# Patient Record
Sex: Female | Born: 1935 | Race: Black or African American | Hispanic: No | Smoking: Never smoker
Health system: Southern US, Community
[De-identification: ages and names within clinical notes are randomized; demographics above are authoritative.]

## PROBLEM LIST (undated history)

## (undated) DIAGNOSIS — K5790 Diverticulosis of intestine, part unspecified, without perforation or abscess without bleeding: Secondary | ICD-10-CM

## (undated) DIAGNOSIS — G629 Polyneuropathy, unspecified: Secondary | ICD-10-CM

## (undated) DIAGNOSIS — K2960 Other gastritis without bleeding: Secondary | ICD-10-CM

## (undated) DIAGNOSIS — N390 Urinary tract infection, site not specified: Secondary | ICD-10-CM

## (undated) DIAGNOSIS — I749 Embolism and thrombosis of unspecified artery: Secondary | ICD-10-CM

## (undated) DIAGNOSIS — I639 Cerebral infarction, unspecified: Secondary | ICD-10-CM

## (undated) DIAGNOSIS — F0150 Vascular dementia without behavioral disturbance: Secondary | ICD-10-CM

## (undated) DIAGNOSIS — R627 Adult failure to thrive: Secondary | ICD-10-CM

## (undated) DIAGNOSIS — F3289 Other specified depressive episodes: Secondary | ICD-10-CM

## (undated) DIAGNOSIS — R1311 Dysphagia, oral phase: Secondary | ICD-10-CM

## (undated) DIAGNOSIS — E1149 Type 2 diabetes mellitus with other diabetic neurological complication: Secondary | ICD-10-CM

## (undated) DIAGNOSIS — M6281 Muscle weakness (generalized): Secondary | ICD-10-CM

## (undated) DIAGNOSIS — I63532 Cerebral infarction due to unspecified occlusion or stenosis of left posterior cerebral artery: Secondary | ICD-10-CM

## (undated) DIAGNOSIS — K449 Diaphragmatic hernia without obstruction or gangrene: Secondary | ICD-10-CM

## (undated) DIAGNOSIS — R1314 Dysphagia, pharyngoesophageal phase: Secondary | ICD-10-CM

## (undated) DIAGNOSIS — I509 Heart failure, unspecified: Secondary | ICD-10-CM

## (undated) DIAGNOSIS — S065X9A Traumatic subdural hemorrhage with loss of consciousness of unspecified duration, initial encounter: Secondary | ICD-10-CM

## (undated) DIAGNOSIS — I10 Essential (primary) hypertension: Secondary | ICD-10-CM

## (undated) DIAGNOSIS — F329 Major depressive disorder, single episode, unspecified: Secondary | ICD-10-CM

## (undated) DIAGNOSIS — E782 Mixed hyperlipidemia: Secondary | ICD-10-CM

## (undated) DIAGNOSIS — I6789 Other cerebrovascular disease: Secondary | ICD-10-CM

## (undated) DIAGNOSIS — R4182 Altered mental status, unspecified: Secondary | ICD-10-CM

## (undated) DIAGNOSIS — H53469 Homonymous bilateral field defects, unspecified side: Secondary | ICD-10-CM

## (undated) DIAGNOSIS — D649 Anemia, unspecified: Secondary | ICD-10-CM

## (undated) HISTORY — PX: CHOLECYSTECTOMY: SHX55

## (undated) HISTORY — PX: TONSILLECTOMY: SUR1361

## (undated) HISTORY — PX: APPENDECTOMY: SHX54

## (undated) HISTORY — PX: ABDOMINAL HYSTERECTOMY: SHX81

---

## 1999-02-17 ENCOUNTER — Ambulatory Visit (HOSPITAL_COMMUNITY): Admission: EM | Admit: 1999-02-17 | Discharge: 1999-02-17 | Payer: Self-pay

## 2001-03-04 ENCOUNTER — Ambulatory Visit (HOSPITAL_COMMUNITY): Admission: RE | Admit: 2001-03-04 | Discharge: 2001-03-04 | Payer: Self-pay | Admitting: Family Medicine

## 2001-03-04 ENCOUNTER — Encounter: Payer: Self-pay | Admitting: Family Medicine

## 2001-05-06 ENCOUNTER — Ambulatory Visit (HOSPITAL_COMMUNITY): Admission: RE | Admit: 2001-05-06 | Discharge: 2001-05-06 | Payer: Self-pay | Admitting: Ophthalmology

## 2001-07-21 ENCOUNTER — Encounter: Payer: Self-pay | Admitting: Family Medicine

## 2001-07-21 ENCOUNTER — Ambulatory Visit (HOSPITAL_COMMUNITY): Admission: RE | Admit: 2001-07-21 | Discharge: 2001-07-21 | Payer: Self-pay | Admitting: Family Medicine

## 2001-07-23 ENCOUNTER — Ambulatory Visit (HOSPITAL_COMMUNITY): Admission: RE | Admit: 2001-07-23 | Discharge: 2001-07-23 | Payer: Self-pay | Admitting: Family Medicine

## 2001-07-23 ENCOUNTER — Encounter: Payer: Self-pay | Admitting: Family Medicine

## 2001-09-29 ENCOUNTER — Ambulatory Visit (HOSPITAL_COMMUNITY): Admission: RE | Admit: 2001-09-29 | Discharge: 2001-09-29 | Payer: Self-pay | Admitting: Family Medicine

## 2001-09-29 ENCOUNTER — Encounter: Payer: Self-pay | Admitting: Family Medicine

## 2001-11-22 ENCOUNTER — Encounter: Payer: Self-pay | Admitting: Emergency Medicine

## 2001-11-22 ENCOUNTER — Emergency Department (HOSPITAL_COMMUNITY): Admission: EM | Admit: 2001-11-22 | Discharge: 2001-11-22 | Payer: Self-pay | Admitting: Emergency Medicine

## 2002-05-31 ENCOUNTER — Encounter: Payer: Self-pay | Admitting: *Deleted

## 2002-05-31 ENCOUNTER — Emergency Department (HOSPITAL_COMMUNITY): Admission: EM | Admit: 2002-05-31 | Discharge: 2002-06-01 | Payer: Self-pay | Admitting: *Deleted

## 2002-08-19 ENCOUNTER — Encounter: Payer: Self-pay | Admitting: Internal Medicine

## 2002-08-19 ENCOUNTER — Ambulatory Visit (HOSPITAL_COMMUNITY): Admission: RE | Admit: 2002-08-19 | Discharge: 2002-08-19 | Payer: Self-pay | Admitting: Internal Medicine

## 2003-08-23 ENCOUNTER — Ambulatory Visit (HOSPITAL_COMMUNITY): Admission: RE | Admit: 2003-08-23 | Discharge: 2003-08-23 | Payer: Self-pay | Admitting: Family Medicine

## 2003-10-12 ENCOUNTER — Ambulatory Visit (HOSPITAL_COMMUNITY): Admission: RE | Admit: 2003-10-12 | Discharge: 2003-10-12 | Payer: Self-pay | Admitting: Family Medicine

## 2004-01-12 ENCOUNTER — Ambulatory Visit (HOSPITAL_COMMUNITY): Admission: RE | Admit: 2004-01-12 | Discharge: 2004-01-12 | Payer: Self-pay | Admitting: General Surgery

## 2004-02-28 ENCOUNTER — Ambulatory Visit (HOSPITAL_COMMUNITY): Admission: RE | Admit: 2004-02-28 | Discharge: 2004-02-28 | Payer: Self-pay | Admitting: Ophthalmology

## 2004-06-12 ENCOUNTER — Ambulatory Visit (HOSPITAL_COMMUNITY): Admission: RE | Admit: 2004-06-12 | Discharge: 2004-06-12 | Payer: Self-pay | Admitting: Family Medicine

## 2004-10-09 ENCOUNTER — Ambulatory Visit (HOSPITAL_COMMUNITY): Admission: RE | Admit: 2004-10-09 | Discharge: 2004-10-09 | Payer: Self-pay | Admitting: Family Medicine

## 2005-02-27 ENCOUNTER — Ambulatory Visit (HOSPITAL_COMMUNITY): Admission: RE | Admit: 2005-02-27 | Discharge: 2005-02-27 | Payer: Self-pay | Admitting: *Deleted

## 2005-04-03 ENCOUNTER — Ambulatory Visit (HOSPITAL_COMMUNITY): Admission: RE | Admit: 2005-04-03 | Discharge: 2005-04-03 | Payer: Self-pay | Admitting: General Surgery

## 2005-08-29 ENCOUNTER — Ambulatory Visit (HOSPITAL_COMMUNITY): Admission: RE | Admit: 2005-08-29 | Discharge: 2005-08-29 | Payer: Self-pay | Admitting: Family Medicine

## 2005-11-30 ENCOUNTER — Ambulatory Visit (HOSPITAL_COMMUNITY): Admission: RE | Admit: 2005-11-30 | Discharge: 2005-11-30 | Payer: Self-pay | Admitting: Family Medicine

## 2006-12-03 ENCOUNTER — Ambulatory Visit (HOSPITAL_COMMUNITY): Admission: RE | Admit: 2006-12-03 | Discharge: 2006-12-03 | Payer: Self-pay | Admitting: Family Medicine

## 2006-12-16 ENCOUNTER — Ambulatory Visit (HOSPITAL_COMMUNITY): Admission: RE | Admit: 2006-12-16 | Discharge: 2006-12-16 | Payer: Self-pay | Admitting: Family Medicine

## 2007-06-13 ENCOUNTER — Emergency Department (HOSPITAL_COMMUNITY): Admission: EM | Admit: 2007-06-13 | Discharge: 2007-06-13 | Payer: Self-pay | Admitting: Emergency Medicine

## 2007-08-11 ENCOUNTER — Inpatient Hospital Stay (HOSPITAL_COMMUNITY): Admission: EM | Admit: 2007-08-11 | Discharge: 2007-08-19 | Payer: Self-pay | Admitting: Emergency Medicine

## 2007-08-12 ENCOUNTER — Ambulatory Visit: Payer: Self-pay | Admitting: *Deleted

## 2007-08-12 ENCOUNTER — Encounter (INDEPENDENT_AMBULATORY_CARE_PROVIDER_SITE_OTHER): Payer: Self-pay | Admitting: Emergency Medicine

## 2007-08-14 ENCOUNTER — Encounter (INDEPENDENT_AMBULATORY_CARE_PROVIDER_SITE_OTHER): Payer: Self-pay | Admitting: Internal Medicine

## 2007-12-17 ENCOUNTER — Ambulatory Visit (HOSPITAL_COMMUNITY): Admission: RE | Admit: 2007-12-17 | Discharge: 2007-12-17 | Payer: Self-pay | Admitting: Family Medicine

## 2009-01-23 ENCOUNTER — Emergency Department (HOSPITAL_COMMUNITY): Admission: EM | Admit: 2009-01-23 | Discharge: 2009-01-23 | Payer: Self-pay | Admitting: Emergency Medicine

## 2009-04-13 ENCOUNTER — Ambulatory Visit (HOSPITAL_COMMUNITY): Admission: RE | Admit: 2009-04-13 | Discharge: 2009-04-13 | Payer: Self-pay | Admitting: Family Medicine

## 2009-05-30 ENCOUNTER — Emergency Department (HOSPITAL_COMMUNITY): Admission: EM | Admit: 2009-05-30 | Discharge: 2009-05-30 | Payer: Self-pay | Admitting: Emergency Medicine

## 2009-05-31 ENCOUNTER — Emergency Department (HOSPITAL_COMMUNITY): Admission: EM | Admit: 2009-05-31 | Discharge: 2009-06-01 | Payer: Self-pay | Admitting: Emergency Medicine

## 2010-04-28 ENCOUNTER — Emergency Department (HOSPITAL_COMMUNITY)
Admission: EM | Admit: 2010-04-28 | Discharge: 2010-04-28 | Payer: Self-pay | Source: Home / Self Care | Admitting: Emergency Medicine

## 2010-09-13 LAB — BASIC METABOLIC PANEL
CO2: 26 mEq/L (ref 19–32)
Calcium: 10.1 mg/dL (ref 8.4–10.5)
Creatinine, Ser: 0.81 mg/dL (ref 0.4–1.2)
GFR calc Af Amer: >60 mL/min (ref >60)
Glucose, Bld: 105 mg/dL — ABNORMAL HIGH (ref 70–99)

## 2010-09-13 LAB — CBC
MCH: 30.4 pg (ref 26.0–34.0)
MCHC: 33.3 g/dL (ref 30.0–36.0)
Platelets: 191 10*3/uL (ref 150–400)
RBC: 4.02 MIL/uL (ref 3.87–5.11)

## 2010-10-03 LAB — COMPREHENSIVE METABOLIC PANEL
AST: 23 U/L (ref 0–37)
Albumin: 4.3 g/dL (ref 3.5–5.2)
Calcium: 9.8 mg/dL (ref 8.4–10.5)
Creatinine, Ser: 0.87 mg/dL (ref 0.4–1.2)
GFR calc Af Amer: >60 mL/min (ref >60)
GFR calc non Af Amer: >60 mL/min (ref >60)

## 2010-10-03 LAB — CK TOTAL AND CKMB (NOT AT ARMC)
CK, MB: 1.6 ng/mL (ref 0.3–4.0)
Relative Index: INVALID (ref 0.0–2.5)
Total CK: 97 U/L (ref 7–177)

## 2010-10-03 LAB — URINE MICROSCOPIC-ADD ON

## 2010-10-03 LAB — CBC
Platelets: 180 10*3/uL (ref 150–400)
WBC: 5.7 10*3/uL (ref 4.0–10.5)

## 2010-10-03 LAB — URINALYSIS, ROUTINE W REFLEX MICROSCOPIC
Glucose, UA: NEGATIVE mg/dL
Leukocytes, UA: NEGATIVE
Nitrite: POSITIVE — AB
Protein, ur: NEGATIVE mg/dL

## 2010-10-03 LAB — URINE CULTURE
Colony Count: NO GROWTH
Culture: NO GROWTH

## 2010-10-03 LAB — DIFFERENTIAL
Eosinophils Relative: 2 % (ref 0–5)
Lymphocytes Relative: 28 % (ref 12–46)
Lymphs Abs: 1.6 10*3/uL (ref 0.7–4.0)
Monocytes Absolute: 0.5 10*3/uL (ref 0.1–1.0)

## 2010-10-03 LAB — SEDIMENTATION RATE: Sed Rate: 15 mm/hr (ref 0–22)

## 2010-10-04 LAB — HEPATIC FUNCTION PANEL
ALT: 13 U/L (ref 0–35)
Bilirubin, Direct: 0.1 mg/dL (ref 0.0–0.3)
Indirect Bilirubin: 0.6 mg/dL (ref 0.3–0.9)

## 2010-10-04 LAB — CBC
HCT: 39 % (ref 36.0–46.0)
Hemoglobin: 13 g/dL (ref 12.0–15.0)
MCHC: 33.2 g/dL (ref 30.0–36.0)
MCV: 91.8 fL (ref 78.0–100.0)
RBC: 4.25 MIL/uL (ref 3.87–5.11)
RDW: 13.9 % (ref 11.5–15.5)
WBC: 4.5 10*3/uL (ref 4.0–10.5)

## 2010-10-04 LAB — URINALYSIS, ROUTINE W REFLEX MICROSCOPIC
Bilirubin Urine: NEGATIVE
Nitrite: NEGATIVE
Specific Gravity, Urine: 1.015 (ref 1.005–1.030)
Urobilinogen, UA: 0.2 mg/dL (ref 0.0–1.0)

## 2010-10-04 LAB — DIFFERENTIAL
Basophils Absolute: 0 10*3/uL (ref 0.0–0.1)
Basophils Relative: 1 % (ref 0–1)
Eosinophils Absolute: 0.1 10*3/uL (ref 0.0–0.7)
Eosinophils Relative: 3 % (ref 0–5)
Monocytes Absolute: 0.4 10*3/uL (ref 0.1–1.0)

## 2010-10-04 LAB — BASIC METABOLIC PANEL
Calcium: 9.9 mg/dL (ref 8.4–10.5)
Chloride: 103 mEq/L (ref 96–112)
Creatinine, Ser: 0.76 mg/dL (ref 0.4–1.2)
GFR calc Af Amer: >60 mL/min (ref >60)
GFR calc non Af Amer: >60 mL/min (ref >60)

## 2010-10-08 LAB — URINALYSIS, ROUTINE W REFLEX MICROSCOPIC
Hgb urine dipstick: NEGATIVE
Nitrite: NEGATIVE
Specific Gravity, Urine: 1.01 (ref 1.005–1.030)
Urobilinogen, UA: 0.2 mg/dL (ref 0.0–1.0)
pH: 6 (ref 5.0–8.0)

## 2010-10-08 LAB — COMPREHENSIVE METABOLIC PANEL
ALT: 12 U/L (ref 0–35)
AST: 23 U/L (ref 0–37)
CO2: 24 mEq/L (ref 19–32)
Calcium: 9.3 mg/dL (ref 8.4–10.5)
GFR calc Af Amer: 55 mL/min — ABNORMAL LOW (ref >60)
Sodium: 135 mEq/L (ref 135–145)
Total Protein: 6.9 g/dL (ref 6.0–8.3)

## 2010-10-08 LAB — DIFFERENTIAL
Eosinophils Absolute: 0.1 10*3/uL (ref 0.0–0.7)
Eosinophils Relative: 1 % (ref 0–5)
Lymphs Abs: 0.7 10*3/uL (ref 0.7–4.0)
Monocytes Absolute: 0.5 10*3/uL (ref 0.1–1.0)
Monocytes Relative: 9 % (ref 3–12)

## 2010-10-08 LAB — LIPASE, BLOOD: Lipase: 11 U/L (ref 11–59)

## 2010-10-08 LAB — CBC
MCHC: 34.5 g/dL (ref 30.0–36.0)
RBC: 3.83 MIL/uL — ABNORMAL LOW (ref 3.87–5.11)
RDW: 14.5 % (ref 11.5–15.5)

## 2010-11-14 NOTE — H&P (Signed)
[name], MAST NO.:  [id number]   MEDICAL RECORD NO.:  [id number]          PATIENT TYPE:  EMS   LOCATION:  ED                            FACILITY:  [facility]   PHYSICIAN:  Renee Ramus, MD       DATE OF BIRTH:  [birth year]   DATE OF ADMISSION:  01/23/2009  DATE OF DISCHARGE:  07/25/2010LH                              HISTORY & PHYSICAL   HISTORY OF PRESENT ILLNESS:  The patient is an 77 year old female, who  experienced a syncopal episode or near syncopal episode 1 day prior to  admission.  The patient has been experiencing diarrhea for approximately  1-2 weeks prior to admission.  The patient has had decreased p.o. intake  and she is dehydrated upon being seen in the emergency department.  The  patient denies fevers, chills, night sweats, nausea, vomiting, chest  pain, shortness breath, PND, or orthopnea.  The patient has received IV  fluids and now feels stable for discharge.   PAST MEDICAL HISTORY:  1. Diabetes mellitus type 2.  2. Stroke history.  3. Hypertension.  4. Neuropathy.  5. Anemia.  6. Vertigo.  7. Esophageal reflux disease.  8. Hyperlipidemia.   SOCIAL HISTORY:  The patient denies alcohol or tobacco use.   FAMILY HISTORY:  Not available.   REVIEW OF SYSTEMS:  All other comprehensive review systems are negative.   ALLERGIES:  The patient is allergic to CODEINE and PENICILLIN.   CURRENT MEDICATIONS:  1. Prilosec 20 mg p.o. daily.  2. Meclizine 25 mg p.o. q.6 h. p.r.n. vertigo.  3. Altace 2.5 mg p.o. daily.  4. Lomotil 4 mg p.o. p.r.n. loose stool.  5. Actos 30 mg p.o. daily.  6. Aggrenox 1 tablet p.o. b.i.d.  7. Librax p.o. p.r.n. stools.  8. Gabapentin 300 mg p.o. nightly.   PHYSICAL EXAMINATION:  GENERAL:  Well-developed, well-nourished, elderly  white female, currently in no apparent distress.  VITAL SIGNS:  Blood pressure 112/42, heart rate 65, respiratory 22,  temperature 97.4 with 100% saturated on room air.  HEENT:  No  jugular venous distention or lymphadenopathy.  Oropharynx is  clear.  Mucous membranes are pink and moist.  TMs clear bilaterally.  Pupils are equal, reactive to light and accommodation.  Extraocular  muscles are intact.  CARDIOVASCULAR:  Regular rate and rhythm without murmurs, rubs, or  gallops.  PULMONARY:  Lungs are clear to auscultation bilaterally.  ABDOMEN:  Soft, nontender, nondistended without hepatosplenomegaly.  Bowel sounds present.  She has no rebound or guarding.  EXTREMITIES:  She has no clubbing, cyanosis, or edema.  She has good peripheral pulses  in dorsalis pedis and radial arteries.  She is able to move all  extremities.  NEUROLOGIC:  Cranial nerves II-XII are grossly intact.  She has no focal  neurological deficits.   STUDIES:  1. EKG shows normal sinus rhythm.  2. CT of the head shows no acute disease.  3. Plain film of the sacral and coccyx shows no acute fractures or      disease.  4. Lumbar spine film shows no acute fractures.   LABORATORY  DATA:  White count 5.6, H and H 11.8 and 34.2, MCV 89, and  platelets 150.  Sodium 136, potassium 3.3, chloride 103, bicarb 24, BUN  14, creatinine 1.14 with baseline creatinine of 0.6, and glucose 109.  UA is clear.   ASSESSMENT AND PLAN:  1. Near syncopal episode likely the patient is suffering prerenal      dehydration from decreased p.o. intake and exemplified by her      increased creatinine.  The patient has been has received fluid.      She is now stable.  This has been precipitated by her diarrhea,      which we do not believe is infectious.  We will ask her to stop      taking her Actos, Altace, and Protonix, but continue Aggrenox and      Neurontin and Lomotil.  We asked her to follow up with primary care      physician within 1-2 weeks.  2. Diabetes mellitus as above.  3. Hypertension.  We will hold Altace currently.  4. Gastroesophageal reflux disease.  We will hold Protonix.  5. Stroke history.  We will  continue with Aggrenox therapy.  6. Dehydration, now resolved.  7. Hyperlipidemia, currently stable.   DISPOSITION:  We believe the patient is stable for discharge and will be  returned to home.  H and P was constructed by reviewing past medical  history, conferring with emergency medical room physician, reviewing the  emergency medical record.   TIME SPENT:  One hour.      Renee Ramus, MD  Electronically Signed     JF/MEDQ  D:  01/23/2009  T:  01/24/2009  Job:  [id number]   cc:   Patrica Duel, M.D.  Fax: [phone]

## 2010-11-14 NOTE — Discharge Summary (Signed)
[name], [name] NO.:  [id number]   MEDICAL RECORD NO.:  [id number]          PATIENT TYPE:  INP   LOCATION:  6735                         FACILITY:  [facility]   PHYSICIAN:  Herbie Saxon, MDDATE OF BIRTH:  [birth year]   DATE OF ADMISSION:  08/11/2007  DATE OF DISCHARGE:                               DISCHARGE SUMMARY   PRIMARY CARE PHYSICIAN:  Patrica Duel, M.D., [facility].   RADIOLOGY:  CT of the brain of August 11, 2007 showed no acute  intracranial abnormality.  The MRI of the brain of August 11, 2007  shows a 2-3 mm acute stroke in the left posterior upper pons, negative  left superior cerebellarpeduncle.  Extensive chronic small vessel  changes elsewhere in the pons, the thalamus, and the hemispheric white  matter.   DISCHARGE DIAGNOSES:  1. Acute pontine cerebrovascular accident.  2. Hypertension, stable.  3. Diabetes, stable.  4. Unstable gait.  5. Urinary tract infection.  6. Hyperlipidemia.  7. Degenerative disk disease, bilateral hips.  8. Chronic anemia.  9. History of diverticulosis.  10.Syncopal episode.  11.Urine culture was positive for Proteus.   HOSPITAL COURSE:  Patient is an 77 year old African-American lady,  passed out while visiting her son in the intensive care unit at [facility].  She was worked up for syncope, and the MRI brain did  show an acute pontine infarct.  Patient also had evidence of urinary  tract infection on presentation.  She was started on IV Rocephin.  Her  carotid Doppler showed only 40-60% ICA stenosis.  Patient had been  ambulating with physical therapy support.  Urine cultures elicited  showed Proteus greater than 100,000 colonies.  Hip and low back pain has  been improved with p.r.n. analgesia and heating pad.  Echocardiogram  showed no embolic source of the CVA.   DISCHARGE CONDITION:  Stable.   DISPOSITION:  To the nursing facility in [facility]   ACTIVITY:  To be increased slowly.  Walk with assistance or with the  walker.   DIET:  Low sodium, heart-healthy, low cholesterol, 1800-calorie ADA.   FOLLOW UP:  1. With the nursing home physician or Dr. Patrica Duel of [facility] in a week.  2. Follow up with the neurologist, Dr. Vickey Huger, as an outpatient in 3-      4 weeks.   DISCHARGE MEDICATIONS:  1. Aggrenox 1 tablet b.i.d.  2. Lipitor 20 mg daily.  3. Cipro 500 mg b.i.d. for 3 more days.  4. Percocet 5/325 mg 1 q.6h. p.r.n.  5. Flexeril 80 mg p.o. t.i.d. p.r.n.  6. Maalox 30 ml q.6h. p.r.n.  7. Prilosec 20 mg daily.  8. Accu-Cheks q.a.c. and nightly.  9. Sliding scale insulin NovoLog coverage, sensitivity was pretty low      scale,  10.Altace 2.5 mg alt. daily.  Hold if blood pressure is less than      120/80.  11.Actos 15 mg alt.  daily.  Hold if blood glucose is less than 80      mg/dl.   PHYSICAL EXAMINATION:  This is an elderly lady not in acute distress.  Temperature 98, pulse 68, respiratory rate 20, blood pressure 117/58.  Pupils are equal, round and reactive to light and accommodation.  Head  is normocephalic and atraumatic.  She is mildly pale, not jaundiced.  Oropharynx and nasopharynx are clear.  There is no elevated JVD or carotid bruits.  No thyromegaly.  Heart sounds S1 and S2, regular.  CHEST:  Clinically clear.  ABDOMEN:  Benign.  She is alert and oriented x3.  Power is 4+ globally.  Deep tendon  reflexes are 2+ globally.  Peripheral pulses are present.  No peripheral  edema.  Cranial nerves II-XII are intact.  No facial asymmetry.  No  tremors, no dysarthria.   AVAILABLE LABS:  WBC 5, hematocrit 34, platelet count 157.   Echocardiogram shows a ejection fraction of 50% to 60%.   Hemoglobin A1C 6.4.  Sodium 141, potassium 3.5, chloride 109,  bicarbonate 24, glucose 86, BUN 4, creatinine 0.6.  Homocysteine is 7.9.  The total cholesterol is 215,  triglycerides 95, HDL 34, LDL  cholesterol  536.      Herbie Saxon, MD  Electronically Signed     MIO/MEDQ  D:  08/18/2007  T:  08/19/2007  Job:  [id number]   cc:   Melvyn Novas, M.D.  Patrica Duel, M.D.

## 2010-11-14 NOTE — H&P (Signed)
[name], [name] NO.:  [id number]   MEDICAL RECORD NO.:  [id number]          PATIENT TYPE:  INP   LOCATION:  1824                         FACILITY:  [facility]   PHYSICIAN:  Della Goo, M.D. DATE OF BIRTH:  [birth year]   DATE OF ADMISSION:  08/11/2007  DATE OF DISCHARGE:                              HISTORY & PHYSICAL   PRIMARY CARE PHYSICIAN:  Unassigned.   CHIEF COMPLAINT:  Passed out.   HISTORY OF PRESENT ILLNESS:  This is an 77 year old female who was seen  in the emergency department after she passed out while visiting her son  in the intensive care unit at [facility].  She was brought down  to the emergency department for further evaluation.  The patient's  daughter is at bedside and reports that the patient was seated and then  leaned back and was unresponsive.  This only lasted a few seconds.  The  patient denies having any chest pain, shortness of breath.  She also  denies having any fever, chills.   PAST MEDICAL HISTORY:  1. Hypertension.  2. Diabetes type 2.  3. Diverticulitis.   PAST SURGICAL HISTORY:  1. Cholecystectomy.  2. Total abdominal hysterectomy.  3. Bladder tacking times three.   MEDICATIONS:  Need to be further verified, they are unable to give the  dosages.  She is on Benicar, Actos, Prilosec and aspirin therapy.   ALLERGIES:  CODEINE AND PENICILLIN.   SOCIAL HISTORY:  She lives alone, reports being able to perform her  daily activities of life.  Nonsmoker, nondrinker.   FAMILY HISTORY:  Noncontributory.   PHYSICAL EXAMINATION:  GENERAL:  This is an 77 year old thin, well-  developed female in no acute distress currently.  VITAL SIGNS:  Temperature 97.0, blood pressure 143/67, heart rate 57-62,  respirations 23.  O2 saturation 97-100%.  HEENT:  Normocephalic, atraumatic.  Pupils equal, round and reactive to  light.  Extraocular movements intact.  Funduscopic benign.  Oropharynx  is clear.  NECK:  Supple.   Full range of motion.  No thyromegaly, adenopathy,  jugular venous distention.  CARDIOVASCULAR:  Mild bradycardia.  No murmurs, gallops, rubs.  LUNGS:  Clear to auscultation bilaterally.  ABDOMEN:  Positive bowel sounds, soft, nontender, nondistended.  EXTREMITIES:  Without cyanosis, clubbing or edema.  NEUROLOGICAL:  Alert and oriented x3.  Mild confusion, however.  Speech  is clear.  There are no cranial nerve deficits.  No motor or sensory  function deficits.   LABORATORY DATA:  White blood cell count 7.2, hemoglobin 11.5,  hematocrit 34.5, platelets 184,000.  Neutrophils 40%, lymphocytes 48%.  Sodium 136, potassium 3.8, chloride 104, bicarbonate 24.4, BUN 7,  creatinine 0.9, glucose 131.  Cardiac enzymes with a myoglobin of 30.4.  CK MB less than 1.0 and troponin less than 0.05.  Urine drug screen  performed, and this returned positive for benzodiazepines.  However, the  patient denies any history of taking any nerve or sleep medication, so  the urine drug screens will be repeated.  Urinalysis positive for  leukocytes and for nitrites.  CT scan of the head negative for any  acute  findings.   ASSESSMENT:  An 77 year old female being admitted with:  1. Syncope/altered mental status.  2. Urinary tract infection.  3. Hypertension.  4. Mild anemia.   PLAN:  The patient will be admitted to the telemetry area for cardiac  monitoring and cardiac enzymes will be continued.  The patient will be  placed on IV antibiotic therapy for her urinary tract infection.  Cipro  has been started.  Her home medications will be further verified.  A  syncope workup will also be initiated.  A TSH has been ordered, and an  MRI and carotid ultrasound study has been ordered.  Further workup will  ensue patient's critical condition and progress.  Her symptoms may have  been secondary to a urinary tract infection.  The UT and GI prophylaxis  have been ordered.      Della Goo, M.D.   Electronically Signed     HJ/MEDQ  D:  08/11/2007  T:  08/11/2007  Job:  [id number]

## 2010-11-17 NOTE — Procedures (Signed)
[facility]  Patient:    [name], [name] Visit Number: [id number] MRN: [id number]          Service Type: Attending:  Kari Baars, M.D. Dictated by:   Kari Baars, M.D. Proc. Date: 11/22/01                            EKG Interpretations  TIME:  2110  INTERPRETATION:  The rhythm is a sinus rhythm with a rate in the 60s.  The axis is leftward but does not meet criteria for left axis deviation.  There is generally low voltage throughout the tracing.  IMPRESSION:  Abnormal electrocardiogram. Dictated by:   Kari Baars, M.D. Attending:  Kari Baars, M.D. DD:  12/21/01 TD:  01/23/02 Job: [id number]

## 2010-11-17 NOTE — Op Note (Signed)
NAME:  [name], [name]                       ACCOUNT NO.:  [id number]   MEDICAL RECORD NO.:  [id number]                   PATIENT TYPE:  AMB   LOCATION:  DAY                                  FACILITY:  [facility]   PHYSICIAN:  Dalia Heading, M.D.               DATE OF BIRTH:  February 11, 1923   DATE OF PROCEDURE:  01/12/2004  DATE OF DISCHARGE:                                 OPERATIVE REPORT   PREOPERATIVE DIAGNOSES:  1. Skin nevus, right breast.  2. Pilonidal cyst.   POSTOPERATIVE DIAGNOSES:  1. Skin nevus, right breast.  2. Pilonidal cyst.   PROCEDURE:  Excision of pilonidal cyst, right breast biopsy.   SURGEON:  Dalia Heading, M.D.   ANESTHESIA:  MAC.   INDICATIONS FOR PROCEDURE:  The patient is a 77 year old black female with  multiple medical problems who presents with a skin nevus on the right breast  which has been enlarging and bleeding.  In addition, she has a painful  pilonidal cyst present.  The risks and benefits of both procedures were  fully explained to the patient who gave informed consent.   DESCRIPTION OF PROCEDURE:  The patient was placed in the left lateral  decubitus position.  Xylocaine 1% was used for local anesthesia.  Surgical  site confirmation was performed.   An elliptical incision was made around the pilonidal cyst in the coccyx  region.  This was taken down to the subcutaneous tissue.  This was excised  without difficulty.  The specimen was sent to pathology for further  examination.  Any bleeding was controlled using Bovie electrocautery.  The  skin was reapproximated using 4-0 nylon interrupted sutures.  Betadine  ointment and dry sterile dressings were then applied.   Next, the right breast was prepped and draped using the usual sterile  technique with Betadine.  The skin lesion was noted in the upper, outer  quadrant of the right breast.  Xylocaine 1% was used for local anesthesia.  An elliptical incision was made around the skin nevus.  This  was taken down  to the breast tissue.  The overlying skin as well as breast tissue was then  excised without difficulty.  This was sent to pathology for further  examination.  The skin was reapproximated using a 4-0 Vicryl subcuticular  suture.  Steri-Strips and a dry sterile dressing were applied.   All tape and needle counts were correct at the end of the procedure.  The  patient was awakened and transferred to the PACU in stable condition.   COMPLICATIONS:  None.   SPECIMENS:  1. Pilonidal cyst.  2. Skin nevus, right breast, and right breast tissue.   ESTIMATED BLOOD LOSS:  Minimal.      ___________________________________________  Dalia Heading, M.D.   MAJ/MEDQ  D:  01/12/2004  T:  01/12/2004  Job:  [id number]   cc:   Patrica Duel, M.D.  [address]  [place]  [place] [address]  Fax: [phone]

## 2010-11-17 NOTE — H&P (Signed)
[name], DETHLEFS NO.:  [id number]   MEDICAL RECORD NO.:  [id number]                  PATIENT TYPE:   LOCATION:                                       FACILITY:   PHYSICIAN:  Dalia Heading, M.D.               DATE OF BIRTH:  1923-06-08   DATE OF ADMISSION:  01/12/2004  DATE OF DISCHARGE:                                HISTORY & PHYSICAL   CHIEF COMPLAINT:  Non-healing skin lesion, right breast, and cyst over  coccyx.   HISTORY OF PRESENT ILLNESS:  The patient is a 77 year old black female who  was referred for evaluation and treatment of a non-healing skin lesion over  her right breast, and a cyst on her buttock region.  Both have been present  for some time, and do not want to resolve.  There is no family history of  breast carcinoma.  Mammogram earlier this year was negative for malignancy.  She does complain of pain when sitting from the cyst over her coccyx.   PAST MEDICAL HISTORY:  High cholesterol levels.   PAST SURGICAL HISTORY:  1. Cholecystectomy.  2. Hysterectomy.  3. CVA in 2000.   CURRENT MEDICATIONS:  1. Lotrel 20 mg p.o. daily.  2. Baby aspirin.  3. Prevacid.   ALLERGIES:  1. PENICILLIN.  2. CODEINE.   REVIEW OF SYSTEMS:  Noncontributory.  The patient denies any recent chest  pain, MI, CVA, diabetes mellitus, or bleeding disorders.   PHYSICAL EXAMINATION:  GENERAL:  The patient is a well-developed, well-  nourished, black female in no acute distress.  VITAL SIGNS:  She is afebrile, and vital signs are stable.  LUNGS:  Clear to auscultation with equal breath sounds bilaterally.  HEART:  Regular rate and rhythm without S3, S4, or murmurs.  BREASTS:  Left breast examination revealed no dominant mass, nipple  discharge, or dimpling.  The axilla is negative for palpable nodes.  Right  breast examination reveals a 1.5 cm non-healing, scabbed, pink, ovoid skin  lesion in the upper outer quadrant.  No dominant mass, nipple  discharge, or  dimpling is noted.  The axilla is negative for palpable nodes.  BACK:  A small tender cyst over the coccyx.   IMPRESSION:  1. Skin lesion, right breast, rule out basal cell carcinoma.  2. Cyst, buttock.   PLAN:  The patient is scheduled for excision of the skin lesion, right  breast, and of the cyst, coccyx, on January 12, 2004.  The risks and benefits  of the procedures including bleeding, infection, and recurrence, were fully  explained to the patient, who gave informed consent.     ___________________________________________                                         Dalia Heading, M.D.   MAJ/MEDQ  D:  12/30/2003  T:  12/30/2003  Job:  [id number]   cc:   Patrica Duel, M.D.  [address]  [place]  [place] [address]  Fax: [phone]

## 2010-11-17 NOTE — H&P (Signed)
[name], TEEM NO.:  [id number]   MEDICAL RECORD NO.:  [id number]          PATIENT TYPE:  AMB   LOCATION:                                FACILITY:  [facility]   PHYSICIAN:  Dalia Heading, M.D.  DATE OF BIRTH:  [birth year]   DATE OF ADMISSION:  DATE OF DISCHARGE:  LH                                HISTORY & PHYSICAL   CHIEF COMPLAINT:  Diverticulosis.   HISTORY OF PRESENT ILLNESS:  The patient is an 77 year old black female who  is referred for endoscopic evaluation. She needs a followup colonoscopy for  diverticulosis. She last had a colonoscopy in 2000 and was found to have pan  diverticulosis. She does complain of constipation. She denies any abdominal  pain, weight loss, nausea, vomiting, diarrhea, melena, hematochezia.   PAST MEDICAL HISTORY:  1.  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  2.  High cholesterol levels.  3.  Coronary artery disease.  4.  Hypertension.  5.  Noninsulin-dependent diabetes mellitus.  6.  Hiatal hernia.   PAST SURGICAL HISTORY:  1.  Right breast biopsy in 2005.  2.  Colonoscopy in 2000.  3.  Cholecystectomy.  4.  Hysterectomy.  5.  CVA in 2000.   CURRENT MEDICATIONS:  Zocor, Glucotrol, baby aspirin, Prevacid, Lotrel.   ALLERGIES:  PENICILLIN and CODEINE.   REVIEW OF SYSTEMS:  Unremarkable.   PHYSICAL EXAMINATION:  GENERAL:  The patient is a well-developed, well-  nourished, black female in no acute distress.  LUNGS:  Clear to auscultation with equal breath sounds bilaterally.  HEART:  Reveals a regular rate and rhythm without S3, S4, or murmurs.  ABDOMEN:  Soft, nontender, nondistended. No hepatosplenomegaly or masses are  noted.  RECTAL:  Deferred to the procedure.   IMPRESSION:  Diverticulosis.   PLAN:  The patient was scheduled for a colonoscopy on April 03, 2005. The  risks and benefits of the procedure including bleeding and perforation were  fully explained to the patient, who gave informed  consent.      Dalia Heading, M.D.  Electronically Signed     MAJ/MEDQ  D:  03/01/2005  T:  03/01/2005  Job:  [id number]   cc:   Patrica Duel, M.D.  Fax: [phone]   [facility]  Fax: [phone]

## 2011-01-09 ENCOUNTER — Other Ambulatory Visit (HOSPITAL_COMMUNITY): Payer: Self-pay | Admitting: Internal Medicine

## 2011-01-09 DIAGNOSIS — Z139 Encounter for screening, unspecified: Secondary | ICD-10-CM

## 2011-01-11 ENCOUNTER — Ambulatory Visit (HOSPITAL_COMMUNITY)
Admission: RE | Admit: 2011-01-11 | Discharge: 2011-01-11 | Disposition: A | Discharge status: Home / Self Care | Payer: Medicare Other | Source: Ambulatory Visit | Attending: Internal Medicine | Admitting: Internal Medicine

## 2011-01-11 DIAGNOSIS — Z139 Encounter for screening, unspecified: Secondary | ICD-10-CM

## 2011-01-11 DIAGNOSIS — Z1231 Encounter for screening mammogram for malignant neoplasm of breast: Secondary | ICD-10-CM | POA: Insufficient documentation

## 2011-03-23 LAB — URINE MICROSCOPIC-ADD ON

## 2011-03-23 LAB — CBC
HCT: 34.2 — ABNORMAL LOW
Hemoglobin: 11.5 — ABNORMAL LOW
Hemoglobin: 12.7
MCV: 88.6
Platelets: 178
RBC: 3.85 — ABNORMAL LOW
RBC: 3.86 — ABNORMAL LOW
RDW: 13.4
RDW: 13.6
WBC: 5.3
WBC: 5.8
WBC: 7.2

## 2011-03-23 LAB — MAGNESIUM
Magnesium: 2
Magnesium: 2.1

## 2011-03-23 LAB — COMPREHENSIVE METABOLIC PANEL WITH GFR
ALT: 14
AST: 19
Albumin: 3.6
Alkaline Phosphatase: 40
BUN: 4 — ABNORMAL LOW
CO2: 25
Calcium: 9.2
Chloride: 106
Creatinine, Ser: 0.72
GFR calc non Af Amer: >60
Glucose, Bld: 73
Potassium: 3.4 — ABNORMAL LOW
Sodium: 136
Total Bilirubin: 0.9
Total Protein: 6.5

## 2011-03-23 LAB — RAPID URINE DRUG SCREEN, HOSP PERFORMED
Barbiturates: NOT DETECTED
Benzodiazepines: POSITIVE — AB
Cocaine: NOT DETECTED
Cocaine: NOT DETECTED
Opiates: NOT DETECTED
Tetrahydrocannabinol: NOT DETECTED

## 2011-03-23 LAB — URINE CULTURE: Colony Count: >=100000

## 2011-03-23 LAB — I-STAT 8, (EC8 V) (CONVERTED LAB)
BUN: 7
Bicarbonate: 24.4 — ABNORMAL HIGH
Hemoglobin: 12.9
Operator id: 277751
pCO2, Ven: 39.6 — ABNORMAL LOW

## 2011-03-23 LAB — COMPREHENSIVE METABOLIC PANEL
AST: 22
AST: 24
Albumin: 3.6
Alkaline Phosphatase: 35 — ABNORMAL LOW
Alkaline Phosphatase: 45
CO2: 25
Chloride: 107
Chloride: 109
Creatinine, Ser: 0.61
GFR calc Af Amer: >60
GFR calc Af Amer: >60
GFR calc non Af Amer: >60
Potassium: 3.7
Total Bilirubin: 0.6
Total Bilirubin: 0.9

## 2011-03-23 LAB — BASIC METABOLIC PANEL
Calcium: 8.9
GFR calc Af Amer: >60
GFR calc non Af Amer: >60
Sodium: 136

## 2011-03-23 LAB — DIFFERENTIAL
Basophils Absolute: 0
Basophils Absolute: 0
Basophils Relative: 0
Eosinophils Relative: 3
Lymphocytes Relative: 27
Lymphocytes Relative: 48 — ABNORMAL HIGH
Lymphs Abs: 3.4
Monocytes Absolute: 0.5
Monocytes Absolute: 0.6
Monocytes Relative: 9
Neutro Abs: 2.9

## 2011-03-23 LAB — HOMOCYSTEINE: Homocysteine: 7.9

## 2011-03-23 LAB — URINALYSIS, ROUTINE W REFLEX MICROSCOPIC
Bilirubin Urine: NEGATIVE
Glucose, UA: NEGATIVE
Hgb urine dipstick: NEGATIVE
Ketones, ur: NEGATIVE
Specific Gravity, Urine: 1.014
pH: 7.5

## 2011-03-23 LAB — HEMOGLOBIN A1C: Mean Plasma Glucose: 147

## 2011-03-23 LAB — POCT I-STAT CREATININE: Operator id: 277751

## 2011-03-23 LAB — POCT CARDIAC MARKERS
CKMB, poc: <1 — ABNORMAL LOW
Troponin i, poc: <0.05

## 2011-03-23 LAB — CARDIAC PANEL(CRET KIN+CKTOT+MB+TROPI)
CK, MB: 0.7
Total CK: 61

## 2011-03-23 LAB — TROPONIN I
Troponin I: 0.01
Troponin I: 0.01
Troponin I: 0.02

## 2011-03-23 LAB — LIPID PANEL: HDL: 34 — ABNORMAL LOW

## 2011-03-23 LAB — CK TOTAL AND CKMB (NOT AT ARMC)
CK, MB: 1.1
Relative Index: INVALID
Total CK: 69

## 2011-03-23 LAB — PHOSPHORUS: Phosphorus: 3.6

## 2011-04-09 LAB — CBC
Hemoglobin: 13.1
MCHC: 32.8
RBC: 4.47
WBC: 5.2

## 2011-04-09 LAB — URINALYSIS, ROUTINE W REFLEX MICROSCOPIC
Hgb urine dipstick: NEGATIVE
Ketones, ur: NEGATIVE
Protein, ur: NEGATIVE
Urobilinogen, UA: 0.2

## 2011-04-09 LAB — DIFFERENTIAL
Basophils Relative: 0
Eosinophils Absolute: 0.1 — ABNORMAL LOW
Eosinophils Relative: 1
Neutrophils Relative %: 57

## 2011-04-09 LAB — COMPREHENSIVE METABOLIC PANEL
ALT: 19
AST: 27
Alkaline Phosphatase: 47
CO2: 29
Chloride: 104
GFR calc non Af Amer: >60
Glucose, Bld: 125 — ABNORMAL HIGH
Potassium: 3.8
Sodium: 140

## 2011-04-09 LAB — URINE MICROSCOPIC-ADD ON

## 2011-08-27 ENCOUNTER — Other Ambulatory Visit: Payer: Self-pay

## 2011-08-27 ENCOUNTER — Emergency Department (HOSPITAL_COMMUNITY): Payer: Medicare Other

## 2011-08-27 ENCOUNTER — Inpatient Hospital Stay (HOSPITAL_COMMUNITY)
Admission: EM | Admit: 2011-08-27 | Discharge: 2011-08-31 | DRG: 378 | Disposition: A | Discharge status: Home Health | Payer: Medicare Other | Attending: Internal Medicine | Admitting: Internal Medicine

## 2011-08-27 DIAGNOSIS — N390 Urinary tract infection, site not specified: Secondary | ICD-10-CM | POA: Diagnosis not present

## 2011-08-27 DIAGNOSIS — K625 Hemorrhage of anus and rectum: Secondary | ICD-10-CM | POA: Diagnosis present

## 2011-08-27 DIAGNOSIS — G629 Polyneuropathy, unspecified: Secondary | ICD-10-CM | POA: Diagnosis present

## 2011-08-27 DIAGNOSIS — K5731 Diverticulosis of large intestine without perforation or abscess with bleeding: Principal | ICD-10-CM | POA: Diagnosis present

## 2011-08-27 DIAGNOSIS — E1142 Type 2 diabetes mellitus with diabetic polyneuropathy: Secondary | ICD-10-CM | POA: Diagnosis present

## 2011-08-27 DIAGNOSIS — K922 Gastrointestinal hemorrhage, unspecified: Secondary | ICD-10-CM | POA: Diagnosis present

## 2011-08-27 DIAGNOSIS — M109 Gout, unspecified: Secondary | ICD-10-CM | POA: Diagnosis present

## 2011-08-27 DIAGNOSIS — K2960 Other gastritis without bleeding: Secondary | ICD-10-CM | POA: Diagnosis present

## 2011-08-27 DIAGNOSIS — D649 Anemia, unspecified: Secondary | ICD-10-CM

## 2011-08-27 DIAGNOSIS — R9431 Abnormal electrocardiogram [ECG] [EKG]: Secondary | ICD-10-CM | POA: Diagnosis present

## 2011-08-27 DIAGNOSIS — R5381 Other malaise: Secondary | ICD-10-CM | POA: Diagnosis not present

## 2011-08-27 DIAGNOSIS — R55 Syncope and collapse: Secondary | ICD-10-CM | POA: Diagnosis present

## 2011-08-27 DIAGNOSIS — D62 Acute posthemorrhagic anemia: Secondary | ICD-10-CM | POA: Diagnosis present

## 2011-08-27 DIAGNOSIS — K5790 Diverticulosis of intestine, part unspecified, without perforation or abscess without bleeding: Secondary | ICD-10-CM

## 2011-08-27 DIAGNOSIS — I10 Essential (primary) hypertension: Secondary | ICD-10-CM | POA: Diagnosis present

## 2011-08-27 DIAGNOSIS — R42 Dizziness and giddiness: Secondary | ICD-10-CM | POA: Diagnosis present

## 2011-08-27 DIAGNOSIS — K649 Unspecified hemorrhoids: Secondary | ICD-10-CM | POA: Diagnosis present

## 2011-08-27 DIAGNOSIS — Z8673 Personal history of transient ischemic attack (TIA), and cerebral infarction without residual deficits: Secondary | ICD-10-CM

## 2011-08-27 DIAGNOSIS — K2990 Gastroduodenitis, unspecified, without bleeding: Secondary | ICD-10-CM | POA: Diagnosis present

## 2011-08-27 DIAGNOSIS — D500 Iron deficiency anemia secondary to blood loss (chronic): Secondary | ICD-10-CM | POA: Diagnosis present

## 2011-08-27 DIAGNOSIS — Z79899 Other long term (current) drug therapy: Secondary | ICD-10-CM

## 2011-08-27 DIAGNOSIS — K449 Diaphragmatic hernia without obstruction or gangrene: Secondary | ICD-10-CM | POA: Diagnosis present

## 2011-08-27 DIAGNOSIS — E1149 Type 2 diabetes mellitus with other diabetic neurological complication: Secondary | ICD-10-CM | POA: Diagnosis present

## 2011-08-27 DIAGNOSIS — K2970 Gastritis, unspecified, without bleeding: Secondary | ICD-10-CM | POA: Diagnosis present

## 2011-08-27 DIAGNOSIS — E876 Hypokalemia: Secondary | ICD-10-CM | POA: Diagnosis not present

## 2011-08-27 DIAGNOSIS — R531 Weakness: Secondary | ICD-10-CM

## 2011-08-27 HISTORY — DX: Polyneuropathy, unspecified: G62.9

## 2011-08-27 HISTORY — DX: Other gastritis without bleeding: K29.60

## 2011-08-27 HISTORY — DX: Diaphragmatic hernia without obstruction or gangrene: K44.9

## 2011-08-27 HISTORY — DX: Essential (primary) hypertension: I10

## 2011-08-27 HISTORY — DX: Diverticulosis of intestine, part unspecified, without perforation or abscess without bleeding: K57.90

## 2011-08-27 HISTORY — DX: Cerebral infarction, unspecified: I63.9

## 2011-08-27 LAB — APTT: aPTT: 35 seconds (ref 24–37)

## 2011-08-27 LAB — COMPREHENSIVE METABOLIC PANEL
AST: 20 U/L (ref 0–37)
BUN: 21 mg/dL (ref 6–23)
CO2: 23 mEq/L (ref 19–32)
Calcium: 9.6 mg/dL (ref 8.4–10.5)
Chloride: 100 mEq/L (ref 96–112)
Creatinine, Ser: 1.02 mg/dL (ref 0.50–1.10)
GFR calc Af Amer: 56 mL/min — ABNORMAL LOW (ref >90)
GFR calc non Af Amer: 48 mL/min — ABNORMAL LOW (ref >90)
Glucose, Bld: 162 mg/dL — ABNORMAL HIGH (ref 70–99)
Total Bilirubin: 0.3 mg/dL (ref 0.3–1.2)

## 2011-08-27 LAB — URINALYSIS, ROUTINE W REFLEX MICROSCOPIC
Bilirubin Urine: NEGATIVE
Glucose, UA: NEGATIVE mg/dL
Hgb urine dipstick: NEGATIVE
Protein, ur: NEGATIVE mg/dL
Urobilinogen, UA: 0.2 mg/dL (ref 0.0–1.0)

## 2011-08-27 LAB — CK TOTAL AND CKMB (NOT AT ARMC): Relative Index: 2.7 — ABNORMAL HIGH (ref 0.0–2.5)

## 2011-08-27 LAB — CBC
HCT: 30.6 % — ABNORMAL LOW (ref 36.0–46.0)
Hemoglobin: 10.2 g/dL — ABNORMAL LOW (ref 12.0–15.0)
MCV: 86.2 fL (ref 78.0–100.0)
RDW: 13.7 % (ref 11.5–15.5)
WBC: 4.9 10*3/uL (ref 4.0–10.5)

## 2011-08-27 LAB — POCT I-STAT TROPONIN I: Troponin i, poc: 0 ng/mL (ref 0.00–0.08)

## 2011-08-27 LAB — DIFFERENTIAL
Basophils Absolute: 0 10*3/uL (ref 0.0–0.1)
Eosinophils Relative: 2 % (ref 0–5)
Lymphocytes Relative: 28 % (ref 12–46)
Monocytes Absolute: 0.5 10*3/uL (ref 0.1–1.0)
Monocytes Relative: 10 % (ref 3–12)
Neutro Abs: 2.9 10*3/uL (ref 1.7–7.7)

## 2011-08-27 MED ORDER — ASPIRIN 81 MG PO CHEW
324.0000 mg | CHEWABLE_TABLET | Freq: Once | ORAL | Status: AC
  Administered 2011-08-27: 324 mg via ORAL
  Filled 2011-08-27: qty 4

## 2011-08-27 MED ORDER — SODIUM CHLORIDE 0.9 % IV BOLUS (SEPSIS)
500.0000 mL | Freq: Once | INTRAVENOUS | Status: AC
  Administered 2011-08-27: 500 mL via INTRAVENOUS

## 2011-08-27 MED ORDER — SODIUM CHLORIDE 0.9 % IV SOLN
INTRAVENOUS | Status: DC
  Administered 2011-08-28: via INTRAVENOUS

## 2011-08-27 NOTE — ED Provider Notes (Signed)
History     CSN: [id number]  Arrival date & time 08/27/11  4098   First MD Initiated Contact with Patient 08/27/11 2037      Chief Complaint  Patient presents with  . Loss of Consciousness    (Consider location/radiation/quality/duration/timing/severity/associated sxs/prior treatment) HPI This 78 year old female lives alone and denies loss of consciousness or amnesia today but feels weaker than usual today, she has generalized weakness at baseline and walks with a cane because she cannot use a walker, she moves her slowly at baseline, today all day long she has felt weaker than usual and felt presyncopal tonight and had a bowel movement noticing a large amount of red blood in the toilet, she also had a nauseated feeling for several hours this evening as well without abdominal pain chest pain shortness of breath cough or fever. There is no altered mental status and no lateralizing weakness or numbness or incoordination. There is no trauma. There was no treatment prior to arrival. She feels generally weaker than her usual general weakness. She takes indomethacin chronically for chronic stable bilateral leg pain and takes Lyrica for that as well. Past Medical History  Diagnosis Date  . Stroke 2000  . Stroke 11914  . Diabetes mellitus   . Hypertension    Generalized weakness Past Surgical History  Procedure Date  . Abdominal hysterectomy   . Tonsillectomy   . Cholecystectomy   . Appendectomy     History reviewed. No pertinent family history.  History  Substance Use Topics  . Smoking status: Never Smoker   . Smokeless tobacco: Not on file  . Alcohol Use: No    OB History    Grav Para Term Preterm Abortions TAB SAB Ect Mult Living                  Review of Systems  Constitutional: Positive for fatigue. Negative for fever.       10 Systems reviewed and are negative for acute change except as noted in the HPI.  HENT: Negative for congestion.   Eyes: Negative for discharge  and redness.  Respiratory: Negative for cough and shortness of breath.   Cardiovascular: Negative for chest pain.  Gastrointestinal: Positive for nausea and blood in stool. Negative for vomiting and abdominal pain.  Musculoskeletal: Negative for back pain.  Skin: Negative for rash.  Neurological: Positive for weakness. Negative for syncope, numbness and headaches.  Psychiatric/Behavioral:       No behavior change.    Allergies  Review of patient's allergies indicates no known allergies.  Home Medications  No current outpatient prescriptions on file.  BP 138/72  Pulse 78  Temp(Src) 98.4 F (36.9 C) (Oral)  Resp 20  Ht 5\' 3"  (1.6 m)  Wt 132 lb 14.4 oz (60.283 kg)  BMI 23.54 kg/m2  SpO2 98%  Physical Exam  Nursing note and vitals reviewed. Constitutional: She is oriented to person, place, and time.       Awake, alert, nontoxic appearance.  HENT:  Head: Atraumatic.  Eyes: Right eye exhibits no discharge. Left eye exhibits no discharge.  Neck: Neck supple.  Pulmonary/Chest: Effort normal. She exhibits no tenderness.  Abdominal: Soft. There is no tenderness. There is no rebound.  Musculoskeletal: She exhibits no edema and no tenderness.       Baseline ROM, no obvious new focal weakness.  Neurological: She is alert and oriented to person, place, and time.       Mental status and motor strength appears baseline for  patient and situation.  Skin: No rash noted.  Psychiatric: She has a normal mood and affect.    ED Course  Procedures (including critical care time) A chaperone was present for an attempted anoscope examination with the patient having a combination of some dark brown stool but some maroon stool in the emergency department as well with a small amount of bright red blood (which I am unable to localize with limited anoscopic examination.[id number]  Pt states she would like to be a full code status. Labs Reviewed  CBC - Abnormal; Notable for the following:    RBC 3.55 (*)     Hemoglobin 10.2 (*)    HCT 30.6 (*)    All other components within normal limits  COMPREHENSIVE METABOLIC PANEL - Abnormal; Notable for the following:    Sodium 133 (*)    Glucose, Bld 162 (*)    GFR calc non Af Amer 48 (*)    GFR calc Af Amer 56 (*)    All other components within normal limits  CK TOTAL AND CKMB - Abnormal; Notable for the following:    CK, MB 4.2 (*)    Relative Index 2.7 (*)    All other components within normal limits  URINALYSIS, ROUTINE W REFLEX MICROSCOPIC - Abnormal; Notable for the following:    Ketones, ur TRACE (*)    All other components within normal limits  CBC - Abnormal; Notable for the following:    RBC 3.14 (*)    Hemoglobin 9.1 (*)    HCT 26.9 (*)    All other components within normal limits  BASIC METABOLIC PANEL - Abnormal; Notable for the following:    Glucose, Bld 123 (*)    GFR calc non Af Amer 74 (*)    GFR calc Af Amer 85 (*)    All other components within normal limits  HEMOGLOBIN AND HEMATOCRIT, BLOOD - Abnormal; Notable for the following:    Hemoglobin 10.9 (*)    HCT 32.2 (*)    All other components within normal limits  HEMOGLOBIN AND HEMATOCRIT, BLOOD - Abnormal; Notable for the following:    Hemoglobin 9.4 (*)    HCT 27.7 (*)    All other components within normal limits  HEMOGLOBIN AND HEMATOCRIT, BLOOD - Abnormal; Notable for the following:    Hemoglobin 9.5 (*)    HCT 28.5 (*)    All other components within normal limits  HEMOGLOBIN AND HEMATOCRIT, BLOOD - Abnormal; Notable for the following:    Hemoglobin 8.8 (*)    HCT 26.2 (*)    All other components within normal limits  GLUCOSE, CAPILLARY - Abnormal; Notable for the following:    Glucose-Capillary 220 (*)    All other components within normal limits  DIFFERENTIAL  PROTIME-INR  APTT  POCT I-STAT TROPONIN I  TYPE AND SCREEN  LIPASE, BLOOD  PROTIME-INR  GLUCOSE, CAPILLARY  HEMOGLOBIN AND HEMATOCRIT, BLOOD  CBC  BASIC METABOLIC PANEL  HEMOGLOBIN AND  HEMATOCRIT, BLOOD   Dg Chest 2 View  08/27/2011  *RADIOLOGY REPORT*  Clinical Data: Syncope  CHEST - 2 VIEW  Comparison: 06/01/2009  Findings: Vascular clips in the right upper abdomen.  Low lung volumes.  Lungs clear.  Heart size normal.  No effusion.  IMPRESSION:  1.  No acute disease  Original Report Authenticated By: Osa Craver, M.D.     1. Near syncope   2. Lower GI bleed   3. Anemia   4. Generalized weakness  MDM  The patient appears reasonably stabilized for admission considering the current resources, flow, and capabilities available in the ED at this time, and I doubt any other [facility] requiring further screening and/or treatment in the ED prior to admission.Patient / Family / Caregiver informed of clinical course, understand medical decision-making process, and agree with plan.d/w [facility] for admit.        Hurman Horn, MD 08/28/11 [phone]

## 2011-08-27 NOTE — ED Notes (Signed)
Pt at home with family who report pt had syncopal episode tonight.  Pt told ems "I am sick"

## 2011-08-27 NOTE — H&P (Signed)
PCP:   Colette Ribas, MD, MD   Chief Complaint:  Bright red blood per rectum and almost passed out  HPI 78 year old female who has been start a week most of the day and went to have a bowel movement where she had a large bright red bowel movement and she almost passed out felt very weak. She says she's been nauseated without any vomiting. She denies any abdominal pain or chest pain. She denies any shortness of breath. She's not had any further bleeding in the emergency department. Her baseline hemoglobin over a year ago was 12 and is now down to 10. She denies any prior bleeding from anywhere. She does take indomethacin every single day for unclear reasons. We are being asked to admit the patient for GI workup. She denies running any fevers.  Review of Systems:  Otherwise negative  Past Medical History: No past medical history on file. No past surgical history on file.  Medications: Prior to Admission medications   Medication Sig Start Date End Date Taking? Authorizing Provider  amitriptyline (ELAVIL) 25 MG tablet Take 25 mg by mouth at bedtime. FOR SLEEP   Yes Historical Provider, MD  clidinium-chlordiazePOXIDE (LIBRAX) 2.5-5 MG per capsule Take 1 capsule by mouth at bedtime as needed. FOR GAS   Yes Historical Provider, MD  dipyridamole-aspirin (AGGRENOX) 25-200 MG per 12 hr capsule Take 1 capsule by mouth 2 (two) times daily. FOR STROKE PREVENTION   Yes Historical Provider, MD  enalapril (VASOTEC) 2.5 MG tablet Take 2.5 mg by mouth daily. FOR BLOOD PRESSURE AND KIDNEY PROTECTION   Yes Historical Provider, MD  indomethacin (INDOCIN) 50 MG capsule Take 50 mg by mouth 3 (three) times daily with meals. FOR GOUT   Yes Historical Provider, MD  omeprazole (PRILOSEC OTC) 20 MG tablet Take 20 mg by mouth daily. FOR ACID REFLUX   Yes Historical Provider, MD  pioglitazone (ACTOS) 30 MG tablet Take 30 mg by mouth daily. FOR DIABETES   Yes Historical Provider, MD  pregabalin (LYRICA) 50 MG  capsule Take 50 mg by mouth 3 (three) times daily.   Yes Historical Provider, MD    Allergies:  Not on File  Social History:  does not have a smoking history on file. She does not have any smokeless tobacco history on file. Her alcohol and drug histories not on file.  Family History: No family history on file.  Physical Exam: Filed Vitals:   08/27/11 1924  BP: 152/59  Pulse: 78  Temp: 97.3 F (36.3 C)  TempSrc: Oral  Resp: 14  SpO2: 97%   BP 121/53  Pulse 78  Temp(Src) 97.3 F (36.3 C) (Oral)  Resp 20  SpO2 96% General appearance: alert, cooperative and no distress Lungs: clear to auscultation bilaterally Heart: regular rate and rhythm, S1, S2 normal, no murmur, click, rub or gallop Abdomen: soft, non-tender; bowel sounds normal; no masses,  no organomegaly Extremities: extremities normal, atraumatic, no cyanosis or edema Pulses: 2+ and symmetric Skin: Skin color, texture, turgor normal. No rashes or lesions Neurologic: Grossly normal    Labs on Admission:   [facility] 08/27/11 2020  NA 133*  K 3.7  CL 100  CO2 23  GLUCOSE 162*  BUN 21  CREATININE 1.02  CALCIUM 9.6  MG --  PHOS --    Basename 08/27/11 2020  AST 20  ALT 11  ALKPHOS 45  BILITOT 0.3  PROT 6.8  ALBUMIN 3.6    Basename 08/27/11 2055  LIPASE 12  AMYLASE --  Basename 08/27/11 2020  WBC 4.9  NEUTROABS 2.9  HGB 10.2*  HCT 30.6*  MCV 86.2  PLT 188    Basename 08/27/11 2020  CKTOTAL 158  CKMB 4.2*  CKMBINDEX --  TROPONINI --   Radiological Exams on Admission: Dg Chest 2 View  08/27/2011  *RADIOLOGY REPORT*  Clinical Data: Syncope  CHEST - 2 VIEW  Comparison: 06/01/2009  Findings: Vascular clips in the right upper abdomen.  Low lung volumes.  Lungs clear.  Heart size normal.  No effusion.  IMPRESSION:  1.  No acute disease  Original Report Authenticated By: Osa Craver, M.D.    Assessment/Plan Present on Admission:  78 year old female presents with presyncope  and bright red blood per rectum  .BRBPR (bright red blood per rectum) .Anemia .Dizzy  Lower GI bleed we'll place on IV fluids check her H&H every 6 hours and monitor for any drop in hemoglobin. We'll monitor closely for any recurrence of GI bleed. She was loaded with aspirin in the emergency department. I spoke to Dr. Doylene Canard who ordered aspirin and he was told by triage that the patient was chest pain patient and there was some noted EKG changes and he was not told that she was a GI bleeder. We'll hold any further aspirin products at this time. She is currently hemodynamically stable but will monitor closely. Obtain a GI consultation for recommendations for any need for endoscopic examination. Further recommendations depending on overall hospital course.   Soumya Colson [phone] 08/27/2011, 10:50 PM

## 2011-08-28 ENCOUNTER — Encounter (HOSPITAL_COMMUNITY)
Admission: EM | Disposition: A | Discharge status: Home Health | Payer: Self-pay | Source: Home / Self Care | Attending: Internal Medicine

## 2011-08-28 ENCOUNTER — Encounter (HOSPITAL_COMMUNITY): Payer: Self-pay

## 2011-08-28 DIAGNOSIS — K922 Gastrointestinal hemorrhage, unspecified: Secondary | ICD-10-CM

## 2011-08-28 DIAGNOSIS — K255 Chronic or unspecified gastric ulcer with perforation: Secondary | ICD-10-CM

## 2011-08-28 DIAGNOSIS — R1013 Epigastric pain: Secondary | ICD-10-CM

## 2011-08-28 DIAGNOSIS — K449 Diaphragmatic hernia without obstruction or gangrene: Secondary | ICD-10-CM

## 2011-08-28 HISTORY — PX: ESOPHAGOGASTRODUODENOSCOPY: SHX5428

## 2011-08-28 LAB — CBC
MCHC: 33.8 g/dL (ref 30.0–36.0)
MCV: 85.7 fL (ref 78.0–100.0)
Platelets: 192 10*3/uL (ref 150–400)
RDW: 13.9 % (ref 11.5–15.5)
WBC: 5.1 10*3/uL (ref 4.0–10.5)

## 2011-08-28 LAB — TYPE AND SCREEN
ABO/RH(D): O POS
Antibody Screen: NEGATIVE

## 2011-08-28 LAB — GLUCOSE, CAPILLARY: Glucose-Capillary: 220 mg/dL — ABNORMAL HIGH (ref 70–99)

## 2011-08-28 LAB — BASIC METABOLIC PANEL
BUN: 15 mg/dL (ref 6–23)
Chloride: 104 mEq/L (ref 96–112)
Creatinine, Ser: 0.78 mg/dL (ref 0.50–1.10)
GFR calc Af Amer: 85 mL/min — ABNORMAL LOW (ref >90)
GFR calc non Af Amer: 74 mL/min — ABNORMAL LOW (ref >90)

## 2011-08-28 LAB — HEMOGLOBIN AND HEMATOCRIT, BLOOD
HCT: 27.7 % — ABNORMAL LOW (ref 36.0–46.0)
HCT: 26.2 % — ABNORMAL LOW (ref 36.0–46.0)
Hemoglobin: 10.9 g/dL — ABNORMAL LOW (ref 12.0–15.0)
Hemoglobin: 8.8 g/dL — ABNORMAL LOW (ref 12.0–15.0)

## 2011-08-28 LAB — PROTIME-INR: INR: 1.1 (ref 0.00–1.49)

## 2011-08-28 SURGERY — EGD (ESOPHAGOGASTRODUODENOSCOPY)
Anesthesia: Moderate Sedation

## 2011-08-28 MED ORDER — OMEPRAZOLE MAGNESIUM 20 MG PO TBEC: 20.0000 mg | DELAYED_RELEASE_TABLET | Freq: Every day | ORAL | Status: DC

## 2011-08-28 MED ORDER — PEG 3350-KCL-NA BICARB-NACL 420 G PO SOLR
ORAL | Status: AC
  Filled 2011-08-28: qty 4000

## 2011-08-28 MED ORDER — ALUM & MAG HYDROXIDE-SIMETH 200-200-20 MG/5ML PO SUSP: 30.0000 mL | Freq: Four times a day (QID) | ORAL | Status: DC | PRN

## 2011-08-28 MED ORDER — SODIUM CHLORIDE 0.45 % IV SOLN
INTRAVENOUS | Status: DC
  Administered 2011-08-28: 14:00:00 via INTRAVENOUS

## 2011-08-28 MED ORDER — AMITRIPTYLINE HCL 25 MG PO TABS
25.0000 mg | ORAL_TABLET | Freq: Every day | ORAL | Status: DC
  Administered 2011-08-28 – 2011-08-30 (×3): 25 mg via ORAL
  Filled 2011-08-28 (×3): qty 1

## 2011-08-28 MED ORDER — ONDANSETRON HCL 4 MG/2ML IJ SOLN: 4.0000 mg | Freq: Four times a day (QID) | INTRAMUSCULAR | Status: DC | PRN

## 2011-08-28 MED ORDER — MIDAZOLAM HCL 5 MG/5ML IJ SOLN
INTRAMUSCULAR | Status: AC
  Filled 2011-08-28: qty 10

## 2011-08-28 MED ORDER — HYDROCODONE-ACETAMINOPHEN 5-325 MG PO TABS: 1.0000 | ORAL_TABLET | Freq: Four times a day (QID) | ORAL | Status: DC | PRN

## 2011-08-28 MED ORDER — POTASSIUM CHLORIDE IN NACL 20-0.9 MEQ/L-% IV SOLN
INTRAVENOUS | Status: AC
  Administered 2011-08-28: 02:00:00 via INTRAVENOUS

## 2011-08-28 MED ORDER — ACETAMINOPHEN 325 MG PO TABS
650.0000 mg | ORAL_TABLET | Freq: Four times a day (QID) | ORAL | Status: DC | PRN
  Administered 2011-08-30 (×2): 650 mg via ORAL
  Filled 2011-08-28 (×2): qty 2

## 2011-08-28 MED ORDER — ENALAPRIL MALEATE 5 MG PO TABS
2.5000 mg | ORAL_TABLET | Freq: Every day | ORAL | Status: DC
  Administered 2011-08-29: 2.5 mg via ORAL
  Filled 2011-08-28: qty 1

## 2011-08-28 MED ORDER — ONDANSETRON HCL 4 MG PO TABS: 4.0000 mg | ORAL_TABLET | Freq: Four times a day (QID) | ORAL | Status: DC | PRN

## 2011-08-28 MED ORDER — SODIUM CHLORIDE 0.9 % IV SOLN
INTRAVENOUS | Status: DC
  Administered 2011-08-28 – 2011-08-30 (×3): via INTRAVENOUS

## 2011-08-28 MED ORDER — PREGABALIN 50 MG PO CAPS
50.0000 mg | ORAL_CAPSULE | Freq: Three times a day (TID) | ORAL | Status: DC
  Administered 2011-08-28 – 2011-08-31 (×9): 50 mg via ORAL
  Filled 2011-08-28 (×9): qty 1

## 2011-08-28 MED ORDER — INSULIN ASPART 100 UNIT/ML ~~LOC~~ SOLN
0.0000 [IU] | Freq: Three times a day (TID) | SUBCUTANEOUS | Status: DC
  Administered 2011-08-30: 5 [IU] via SUBCUTANEOUS
  Filled 2011-08-28: qty 3

## 2011-08-28 MED ORDER — PEG 3350-KCL-NA BICARB-NACL 420 G PO SOLR
4000.0000 mL | Freq: Once | ORAL | Status: AC
  Administered 2011-08-29: 4000 mL via ORAL

## 2011-08-28 MED ORDER — PANTOPRAZOLE SODIUM 40 MG PO TBEC
40.0000 mg | DELAYED_RELEASE_TABLET | Freq: Every day | ORAL | Status: DC
  Administered 2011-08-29 – 2011-08-31 (×3): 40 mg via ORAL
  Filled 2011-08-28 (×3): qty 1

## 2011-08-28 MED ORDER — MEPERIDINE HCL 50 MG/ML IJ SOLN
INTRAMUSCULAR | Status: AC
  Filled 2011-08-28: qty 1

## 2011-08-28 NOTE — Progress Notes (Signed)
PROGRESS NOTE  [name] ZOX:[id number] DOB: 1922-11-13 DOA: 08/27/2011 PCP: Colette Ribas, MD, MD  Brief narrative: 78 year old woman presented to the emergency department with presyncope and bright red blood per rectum. She has been on indomethacin for gout for approximately 2 weeks.  ER documentation is incomplete at time of review.  Some question of cardiac ischemia was raised by the emergency department physician based on EKG findings although the patient did deny chest pain..  Past medical history: Stroke, diabetes mellitus, hypertension, gout  Consultants:  Gastroenterology  Procedures:  February 26: Upper endoscopy: Small sliding hiatal hernia. Her residential gastritis. No lesion found to account for patient's GI bleed.  February 27: Colonoscopy:  Interim History: Chart reviewed.  Subjective: Complains of headache and some right ankle pain. Did have blood with bowel movement this morning.  Objective: Filed Vitals:   08/28/11 0113 08/28/11 0159 08/28/11 0200 08/28/11 0541  BP: 169/89 165/80 171/92 142/84  Pulse: 80 78 83 70  Temp: 97.9 F (36.6 C)   98 F (36.7 C)  TempSrc: Oral   Oral  Resp: 20   20  Height:  5\' 3"  (1.6 m)    Weight:  60.283 kg (132 lb 14.4 oz)    SpO2: 100% 98%  100%    Intake/Output Summary (Last 24 hours) at 08/28/11 1323 Last data filed at 08/28/11 0800  Gross per 24 hour  Intake    240 ml  Output      0 ml  Net    240 ml    Exam:   General:  Appears calm and comfortable.  Cardiovascular: Regular rate and rhythm. No murmur, rub, gallop. No lower extremity edema.  Respiratory: Clear to auscultation bilaterally. No wheezes, rales, rhonchi. Normal respiratory effort.  Musculoskeletal: Right lobe showing appears grossly unremarkable. Minimal pain with palpation of lateral malleolus. No effusion noted.  Data Reviewed: Basic Metabolic Panel:  Lab 08/28/11 8119 08/27/11 2020  NA 135 133*  K 4.0 3.7  CL 104 100    CO2 24 23  GLUCOSE 123* 162*  BUN 15 21  CREATININE 0.78 1.02  CALCIUM 9.4 9.6  MG -- --  PHOS -- --   Liver Function Tests:  Lab 08/27/11 2020  AST 20  ALT 11  ALKPHOS 45  BILITOT 0.3  PROT 6.8  ALBUMIN 3.6    Lab 08/27/11 2055  LIPASE 12  AMYLASE --   CBC:  Lab 08/28/11 0457 08/28/11 0456 08/28/11 0227 08/27/11 2020  WBC -- 5.1 -- 4.9  NEUTROABS -- -- -- 2.9  HGB 9.4* 9.1* 10.9* 10.2*  HCT 27.7* 26.9* 32.2* 30.6*  MCV -- 85.7 -- 86.2  PLT -- 192 -- 188   Cardiac Enzymes:  Lab 08/27/11 2020  CKTOTAL 158  CKMB 4.2*  CKMBINDEX --  TROPONINI --   Studies: Dg Chest 2 View  08/27/2011  *RADIOLOGY REPORT*  Clinical Data: Syncope  CHEST - 2 VIEW  Comparison: 06/01/2009  Findings: Vascular clips in the right upper abdomen.  Low lung volumes.  Lungs clear.  Heart size normal.  No effusion.  IMPRESSION:  1.  No acute disease  Original Report Authenticated By: Thora Lance III, M.D.    Scheduled Meds:   . amitriptyline  25 mg Oral QHS  . aspirin  324 mg Oral Once  . enalapril  2.5 mg Oral Daily  . pantoprazole  40 mg Oral Q1200  . pregabalin  50 mg Oral TID  . sodium chloride  500 mL Intravenous  Once  . DISCONTD: omeprazole  20 mg Oral Daily   Continuous Infusions:   . 0.9 % NaCl with KCl 20 mEq / L 100 mL/hr at 08/28/11 0224  . DISCONTD: sodium chloride 125 mL/hr at 08/28/11 0009   EKG: Sinus rhythm. Anterolateral and inferior T wave inversion. Consider ischemia.  Assessment/Plan: 1. Lower GI bleed: Etiology unclear. However patient has been on Aggrenox. Has also been on indomethacin for 2 weeks. Upper endoscopy was unremarkable. Colonoscopy is planned for tomorrow. Hemoglobin has remained stable. 2. Presyncope: Secondary to the above. 3. Abnormal EKG: No complaints of chest pain. No evidence to suggest  coronary event.Troponin was negative. No previous EKG available for comparison purposes. No further evaluation suggested at this time. 4. Diabetes  mellitus type 2: Start sliding scale insulin. Resume Actos when eating. 5. History of stroke: Resume Aggrenox when able. 6. History of gout: Consider colchicine or prednisone in the future. Avoid NSAIDs.   Code Status: Full code. Family Communication: Discussed with family at bedside Disposition Plan: Home when improved.   Brendia Sacks, MD  [facility] Pager [phone] 08/28/2011, 1:23 PM    LOS: 1 day

## 2011-08-28 NOTE — Consult Note (Signed)
Reason for Consult: Lower GI bleed Referring Physician: [name] is an 78 y.o. female.  HPI: [name] is a 78 yr old female admitted thru the ED yesterday.   She tells me she had bright red rectal bleeding.  She says she was weak and felt bad. She had been taking Indocin x 2 weeks for her gout.  She say she became dizzy and thought she was going to faint.  Since admission she says she has not had any more rectal bleeding. She did receive ASA  325mg  in the ED as part of a Cardiac workup.  She denies chest pain.  She denies prior hx of rectal bleeding.  Hemoglobin on admission 10.2  Hemoglobin today 10.9. Appetite has been good. No weight loss. No abdominal pain. She usually has a BM about one every other day.  She gives a hx of having diverticulitis in the past.   Past Medical History  Diagnosis Date  . Stroke 2000  . Stroke 40981  . Diabetes mellitus   . Hypertension     Past Surgical History  Procedure Date  . Abdominal hysterectomy   . Tonsillectomy   . Cholecystectomy   . Appendectomy     History reviewed. No pertinent family history.  Social History:  reports that she has never smoked. She does not have any smokeless tobacco history on file. She reports that she does not drink alcohol or use illicit drugs.  Allergies: No Known Allergies  Medications: I have reviewed the patient's current medications.  Results for orders placed during the hospital encounter of 08/27/11 (from the past 48 hour(s))  CBC     Status: Abnormal   Collection Time   08/27/11  8:20 PM      Component Value Range Comment   WBC 4.9  4.0 - 10.5 (K/uL)    RBC 3.55 (*) 3.87 - 5.11 (MIL/uL)    Hemoglobin 10.2 (*) 12.0 - 15.0 (g/dL)    HCT 19.1 (*) 47.8 - 46.0 (%)    MCV 86.2  78.0 - 100.0 (fL)    MCH 28.7  26.0 - 34.0 (pg)    MCHC 33.3  30.0 - 36.0 (g/dL)    RDW 29.5  62.1 - 30.8 (%)    Platelets 188  150 - 400 (K/uL)   DIFFERENTIAL     Status: Normal   Collection Time   08/27/11   8:20 PM      Component Value Range Comment   Neutrophils Relative 60  43 - 77 (%)    Neutro Abs 2.9  1.7 - 7.7 (K/uL)    Lymphocytes Relative 28  12 - 46 (%)    Lymphs Abs 1.4  0.7 - 4.0 (K/uL)    Monocytes Relative 10  3 - 12 (%)    Monocytes Absolute 0.5  0.1 - 1.0 (K/uL)    Eosinophils Relative 2  0 - 5 (%)    Eosinophils Absolute 0.1  0.0 - 0.7 (K/uL)    Basophils Relative 0  0 - 1 (%)    Basophils Absolute 0.0  0.0 - 0.1 (K/uL)   COMPREHENSIVE METABOLIC PANEL     Status: Abnormal   Collection Time   08/27/11  8:20 PM      Component Value Range Comment   Sodium 133 (*) 135 - 145 (mEq/L)    Potassium 3.7  3.5 - 5.1 (mEq/L)    Chloride 100  96 - 112 (mEq/L)    CO2 23  19 -  32 (mEq/L)    Glucose, Bld 162 (*) 70 - 99 (mg/dL)    BUN 21  6 - 23 (mg/dL)    Creatinine, Ser 2.13  0.50 - 1.10 (mg/dL)    Calcium 9.6  8.4 - 10.5 (mg/dL)    Total Protein 6.8  6.0 - 8.3 (g/dL)    Albumin 3.6  3.5 - 5.2 (g/dL)    AST 20  0 - 37 (U/L)    ALT 11  0 - 35 (U/L)    Alkaline Phosphatase 45  39 - 117 (U/L)    Total Bilirubin 0.3  0.3 - 1.2 (mg/dL)    GFR calc non Af Amer 48 (*) >90 (mL/min)    GFR calc Af Amer 56 (*) >90 (mL/min)   CK TOTAL AND CKMB     Status: Abnormal   Collection Time   08/27/11  8:20 PM      Component Value Range Comment   Total CK 158  7 - 177 (U/L)    CK, MB 4.2 (*) 0.3 - 4.0 (ng/mL)    Relative Index 2.7 (*) 0.0 - 2.5    PROTIME-INR     Status: Normal   Collection Time   08/27/11  8:20 PM      Component Value Range Comment   Prothrombin Time 14.9  11.6 - 15.2 (seconds)    INR 1.15  0.00 - 1.49    APTT     Status: Normal   Collection Time   08/27/11  8:20 PM      Component Value Range Comment   aPTT 35  24 - 37 (seconds)   POCT I-STAT TROPONIN I     Status: Normal   Collection Time   08/27/11  8:26 PM      Component Value Range Comment   Troponin i, poc 0.00  0.00 - 0.08 (ng/mL)    Comment 3            TYPE AND SCREEN     Status: Normal   Collection Time    08/27/11  8:55 PM      Component Value Range Comment   ABO/RH(D) O POS      Antibody Screen NEG      Sample Expiration 08/30/2011     LIPASE, BLOOD     Status: Normal   Collection Time   08/27/11  8:55 PM      Component Value Range Comment   Lipase 12  11 - 59 (U/L)   URINALYSIS, ROUTINE Atkinson REFLEX MICROSCOPIC     Status: Abnormal   Collection Time   08/27/11 10:52 PM      Component Value Range Comment   Color, Urine YELLOW  YELLOW     APPearance CLEAR  CLEAR     Specific Gravity, Urine 1.010  1.005 - 1.030     pH 6.0  5.0 - 8.0     Glucose, UA NEGATIVE  NEGATIVE (mg/dL)    Hgb urine dipstick NEGATIVE  NEGATIVE     Bilirubin Urine NEGATIVE  NEGATIVE     Ketones, ur TRACE (*) NEGATIVE (mg/dL)    Protein, ur NEGATIVE  NEGATIVE (mg/dL)    Urobilinogen, UA 0.2  0.0 - 1.0 (mg/dL)    Nitrite NEGATIVE  NEGATIVE     Leukocytes, UA NEGATIVE  NEGATIVE  MICROSCOPIC NOT DONE ON URINES WITH NEGATIVE PROTEIN, BLOOD, LEUKOCYTES, NITRITE, OR GLUCOSE <1000 mg/dL.  HEMOGLOBIN AND HEMATOCRIT, BLOOD     Status: Abnormal   Collection Time  08/28/11  2:27 AM      Component Value Range Comment   Hemoglobin 10.9 (*) 12.0 - 15.0 (g/dL)    HCT 40.9 (*) 81.1 - 46.0 (%)   PROTIME-INR     Status: Normal   Collection Time   08/28/11  4:56 AM      Component Value Range Comment   Prothrombin Time 14.4  11.6 - 15.2 (seconds)    INR 1.10  0.00 - 1.49    CBC     Status: Abnormal   Collection Time   08/28/11  4:56 AM      Component Value Range Comment   WBC 5.1  4.0 - 10.5 (K/uL)    RBC 3.14 (*) 3.87 - 5.11 (MIL/uL)    Hemoglobin 9.1 (*) 12.0 - 15.0 (g/dL)    HCT 91.4 (*) 78.2 - 46.0 (%)    MCV 85.7  78.0 - 100.0 (fL)    MCH 29.0  26.0 - 34.0 (pg)    MCHC 33.8  30.0 - 36.0 (g/dL)    RDW 95.6  21.3 - 08.6 (%)    Platelets 192  150 - 400 (K/uL)   BASIC METABOLIC PANEL     Status: Abnormal   Collection Time   08/28/11  4:56 AM      Component Value Range Comment   Sodium 135  135 - 145 (mEq/L)    Potassium  4.0  3.5 - 5.1 (mEq/L)    Chloride 104  96 - 112 (mEq/L)    CO2 24  19 - 32 (mEq/L)    Glucose, Bld 123 (*) 70 - 99 (mg/dL)    BUN 15  6 - 23 (mg/dL)    Creatinine, Ser 5.78  0.50 - 1.10 (mg/dL)    Calcium 9.4  8.4 - 10.5 (mg/dL)    GFR calc non Af Amer 74 (*) >90 (mL/min)    GFR calc Af Amer 85 (*) >90 (mL/min)   HEMOGLOBIN AND HEMATOCRIT, BLOOD     Status: Abnormal   Collection Time   08/28/11  4:57 AM      Component Value Range Comment   Hemoglobin 9.4 (*) 12.0 - 15.0 (g/dL)    HCT 46.9 (*) 62.9 - 46.0 (%)     Dg Chest 2 View  08/27/2011  *RADIOLOGY REPORT*  Clinical Data: Syncope  CHEST - 2 VIEW  Comparison: 06/01/2009  Findings: Vascular clips in the right upper abdomen.  Low lung volumes.  Lungs clear.  Heart size normal.  No effusion.  IMPRESSION:  1.  No acute disease  Original Report Authenticated By: Thora Lance III, M.D.    ROS Blood pressure 142/84, pulse 70, temperature 98 F (36.7 C), temperature source Oral, resp. rate 20, height 5\' 3"  (1.6 m), weight 132 lb 14.4 oz (60.283 kg), SpO2 100.00%. Physical ExamAlert and oriented. Skin warm and dry. Oral mucosa is moist.   . Sclera anicteric, conjunctivae is pink. Thyroid not enlarged. No cervical lymphadenopathy. Lungs clear. Heart regular rate and rhythm.  Abdomen is soft. Bowel sounds are positive. No hepatomegaly. No abdominal masses felt. Epigastric tenderness and left lower quadrant.Stool marooned color. Guaiac positive.   No edema to lower extremities. Patient is alert and oriented.   Assessment/Plan:  GI bleed. I will discuss with Dr. Karilyn Cota. Gastric ulcer needs to be ruled out given hx of daily NSAID use.   [name],[name] 08/28/2011, 8:22 AM     GI attendings note; Patient interviewed and examined. She had maroonish/tarry stool on rectal  exam by [name]. She has epigastric tenderness. Given history of NSAID use as well as Aggrenox need to rule out upper GI bleed with rapid  transit. Recommendations; Esophagogastroduodenoscopy to be performed later today and patient is agreeable.

## 2011-08-28 NOTE — ED Provider Notes (Addendum)
Initially, on the patient's arrival and after triage before she was able to be seen, she was registered with the following statement from the triage nurse: "patient at home with family who report patient had syncopal episode tonight. Patient told EMS "I am sick"." No other information on the patient's registration was available and there was no report of gastrointestinal bleeding. Upon being handed the patient's ECG prior to seeing her I noted that she had new inferior and anterior T-wave inversions that were concerning for cardiac ischemia. As the department was very busy, I signed up for the patient to see her and ordered initial labs to get started prior to my evaluation. These labs focused around a workup of syncope and possible myocardial ischemia. My orders included an oral aspirin for possible cardiac ischemia. I was unaware of any GI bleeding component. Dr. Fonnie Jarvis apparently signed up for the patient either at the same time or just after I had signed up for her and ended up seeing the patient instead of me.  Upon realizing that the patient had received the aspirin amidst symptoms of GI bleed (which was unknown to either myself or the patient's RN at the time it was ordered), I informed the patient's nurse and the patient that an error had been made.  I also discussed this with the patient's admitting physician, Dr. Onalee Hua who is aware of the aspirin administration.    Felisa Bonier, MD 08/28/11 [phone]

## 2011-08-28 NOTE — Op Note (Signed)
EGD PROCEDURE REPORT  PATIENT:  [name]  MR#:  [id number] Birthdate:  10/18/1922, 78 y.o., female Endoscopist:  Dr. Malissa Hippo, MD Referred By:  Dr. Colette Ribas, MD Procedure Date: 08/28/2011  Procedure:   EGD  Indications:  Patient 78 year old female who presents with acute GI bleed. She has been on indomethacin for the last 2 weeks she is also in Aggrenox and has epigastric pain and tenderness. It is possible that she is bleeding from upper GI tract with a rapid transit and she is therefore undergoing this examination.            Informed Consent:  Procedure risks were reviewed with the patient and informed consent obtained.  Medications:  Versed 2 mg IV Cetacaine spray topically for oropharyngeal anesthesia  Description of procedure:  The endoscope was introduced through the mouth and advanced to the second portion of the duodenum without difficulty or limitations. The mucosal surfaces were surveyed very carefully during advancement of the scope and upon withdrawal.  Findings:  Esophagus:  Normal esophageal mucosa. GEJ:  38 cm Hiatus:  40 cm Stomach:  Stomach was empty and distended very well with insufflation. Fold in the proximal stomach were normal. Examination mucosa of body was normal. Few antral erosions were noted  without stigmata of bleeding. Pyloric channel was patent. Angularis fundus and cardia was examined by retroflexing the scope and were normal. Duodenum:  Normal bulbar and post bulbar mucosa  Therapeutic/Diagnostic Maneuvers Performed:  None  Complications:  None  Impression: Small sliding hiatal hernia. Erosive antral gastritis. No lesion found to account for patient's GI bleed.  Recommendations:  Colonoscopy in a.m. if patient and family agreeable  Maxwell Martorano U  08/28/2011  2:42 PM  CC: Dr. Colette Ribas, MD, MD & Dr. Bonnetta Barry ref. provider found

## 2011-08-29 ENCOUNTER — Encounter (HOSPITAL_COMMUNITY)
Admission: EM | Disposition: A | Discharge status: Home Health | Payer: Self-pay | Source: Home / Self Care | Attending: Internal Medicine

## 2011-08-29 ENCOUNTER — Encounter (HOSPITAL_COMMUNITY): Payer: Self-pay | Admitting: Internal Medicine

## 2011-08-29 DIAGNOSIS — K2960 Other gastritis without bleeding: Secondary | ICD-10-CM

## 2011-08-29 DIAGNOSIS — E1149 Type 2 diabetes mellitus with other diabetic neurological complication: Secondary | ICD-10-CM | POA: Diagnosis present

## 2011-08-29 DIAGNOSIS — R55 Syncope and collapse: Secondary | ICD-10-CM | POA: Diagnosis present

## 2011-08-29 DIAGNOSIS — K922 Gastrointestinal hemorrhage, unspecified: Secondary | ICD-10-CM | POA: Diagnosis present

## 2011-08-29 DIAGNOSIS — I10 Essential (primary) hypertension: Secondary | ICD-10-CM | POA: Diagnosis present

## 2011-08-29 DIAGNOSIS — K449 Diaphragmatic hernia without obstruction or gangrene: Secondary | ICD-10-CM

## 2011-08-29 HISTORY — DX: Diaphragmatic hernia without obstruction or gangrene: K44.9

## 2011-08-29 HISTORY — PX: COLONOSCOPY: SHX5424

## 2011-08-29 HISTORY — DX: Other gastritis without bleeding: K29.60

## 2011-08-29 LAB — CBC
HCT: 30.8 % — ABNORMAL LOW (ref 36.0–46.0)
HCT: 29.7 % — ABNORMAL LOW (ref 36.0–46.0)
Hemoglobin: 10.1 g/dL — ABNORMAL LOW (ref 12.0–15.0)
Hemoglobin: 9.9 g/dL — ABNORMAL LOW (ref 12.0–15.0)
Hemoglobin: 9 g/dL — ABNORMAL LOW (ref 12.0–15.0)
MCH: 29.2 pg (ref 26.0–34.0)
MCH: 29.4 pg (ref 26.0–34.0)
MCHC: 32.8 g/dL (ref 30.0–36.0)
MCHC: 33.7 g/dL (ref 30.0–36.0)
MCV: 87.6 fL (ref 78.0–100.0)
MCV: 87.3 fL (ref 78.0–100.0)
RBC: 3.39 MIL/uL — ABNORMAL LOW (ref 3.87–5.11)
RBC: 3.06 MIL/uL — ABNORMAL LOW (ref 3.87–5.11)
RDW: 14.4 % (ref 11.5–15.5)
WBC: 4.6 10*3/uL (ref 4.0–10.5)
WBC: 4.8 10*3/uL (ref 4.0–10.5)

## 2011-08-29 LAB — BASIC METABOLIC PANEL
BUN: 11 mg/dL (ref 6–23)
Chloride: 106 mEq/L (ref 96–112)
GFR calc Af Amer: 87 mL/min — ABNORMAL LOW (ref >90)
GFR calc non Af Amer: 75 mL/min — ABNORMAL LOW (ref >90)
Glucose, Bld: 103 mg/dL — ABNORMAL HIGH (ref 70–99)
Potassium: 4.2 mEq/L (ref 3.5–5.1)
Sodium: 138 mEq/L (ref 135–145)

## 2011-08-29 LAB — HEMOGLOBIN AND HEMATOCRIT, BLOOD: HCT: 25.9 % — ABNORMAL LOW (ref 36.0–46.0)

## 2011-08-29 LAB — GLUCOSE, CAPILLARY: Glucose-Capillary: 115 mg/dL — ABNORMAL HIGH (ref 70–99)

## 2011-08-29 SURGERY — COLONOSCOPY
Anesthesia: Moderate Sedation

## 2011-08-29 MED ORDER — MEPERIDINE HCL 50 MG/ML IJ SOLN
INTRAMUSCULAR | Status: DC | PRN
  Administered 2011-08-29: 20 mg via INTRAVENOUS

## 2011-08-29 MED ORDER — MIDAZOLAM HCL 5 MG/5ML IJ SOLN
INTRAMUSCULAR | Status: AC
  Filled 2011-08-29: qty 10

## 2011-08-29 MED ORDER — MEPERIDINE HCL 50 MG/ML IJ SOLN
INTRAMUSCULAR | Status: AC
  Filled 2011-08-29: qty 1

## 2011-08-29 MED ORDER — MIDAZOLAM HCL 5 MG/5ML IJ SOLN
INTRAMUSCULAR | Status: DC | PRN
  Administered 2011-08-29: 2 mg via INTRAVENOUS

## 2011-08-29 NOTE — Progress Notes (Signed)
CARE MANAGEMENT NOTE 08/29/2011  Patient:  [name], [name]   Account Number:  [id number]  Date Initiated:  08/29/2011  Documentation initiated by:  Rosemary Holms  Subjective/Objective Assessment:   Pt admitted with GI bleed. PTA lived alone with family support. No prior HH.     Action/Plan:   Spoke with pt who does not like the idea of HH. Call cousin, [name] and spoke with her about possible HH RN, PT and Aide. She will discuss with pt.   Anticipated DC Date:  09/02/2011   Anticipated DC Plan:  HOME W HOME HEALTH SERVICES      DC Planning Services  CM consult      Choice offered to / List presented to:             Status of service:  In process, will continue to follow Medicare Important Message given?   (If response is "NO", the following Medicare IM given date fields will be blank) Date Medicare IM given:   Date Additional Medicare IM given:    Discharge Disposition:    Per UR Regulation:    Comments:  08/29/11 1400 Kaari Zeigler Leanord Hawking RN BSN CM

## 2011-08-29 NOTE — Progress Notes (Signed)
Subjective: The patient is drinking GoLYTELY. She says that she is still having bloody stools. She has mild cramping lower abdominal pain. No nausea or vomiting.  Objective: Vital signs in last 24 hours: Filed Vitals:   08/28/11 1515 08/28/11 2100 08/29/11 0545 08/29/11 1050  BP: 171/76 138/72 144/68 128/62  Pulse: 79 78 77   Temp: 98 F (36.7 C) 98.4 F (36.9 C) 98.3 F (36.8 C)   TempSrc: Oral     Resp: 16 20 16    Height:      Weight:      SpO2: 96% 98% 99%     Intake/Output Summary (Last 24 hours) at 08/29/11 1052 Last data filed at 08/29/11 0600  Gross per 24 hour  Intake   1100 ml  Output    426 ml  Net    674 ml    Weight change:   Physical exam: Lungs: Clear anteriorly with decreased breath sounds in the bases. Heart: S1, S2, with ectopy and a 2/6 systolic murmur. Abdomen: Positive bowel sounds, mildly obese, mildly tender in the hypogastrium without guarding or rigidity. Extremities: No pedal edema. Neurologic: She is alert and oriented x2. Cranial nerves II through XII are grossly intact.  Lab Results: Basic Metabolic Panel:  Basename 08/29/11 0718 08/28/11 0456  NA 138 135  K 4.2 4.0  CL 106 104  CO2 22 24  GLUCOSE 103* 123*  BUN 11 15  CREATININE 0.73 0.78  CALCIUM 9.7 9.4  MG -- --  PHOS -- --   Liver Function Tests:  Basename 08/27/11 2020  AST 20  ALT 11  ALKPHOS 45  BILITOT 0.3  PROT 6.8  ALBUMIN 3.6    Basename 08/27/11 2055  LIPASE 12  AMYLASE --   No results found for this basename: AMMONIA:2 in the last 72 hours CBC:  Basename 08/29/11 0718 08/29/11 0121 08/28/11 0456 08/27/11 2020  WBC 4.6 -- 5.1 --  NEUTROABS -- -- -- 2.9  HGB 10.1* 8.5* -- --  HCT 30.8* 25.9* -- --  MCV 87.7 -- 85.7 --  PLT 193 -- 192 --   Cardiac Enzymes:  Basename 08/27/11 2020  CKTOTAL 158  CKMB 4.2*  CKMBINDEX --  TROPONINI --   BNP: No results found for this basename: PROBNP:3 in the last 72 hours D-Dimer: No results found for this  basename: DDIMER:2 in the last 72 hours CBG:  Basename 08/29/11 0737 08/28/11 2059 08/28/11 1652  GLUCAP 91 220* 95   Hemoglobin A1C: No results found for this basename: HGBA1C in the last 72 hours Fasting Lipid Panel: No results found for this basename: CHOL,HDL,LDLCALC,TRIG,CHOLHDL,LDLDIRECT in the last 72 hours Thyroid Function Tests: No results found for this basename: TSH,T4TOTAL,FREET4,T3FREE,THYROIDAB in the last 72 hours Anemia Panel: No results found for this basename: VITAMINB12,FOLATE,FERRITIN,TIBC,IRON,RETICCTPCT in the last 72 hours Coagulation:  Basename 08/28/11 0456 08/27/11 2020  LABPROT 14.4 14.9  INR 1.10 1.15   Urine Drug Screen: Drugs of Abuse     Component Value Date/Time   LABOPIA NONE DETECTED 08/11/2007 0332   COCAINSCRNUR NONE DETECTED 08/11/2007 0332   LABBENZ POSITIVE* 08/11/2007 0332   AMPHETMU NONE DETECTED 08/11/2007 0332   THCU NONE DETECTED 08/11/2007 0332   LABBARB  Value: NONE DETECTED        DRUG SCREEN FOR MEDICAL PURPOSES ONLY.  IF CONFIRMATION IS NEEDED FOR ANY PURPOSE, NOTIFY LAB WITHIN 5 DAYS. 08/11/2007 0332    Alcohol Level: No results found for this basename: ETH:2 in the last 72 hours Urinalysis:  Basename 08/27/11 2252  COLORURINE YELLOW  LABSPEC 1.010  PHURINE 6.0  GLUCOSEU NEGATIVE  HGBUR NEGATIVE  BILIRUBINUR NEGATIVE  KETONESUR TRACE*  PROTEINUR NEGATIVE  UROBILINOGEN 0.2  NITRITE NEGATIVE  LEUKOCYTESUR NEGATIVE   Misc. Labs:   Micro: No results found for this or any previous visit (from the past 240 hour(s)).  Studies/Results: Dg Chest 2 View  08/27/2011  *RADIOLOGY REPORT*  Clinical Data: Syncope  CHEST - 2 VIEW  Comparison: 06/01/2009  Findings: Vascular clips in the right upper abdomen.  Low lung volumes.  Lungs clear.  Heart size normal.  No effusion.  IMPRESSION:  1.  No acute disease  Original Report Authenticated By: Osa Craver, M.D.    Medications: I have reviewed the patient's current  medications.  Assessment: Principal Problem:  *GI bleeding Active Problems:  Anemia  Erosive gastritis  Hiatal hernia  Near syncope  Diabetes mellitus  Hypertension   1. GI bleeding. EGD yesterday noted for erosive gastritis and a hiatal hernia. She is on Protonix. Colonoscopy is pending today per Dr. Karilyn Cota.  Anemia, secondary to acute and chronic blood loss.  Her hemoglobin continues to be monitored. No indication for transfusion yet.  Type 2 diabetes mellitus. Currently stable on sliding scale NovoLog. Actos is being held.  Hypertension. Currently stable on enalapril.  Plan:  1. Continue current management and supportive treatment. Continue to monitor her hemoglobin and hematocrit. 2. Colonoscopy scheduled for today by Dr. Karilyn Cota.   LOS: 2 days   [name] 08/29/2011, 10:52 AM

## 2011-08-29 NOTE — Op Note (Signed)
COLONOSCOPY PROCEDURE REPORT  PATIENT:  [name]  MR#:  [id number] Birthdate:  [birth year], 78 y.o., female Endoscopist:  Dr. Malissa Hippo, MD Referred By:  Dr. Colette Ribas, MD Procedure Date: 08/29/2011  Procedure:   Colonoscopy  Indications: Patient is an 78 year-old [place] female with acute GI bleed. She underwent EGD yesterday and no lesion was found to account for bleeding. She is undergoing diagnostic colonoscopy to  Informed Consent:  . Procedure and risks were reviewed with the patient and informed consent was obtained.  Medications:  Demerol 20  mg IV Versed 2 mg IV  Description of procedure:  After a digital rectal exam was performed, that colonoscope was advanced from the anus through the rectum and colon to the area of the cecum, ileocecal valve and appendiceal orifice. The cecum was deeply intubated. These structures were well-seen and photographed for the record. From the level of the cecum and ileocecal valve, the scope was slowly and cautiously withdrawn. The mucosal surfaces were carefully surveyed utilizing scope tip to flexion to facilitate fold flattening as needed. The scope was pulled down into the rectum where a thorough exam including retroflexion was performed.  Findings:   Prep excellent. Delicate electrode the colon but most of these were at sigmoid colon. None with stigmata of bleed. No AV malformations or other abnormalities noted. Hemorrhoids above the dentate line.  Therapeutic/Diagnostic Maneuvers Performed:  None  Complications:  None  Cecal Withdrawal Time:  9 minutes  Impression:  Examination performed to cecum. Pancolonic diverticulosis. Most of the diverticula are located at sigmoid colon. Two had small clot no evidence of active bleed. No therapy rendered.  Recommendations:  Advanced diet. Patient should refrain from using NSAIDs while she is on Aggrenox. CBC in a.m.  Isiaah Cuervo U  08/29/2011 4:05 PM  CC: Dr.  Colette Ribas, MD, MD & Dr. Bonnetta Barry ref. provider found

## 2011-08-29 NOTE — Progress Notes (Signed)
Patient ID: [name], female   DOB: Aug 14, 1922, 78 y.o.   MRN: [id number] She is alert. Attempting to take prep for her colonoscopy.  She has marooned color blood in the bedside commode. She is undergoing a colonoscopy today.  Filed Vitals:   08/28/11 1330 08/28/11 1515 08/28/11 2100 08/29/11 0545  BP: 161/91 171/76 138/72 144/68  Pulse: 75 79 78 77  Temp: 98.2 F (36.8 C) 98 F (36.7 C) 98.4 F (36.9 C) 98.3 F (36.8 C)  TempSrc: Oral Oral    Resp: 18 16 20 16   Height:      Weight:      SpO2: 97% 96% 98% 99%   CBC    Component Value Date/Time   WBC 4.6 08/29/2011 0718   RBC 3.51* 08/29/2011 0718   HGB 10.1* 08/29/2011 0718   HCT 30.8* 08/29/2011 0718   PLT 193 08/29/2011 0718   MCV 87.7 08/29/2011 0718   MCH 28.8 08/29/2011 0718   MCHC 32.8 08/29/2011 0718   RDW 14.4 08/29/2011 0718   LYMPHSABS 1.4 08/27/2011 2020   MONOABS 0.5 08/27/2011 2020   EOSABS 0.1 08/27/2011 2020   BASOSABS 0.0 08/27/2011 2020   Will repeat a CBC this am on her.

## 2011-08-30 ENCOUNTER — Encounter (HOSPITAL_COMMUNITY): Payer: Self-pay | Admitting: Internal Medicine

## 2011-08-30 DIAGNOSIS — I10 Essential (primary) hypertension: Secondary | ICD-10-CM | POA: Diagnosis not present

## 2011-08-30 DIAGNOSIS — G629 Polyneuropathy, unspecified: Secondary | ICD-10-CM

## 2011-08-30 DIAGNOSIS — K5790 Diverticulosis of intestine, part unspecified, without perforation or abscess without bleeding: Secondary | ICD-10-CM

## 2011-08-30 HISTORY — DX: Diverticulosis of intestine, part unspecified, without perforation or abscess without bleeding: K57.90

## 2011-08-30 HISTORY — DX: Polyneuropathy, unspecified: G62.9

## 2011-08-30 LAB — BASIC METABOLIC PANEL
BUN: 5 mg/dL — ABNORMAL LOW (ref 6–23)
Calcium: 9.3 mg/dL (ref 8.4–10.5)
Creatinine, Ser: 0.7 mg/dL (ref 0.50–1.10)
GFR calc non Af Amer: 76 mL/min — ABNORMAL LOW (ref >90)
Glucose, Bld: 87 mg/dL (ref 70–99)
Sodium: 139 mEq/L (ref 135–145)

## 2011-08-30 LAB — URINALYSIS, ROUTINE W REFLEX MICROSCOPIC
Glucose, UA: NEGATIVE mg/dL
Hgb urine dipstick: NEGATIVE
Ketones, ur: NEGATIVE mg/dL
Protein, ur: NEGATIVE mg/dL
pH: 6 (ref 5.0–8.0)

## 2011-08-30 LAB — CBC
MCH: 28.7 pg (ref 26.0–34.0)
MCH: 28.8 pg (ref 26.0–34.0)
MCHC: 32.5 g/dL (ref 30.0–36.0)
MCHC: 32.8 g/dL (ref 30.0–36.0)
MCV: 88 fL (ref 78.0–100.0)
Platelets: 224 10*3/uL (ref 150–400)
Platelets: 209 10*3/uL (ref 150–400)
RBC: 3.35 MIL/uL — ABNORMAL LOW (ref 3.87–5.11)
RDW: 14.4 % (ref 11.5–15.5)
RDW: 14.4 % (ref 11.5–15.5)

## 2011-08-30 LAB — URINE MICROSCOPIC-ADD ON

## 2011-08-30 LAB — GLUCOSE, CAPILLARY
Glucose-Capillary: 88 mg/dL (ref 70–99)
Glucose-Capillary: 113 mg/dL — ABNORMAL HIGH (ref 70–99)
Glucose-Capillary: 290 mg/dL — ABNORMAL HIGH (ref 70–99)
Glucose-Capillary: 240 mg/dL — ABNORMAL HIGH (ref 70–99)

## 2011-08-30 MED ORDER — ENALAPRIL MALEATE 5 MG PO TABS
5.0000 mg | ORAL_TABLET | Freq: Every day | ORAL | Status: DC
  Administered 2011-08-30 – 2011-08-31 (×2): 5 mg via ORAL
  Filled 2011-08-30 (×2): qty 1

## 2011-08-30 NOTE — Progress Notes (Signed)
Pt ambulated in hallway with walker and physical therapist. Pt is now sitting up in chair.

## 2011-08-30 NOTE — Progress Notes (Signed)
Subjective: She denies any bowel movements since the colonoscopy. She complains of urinary incontinence but no outright pain with urination. She is tolerating her breakfast with no increase in abdominal discomfort. She has chronic leg pain which was originally felt to be gout. That's why she was taking the indomethacin. But on further questioning, it appears that she has peripheral neuropathy from both her stroke and diabetes mellitus. She says that the Lyrica does help some but not enough.  Objective: Vital signs in last 24 hours: Filed Vitals:   08/29/11 1600 08/29/11 1605 08/29/11 2143 08/30/11 0520  BP: 148/58 145/58 159/72 161/74  Pulse: 73 76 83 65  Temp:   98.2 F (36.8 C) 98.6 F (37 C)  TempSrc:   Oral Oral  Resp: 12 18 16 16   Height:      Weight:      SpO2: 100% 100% 95% 96%    Intake/Output Summary (Last 24 hours) at 08/30/11 0904 Last data filed at 08/30/11 0500  Gross per 24 hour  Intake    360 ml  Output      2 ml  Net    358 ml    Weight change:   Physical exam: Lungs: Clear anteriorly with decreased breath sounds in the bases. Heart: S1, S2, with ectopy and a 2/6 systolic murmur. Abdomen: Positive bowel sounds, mildly obese, mildly tender in the hypogastrium without guarding or rigidity. Extremities: No pedal edema. Neurologic: She is alert and oriented x2. Cranial nerves II through XII are grossly intact.  Lab Results: Basic Metabolic Panel:  Basename 08/30/11 0734 08/29/11 0718  NA 139 138  K 3.5 4.2  CL 108 106  CO2 24 22  GLUCOSE 87 103*  BUN 5* 11  CREATININE 0.70 0.73  CALCIUM 9.3 9.7  MG -- --  PHOS -- --   Liver Function Tests:  Basename 08/27/11 2020  AST 20  ALT 11  ALKPHOS 45  BILITOT 0.3  PROT 6.8  ALBUMIN 3.6    Basename 08/27/11 2055  LIPASE 12  AMYLASE --   No results found for this basename: AMMONIA:2 in the last 72 hours CBC:  Basename 08/30/11 0808 08/29/11 2153 08/27/11 2020  WBC 5.2 5.1 --  NEUTROABS -- -- 2.9    HGB 9.6* 9.0* --  HCT 29.5* 26.7* --  MCV 88.1 87.3 --  PLT 224 187 --   Cardiac Enzymes:  Basename 08/27/11 2020  CKTOTAL 158  CKMB 4.2*  CKMBINDEX --  TROPONINI --   BNP: No results found for this basename: PROBNP:3 in the last 72 hours D-Dimer: No results found for this basename: DDIMER:2 in the last 72 hours CBG:  Basename 08/30/11 0717 08/29/11 2056 08/29/11 1627 08/29/11 1156 08/29/11 0737 08/28/11 2059  GLUCAP 88 115* 71 71 91 220*   Hemoglobin A1C: No results found for this basename: HGBA1C in the last 72 hours Fasting Lipid Panel: No results found for this basename: CHOL,HDL,LDLCALC,TRIG,CHOLHDL,LDLDIRECT in the last 72 hours Thyroid Function Tests: No results found for this basename: TSH,T4TOTAL,FREET4,T3FREE,THYROIDAB in the last 72 hours Anemia Panel: No results found for this basename: VITAMINB12,FOLATE,FERRITIN,TIBC,IRON,RETICCTPCT in the last 72 hours Coagulation:  Basename 08/28/11 0456 08/27/11 2020  LABPROT 14.4 14.9  INR 1.10 1.15   Urine Drug Screen: Drugs of Abuse     Component Value Date/Time   LABOPIA NONE DETECTED 08/11/2007 0332   COCAINSCRNUR NONE DETECTED 08/11/2007 0332   LABBENZ POSITIVE* 08/11/2007 0332   AMPHETMU NONE DETECTED 08/11/2007 0332   THCU NONE DETECTED  08/11/2007 0332   LABBARB  Value: NONE DETECTED        DRUG SCREEN FOR MEDICAL PURPOSES ONLY.  IF CONFIRMATION IS NEEDED FOR ANY PURPOSE, NOTIFY LAB WITHIN 5 DAYS. 08/11/2007 0332    Alcohol Level: No results found for this basename: ETH:2 in the last 72 hours Urinalysis:  Basename 08/27/11 2252  COLORURINE YELLOW  LABSPEC 1.010  PHURINE 6.0  GLUCOSEU NEGATIVE  HGBUR NEGATIVE  BILIRUBINUR NEGATIVE  KETONESUR TRACE*  PROTEINUR NEGATIVE  UROBILINOGEN 0.2  NITRITE NEGATIVE  LEUKOCYTESUR NEGATIVE   Misc. Labs:   Micro: No results found for this or any previous visit (from the past 240 hour(s)).  Studies/Results: No results found.  Medications: I have reviewed the  patient's current medications.  Assessment: Principal Problem:  *GI bleeding Active Problems:  Anemia  Erosive gastritis  Hiatal hernia  Near syncope  Diabetes mellitus  Diverticulosis  HTN (hypertension), malignant  Peripheral neuropathy   1. GI bleeding. EGD noted for erosive gastritis and a hiatal hernia. She is on Protonix. Colonoscopy her Dr. Karilyn Cota revealed a pancolonic diverticulosis and 2 small clots but no active bleeding.  2. Anemia, secondary to acute blood loss.  Her hemoglobin continues to be monitored. No indication for transfusion yet.   3. Type 2 diabetes mellitus. Currently stable on sliding scale NovoLog. Actos is being held.  4. Diabetic neuropathy and neuropathy secondary to previous strokes. She is on amitriptyline and Lyrica. As needed hydrocodone has been added.   5. Hypertension, now in the malignant range. This may be attributable to IV fluids.  6. History of strokes. On Aggrenox chronically. Currently being held. Will asked GI when Aggrenox can be restarted.   Plan:  1. We'll ask GI when Aggrenox can be restarted. 2. We'll increase the dose of enalapril to 5 mg daily. 3. We'll decrease the rate of the IV fluids. 4.PT consultation. We'll asked the nursing staff to get the patient out of bed to recliner today.    LOS: 3 days   Maanya Hippert 08/30/2011, 9:04 AM

## 2011-08-30 NOTE — Evaluation (Signed)
Physical Therapy Evaluation Patient Details Name: [name] MRN: [id number] DOB: 26-Oct-1922 Today's Date: 08/30/2011  Problem List:  Patient Active Problem List  Diagnoses  . Anemia  . Erosive gastritis  . Hiatal hernia  . Near syncope  . GI bleeding  . Diabetes mellitus  . Diverticulosis  . HTN (hypertension), malignant  . Peripheral neuropathy    Past Medical History:  Past Medical History  Diagnosis Date  . Stroke 2000  . Stroke 40981  . Diabetes mellitus   . Hypertension   . Erosive gastritis 08/29/2011    Per EGD  . Hiatal hernia 08/29/2011    Per EGD  . Diverticulosis 08/30/2011    Per colonoscopy  . Peripheral neuropathy 08/30/2011   Past Surgical History:  Past Surgical History  Procedure Date  . Abdominal hysterectomy   . Tonsillectomy   . Cholecystectomy   . Appendectomy     PT Assessment/Plan/Recommendation PT Assessment Clinical Impression Statement: pt very pleasant and cooperative...we have found pt to have decreased LE strength with overall deconditioning...she is far too deconditioned to be living by herself, and I would recommend SNF at d/c to try to build her up physically...we will follow acutely to begin that process                PT Recommendation/Assessment: Patient will need skilled PT in the acute care venue PT Problem List: Decreased strength;Decreased activity tolerance;Decreased mobility;Impaired sensation (LE peripheral neuropathy) Barriers to Discharge: Decreased caregiver support PT Therapy Diagnosis : Difficulty walking;Generalized weakness PT Plan PT Frequency: Min 3X/week PT Treatment/Interventions: Gait training;Functional mobility training;Therapeutic activities;Therapeutic exercise;Patient/family education PT Recommendation Follow Up Recommendations: Skilled nursing facility Equipment Recommended: Defer to next venue PT Goals  Acute Rehab PT Goals PT Goal Formulation: With patient Time For Goal Achievement: 2 weeks Pt  will go Sit to Stand: with modified independence PT Goal: Sit to Stand - Progress: Goal set today Pt will go Stand to Sit: with modified independence PT Goal: Stand to Sit - Progress: Goal set today Pt will Ambulate: 51 - 150 feet;with modified independence PT Goal: Ambulate - Progress: Goal set today  PT Evaluation Precautions/Restrictions  Precautions Precautions: Fall Required Braces or Orthoses: No Restrictions Weight Bearing Restrictions: No Prior Functioning  Home Living Lives With: Alone Receives Help From: Family Type of Home: House Home Layout: One level Home Access: Stairs to enter Entrance Stairs-Rails: Right Entrance Stairs-Number of Steps: 4 Prior Function Level of Independence: Independent with homemaking with ambulation;Independent with gait;Independent with transfers;Requires assistive device for independence Vocation: Retired Financial risk analyst Arousal/Alertness: Awake/alert Overall Cognitive Status: Appears within functional limits for tasks assessed Orientation Level: Oriented X4 Sensation/Coordination Sensation Light Touch: Not tested Stereognosis: Not tested Hot/Cold: Not tested Proprioception: Appears Intact Extremity Assessment RUE Assessment RUE Assessment: Within Functional Limits LUE Assessment LUE Assessment: Within Functional Limits RLE Assessment RLE Assessment: Exceptions to [facility] RLE Strength RLE Overall Strength Comments: 2/5 on MMT but functioanlly, strength is 3/5 LLE Assessment LLE Assessment: Exceptions to [facility] LLE Strength LLE Overall Strength Comments: 2+/5 on MMT but functionally strength is 3+/5 Mobility (including Balance) Bed Mobility Bed Mobility: Yes Rolling Right: 7: Independent Rolling Left: 7: Independent Supine to Sit: 5: Supervision Transfers Transfers: Yes Sit to Stand: 5: Supervision Stand to Sit: 5: Supervision Ambulation/Gait Ambulation/Gait: Yes Ambulation/Gait Assistance: 5: Supervision Ambulation  Distance (Feet): 80 Feet Assistive device: Rolling walker Gait Pattern: Within Functional Limits Stairs: No Wheelchair Mobility Wheelchair Mobility: No  Posture/Postural Control Posture/Postural Control: No significant limitations Balance Balance  Assessed: Yes Static Sitting Balance Static Sitting - Level of Assistance: 7: Independent Dynamic Sitting Balance Dynamic Sitting - Level of Assistance: 7: Independent Static Standing Balance Static Standing - Level of Assistance: 5: Stand by assistance Exercise  General Exercises - Lower Extremity Ankle Circles/Pumps: AROM;Strengthening;Both;10 reps Heel Slides: AAROM;Both;10 reps;Supine Hip ABduction/ADduction: AAROM;Both;10 reps;Supine End of Session PT - End of Session Equipment Utilized During Treatment: Gait belt Activity Tolerance: Patient tolerated treatment well Patient left: in chair;with call bell in reach Nurse Communication: Mobility status for transfers;Mobility status for ambulation General Behavior During Session: [facility] for tasks performed Cognition: [facility] for tasks performed  Konrad Penta 08/30/2011, 11:23 AM

## 2011-08-31 ENCOUNTER — Encounter (HOSPITAL_COMMUNITY): Payer: Self-pay | Admitting: Internal Medicine

## 2011-08-31 DIAGNOSIS — R5381 Other malaise: Secondary | ICD-10-CM | POA: Diagnosis not present

## 2011-08-31 DIAGNOSIS — E876 Hypokalemia: Secondary | ICD-10-CM | POA: Diagnosis not present

## 2011-08-31 DIAGNOSIS — N390 Urinary tract infection, site not specified: Secondary | ICD-10-CM | POA: Diagnosis not present

## 2011-08-31 LAB — GLUCOSE, CAPILLARY
Glucose-Capillary: 104 mg/dL — ABNORMAL HIGH (ref 70–99)
Glucose-Capillary: 100 mg/dL — ABNORMAL HIGH (ref 70–99)

## 2011-08-31 LAB — BASIC METABOLIC PANEL
CO2: 25 mEq/L (ref 19–32)
Chloride: 106 mEq/L (ref 96–112)
Creatinine, Ser: 0.68 mg/dL (ref 0.50–1.10)
GFR calc Af Amer: 89 mL/min — ABNORMAL LOW (ref >90)
Potassium: 3.4 mEq/L — ABNORMAL LOW (ref 3.5–5.1)

## 2011-08-31 LAB — CBC
MCV: 88.7 fL (ref 78.0–100.0)
Platelets: 209 10*3/uL (ref 150–400)
RBC: 3.27 MIL/uL — ABNORMAL LOW (ref 3.87–5.11)
RDW: 14.5 % (ref 11.5–15.5)
WBC: 4.4 10*3/uL (ref 4.0–10.5)

## 2011-08-31 MED ORDER — CIPROFLOXACIN HCL 250 MG PO TABS
250.0000 mg | ORAL_TABLET | Freq: Two times a day (BID) | ORAL | Status: DC
  Administered 2011-08-31: 250 mg via ORAL
  Filled 2011-08-31: qty 1

## 2011-08-31 MED ORDER — HYDROCODONE-ACETAMINOPHEN 5-325 MG PO TABS: 1.0000 | ORAL_TABLET | Freq: Four times a day (QID) | ORAL | Status: AC | PRN

## 2011-08-31 MED ORDER — PANTOPRAZOLE SODIUM 40 MG PO TBEC: 40.0000 mg | DELAYED_RELEASE_TABLET | Freq: Every day | ORAL | Status: DC

## 2011-08-31 MED ORDER — ASPIRIN-DIPYRIDAMOLE ER 25-200 MG PO CP12: 1.0000 | ORAL_CAPSULE | Freq: Two times a day (BID) | ORAL | Status: DC

## 2011-08-31 MED ORDER — THERA VITAL M PO TABS: 1.0000 | ORAL_TABLET | Freq: Every day | ORAL | Status: AC

## 2011-08-31 MED ORDER — POTASSIUM CHLORIDE CRYS ER 20 MEQ PO TBCR
20.0000 meq | EXTENDED_RELEASE_TABLET | Freq: Every day | ORAL | Status: DC
Start: 2011-08-31 — End: 2011-08-31
  Administered 2011-08-31: 20 meq via ORAL

## 2011-08-31 MED ORDER — POTASSIUM CHLORIDE ER 10 MEQ PO TBCR: 20.0000 meq | EXTENDED_RELEASE_TABLET | Freq: Every day | ORAL | Status: DC

## 2011-08-31 MED ORDER — CIPROFLOXACIN HCL 250 MG PO TABS: 250.0000 mg | ORAL_TABLET | Freq: Two times a day (BID) | ORAL | Status: AC

## 2011-08-31 NOTE — Progress Notes (Signed)
CARE MANAGEMENT NOTE 08/31/2011  Patient:  [name], [name]   Account Number:  [id number]  Date Initiated:  08/29/2011  Documentation initiated by:  Rosemary Holms  Subjective/Objective Assessment:   Pt admitted with GI bleed. PTA lived alone with family support. No prior HH.     Action/Plan:   Spoke with pt who does not like the idea of HH. Call cousin, [name] and spoke with her about possible HH RN, PT and Aide. She will discuss with pt.   Anticipated DC Date:  09/02/2011   Anticipated DC Plan:  HOME W HOME HEALTH SERVICES      DC Planning Services  CM consult      Choice offered to / List presented to:          [facility] arranged  HH-1 RN  HH-10 DISEASE MANAGEMENT  HH-2 PT      HH agency  [facility]   Status of service:  In process, will continue to follow Medicare Important Message given?   (If response is "NO", the following Medicare IM given date fields will be blank) Date Medicare IM given:   Date Additional Medicare IM given:    Discharge Disposition:    Per UR Regulation:    Comments:  08/31/11 1400 Joeanne Robicheaux RN BSN CM Pt to be discharged to sister, [name] home at [address] [place], [place]. this contact telephone # is [phone].  08/29/11 1400 Shaundra Fullam Leanord Hawking RN BSN CM

## 2011-08-31 NOTE — Progress Notes (Signed)
IV removed, site WNL.  Pt given d/c instructions and new prescriptions.  Discussed home care with patient and discussed home medications, patient verbalizes understanding. F/U appointments in place, pt states they will keep appointments. Pt is stable at this time. Home Health arranged by case management, equipement to be delivered to pt's sisters home.  Instruction on avoiding NSAIDS discussed and information sheet given to pt.

## 2011-08-31 NOTE — Progress Notes (Signed)
IV removed, site WNL.  Pt given d/c instructions and new prescriptions.  Discussed home care with patient and discussed home medications, patient verbalizes understanding. F/U appointment in place with Dr. Phillips Odor, pt states they will keep appointments. Told pt and her niece she could f/u with Dr. Karilyn Cota prn. Pt is stable at this time.  Teach back occurred. Thoroughly went over medications with pt and her niece. Pt taken to main entrance in wheelchair by staff member.

## 2011-08-31 NOTE — Progress Notes (Signed)
Subjective: She has no new complaints. She denies black tarry stools or bright red blood per rectum overnight. She continues to have occasional abdominal cramps which he says feels like menstrual cramps.  Objective: Vital signs in last 24 hours: Filed Vitals:   08/30/11 0520 08/30/11 1500 08/30/11 2151 08/31/11 0609  BP: 161/74 160/71 136/48 129/68  Pulse: 65 79 83 74  Temp: 98.6 F (37 C) 97.6 F (36.4 C) 98.4 F (36.9 C) 98.4 F (36.9 C)  TempSrc: Oral  Oral Oral  Resp: 16 16 16 16   Height:      Weight:      SpO2: 96% 94% 96% 98%    Intake/Output Summary (Last 24 hours) at 08/31/11 4540 Last data filed at 08/30/11 2100  Gross per 24 hour  Intake   3322 ml  Output    875 ml  Net   2447 ml    Weight change:   Physical exam: Lungs: Clear anteriorly with decreased breath sounds in the bases. Heart: S1, S2, with ectopy and a 2/6 systolic murmur. Abdomen: Positive bowel sounds, mildly obese, mildly tender over the hypogastrium without guarding or rigidity. Extremities: No pedal edema. Global tenderness over the lower extremities. No acute hot red joints. Neurologic: She is alert and oriented x2. Cranial nerves II through XII are grossly intact. Overall strength is 5 minus to 4+ over 5 due to deconditioning.  Lab Results: Basic Metabolic Panel:  Basename 08/30/11 0734 08/29/11 0718  NA 139 138  K 3.5 4.2  CL 108 106  CO2 24 22  GLUCOSE 87 103*  BUN 5* 11  CREATININE 0.70 0.73  CALCIUM 9.3 9.7  MG -- --  PHOS -- --   Liver Function Tests: No results found for this basename: AST:2,ALT:2,ALKPHOS:2,BILITOT:2,PROT:2,ALBUMIN:2 in the last 72 hours No results found for this basename: LIPASE:2,AMYLASE:2 in the last 72 hours No results found for this basename: AMMONIA:2 in the last 72 hours CBC:  Basename 08/31/11 0824 08/30/11 1800  WBC 4.4 5.2  NEUTROABS -- --  HGB 9.4* 9.6*  HCT 29.0* 29.3*  MCV 88.7 88.0  PLT 209 209   Cardiac Enzymes: No results found for  this basename: CKTOTAL:3,CKMB:3,CKMBINDEX:3,TROPONINI:3 in the last 72 hours BNP: No results found for this basename: PROBNP:3 in the last 72 hours D-Dimer: No results found for this basename: DDIMER:2 in the last 72 hours CBG:  Basename 08/31/11 0709 08/30/11 2046 08/30/11 1648 08/30/11 1105 08/30/11 0717 08/29/11 2056  GLUCAP 104* 240* 290* 113* 88 115*   Hemoglobin A1C: No results found for this basename: HGBA1C in the last 72 hours Fasting Lipid Panel: No results found for this basename: CHOL,HDL,LDLCALC,TRIG,CHOLHDL,LDLDIRECT in the last 72 hours Thyroid Function Tests: No results found for this basename: TSH,T4TOTAL,FREET4,T3FREE,THYROIDAB in the last 72 hours Anemia Panel: No results found for this basename: VITAMINB12,FOLATE,FERRITIN,TIBC,IRON,RETICCTPCT in the last 72 hours Coagulation: No results found for this basename: LABPROT:2,INR:2 in the last 72 hours Urine Drug Screen: Drugs of Abuse     Component Value Date/Time   LABOPIA NONE DETECTED 08/11/2007 0332   COCAINSCRNUR NONE DETECTED 08/11/2007 0332   LABBENZ POSITIVE* 08/11/2007 0332   AMPHETMU NONE DETECTED 08/11/2007 0332   THCU NONE DETECTED 08/11/2007 0332   LABBARB  Value: NONE DETECTED        DRUG SCREEN FOR MEDICAL PURPOSES ONLY.  IF CONFIRMATION IS NEEDED FOR ANY PURPOSE, NOTIFY LAB WITHIN 5 DAYS. 08/11/2007 0332    Alcohol Level: No results found for this basename: ETH:2 in the last 72  hours Urinalysis:  Basename 08/30/11 1628  COLORURINE YELLOW  LABSPEC 1.010  PHURINE 6.0  GLUCOSEU NEGATIVE  HGBUR NEGATIVE  BILIRUBINUR NEGATIVE  KETONESUR NEGATIVE  PROTEINUR NEGATIVE  UROBILINOGEN 0.2  NITRITE NEGATIVE  LEUKOCYTESUR SMALL*   Misc. Labs:   Micro: No results found for this or any previous visit (from the past 240 hour(s)).  Studies/Results: No results found.  Medications: I have reviewed the patient's current medications.  Assessment: Principal Problem:  *GI bleeding Active Problems:  Anemia   Erosive gastritis  Hiatal hernia  Near syncope  Diabetes mellitus  Diverticulosis  HTN (hypertension), malignant  Peripheral neuropathy  Physical deconditioning  UTI (lower urinary tract infection)   1. GI bleeding. EGD noted for erosive gastritis and a hiatal hernia. She is on Protonix. Colonoscopy her Dr. Karilyn Cota revealed a pancolonic diverticulosis and 2 small clots but no active bleeding.  2. Anemia, secondary to acute blood loss.  Her hemoglobin continues to be monitored. No indication for transfusion yet.   3. Type 2 diabetes mellitus. Currently stable on sliding scale NovoLog. Actos is being held, but can be restarted upon discharge when insulin is discontinued.  4. Diabetic neuropathy and neuropathy secondary to previous strokes. She is on amitriptyline and Lyrica. As needed hydrocodone has been added.   5. Hypertension. Overall trend has been up. This may be attributable to IV fluids. The dose of enalapril was increased to 5 mg daily.  6. History of strokes. On Aggrenox chronically. Currently being held.    7. Probable UTI. Given her lower abdominal cramping, will start Rocephin empirically.   8. Physical deconditioning. The recommendation by the physical therapist is noted. The patient is apprehensive about going to short-term skilled nursing facility for rehabilitation. She requests that we talked to her niece about it. If her niece agrees, she is willing to go.  Plan:  1. We'll ask Dr. Karilyn Cota to address the restart of Aggrenox. 2. We'll try to contact the patient's niece to discuss recommendations for short-term skilled nursing facility placement. 3. Start Rocephin. 4. Would discharge to home today if okay with GI and if the patient does not agree to skilled nursing facility placement. Will try to arrange home health PT registered nurse then.     LOS: 4 days   Alexus Galka 08/31/2011, 9:21 AM

## 2011-08-31 NOTE — Discharge Summary (Signed)
Physician Discharge Summary  [name] MRN: [id number] DOB/AGE: 1922-11-02 78 y.o.  PCP: Colette Ribas, MD, MD   Admit date: 08/27/2011 Discharge date: 08/31/2011  Discharge Diagnoses:  1. GI bleeding, findings suggestive of diverticular bleed. Exacerbated by NSAIDs. 2. Colonoscopy by Dr. Karilyn Cota on 08/29/2011 revealed pancolonic diverticulosis, mostly located at the sigmoid colon. 2 small clots with no evidence of active bleeding. 3. EGD performed by Dr. Karilyn Cota on 08/28/2011 revealed small sliding hiatal hernia and erosive antral gastritis. H. pylori serology was negative. 4. Blood loss anemia. 5. Hypertension. 6. Peripheral neuropathy and chronic bilateral lower extremity pain. 7. Urinary tract infection. 8. Hypokalemia. 9. Physical deconditioning. 10. Near-syncope on admission, resolved. 11. History of previous strokes. 12. Inverted lateral T waves.   Medication List  As of 08/31/2011  4:06 PM   STOP taking these medications         indomethacin 50 MG capsule      omeprazole 20 MG tablet         TAKE these medications         amitriptyline 25 MG tablet   Commonly known as: ELAVIL   Take 25 mg by mouth at bedtime. FOR SLEEP      ciprofloxacin 250 MG tablet   Commonly known as: CIPRO   Take 1 tablet (250 mg total) by mouth 2 (two) times daily. ANTIBIOTIC TO TAKE FOR 5 MORE DAYS.      clidinium-chlordiazePOXIDE 2.5-5 MG per capsule   Commonly known as: LIBRAX   Take 1 capsule by mouth at bedtime as needed. FOR GAS      dipyridamole-aspirin 25-200 MG per 12 hr capsule   Commonly known as: AGGRENOX   Take 1 capsule by mouth 2 (two) times daily. RESTART ON Monday September 03, 2011.   FOR STROKE PREVENTION      enalapril 2.5 MG tablet   Commonly known as: VASOTEC   Take 2.5 mg by mouth daily. FOR BLOOD PRESSURE AND KIDNEY PROTECTION      HYDROcodone-acetaminophen 5-325 MG per tablet   Commonly known as: NORCO   Take 1 tablet by mouth every 6 (six) hours  as needed.      multivitamin tablet   Take 1 tablet by mouth daily.      pantoprazole 40 MG tablet   Commonly known as: PROTONIX   Take 1 tablet (40 mg total) by mouth daily at 12 noon. FOR TREATMENT OF IRRITATION OF YOUR STOMACH LINING.      pioglitazone 30 MG tablet   Commonly known as: ACTOS   Take 30 mg by mouth daily. FOR DIABETES      potassium chloride 10 MEQ tablet   Commonly known as: K-DUR   Take 2 tablets (20 mEq total) by mouth daily.      pregabalin 50 MG capsule   Commonly known as: LYRICA   Take 50 mg by mouth 3 (three) times daily.            Discharge Condition: Improved and stable.  Disposition: Home.    Consults: Lionel December M.D.   Significant Diagnostic Studies: Dg Chest 2 View  08/27/2011  *RADIOLOGY REPORT*  Clinical Data: Syncope  CHEST - 2 VIEW  Comparison: 06/01/2009  Findings: Vascular clips in the right upper abdomen.  Low lung volumes.  Lungs clear.  Heart size normal.  No effusion.  IMPRESSION:  1.  No acute disease  Original Report Authenticated By: Osa Craver, M.D.    Microbiology: No  results found for this or any previous visit (from the past 240 hour(s)).   Labs: Results for orders placed during the hospital encounter of 08/27/11 (from the past 48 hour(s))  GLUCOSE, CAPILLARY     Status: Normal   Collection Time   08/29/11  4:27 PM      Component Value Range Comment   Glucose-Capillary 71  70 - 99 (mg/dL)   CBC     Status: Abnormal   Collection Time   08/29/11  4:33 PM      Component Value Range Comment   WBC 4.8  4.0 - 10.5 (K/uL)    RBC 3.39 (*) 3.87 - 5.11 (MIL/uL)    Hemoglobin 9.9 (*) 12.0 - 15.0 (g/dL)    HCT 45.4 (*) 09.8 - 46.0 (%)    MCV 87.6  78.0 - 100.0 (fL)    MCH 29.2  26.0 - 34.0 (pg)    MCHC 33.3  30.0 - 36.0 (g/dL)    RDW 11.9  14.7 - 82.9 (%)    Platelets 194  150 - 400 (K/uL)   GLUCOSE, CAPILLARY     Status: Abnormal   Collection Time   08/29/11  8:56 PM      Component Value Range Comment    Glucose-Capillary 115 (*) 70 - 99 (mg/dL)    Comment 1 Notify RN     CBC     Status: Abnormal   Collection Time   08/29/11  9:53 PM      Component Value Range Comment   WBC 5.1  4.0 - 10.5 (K/uL)    RBC 3.06 (*) 3.87 - 5.11 (MIL/uL)    Hemoglobin 9.0 (*) 12.0 - 15.0 (g/dL)    HCT 56.2 (*) 13.0 - 46.0 (%)    MCV 87.3  78.0 - 100.0 (fL)    MCH 29.4  26.0 - 34.0 (pg)    MCHC 33.7  30.0 - 36.0 (g/dL)    RDW 86.5  78.4 - 69.6 (%)    Platelets 187  150 - 400 (K/uL)   GLUCOSE, CAPILLARY     Status: Normal   Collection Time   08/30/11  7:17 AM      Component Value Range Comment   Glucose-Capillary 88  70 - 99 (mg/dL)    Comment 1 Notify RN      Comment 2 Documented in Chart     BASIC METABOLIC PANEL     Status: Abnormal   Collection Time   08/30/11  7:34 AM      Component Value Range Comment   Sodium 139  135 - 145 (mEq/L)    Potassium 3.5  3.5 - 5.1 (mEq/L)    Chloride 108  96 - 112 (mEq/L)    CO2 24  19 - 32 (mEq/L)    Glucose, Bld 87  70 - 99 (mg/dL)    BUN 5 (*) 6 - 23 (mg/dL)    Creatinine, Ser 2.95  0.50 - 1.10 (mg/dL)    Calcium 9.3  8.4 - 10.5 (mg/dL)    GFR calc non Af Amer 76 (*) >90 (mL/min)    GFR calc Af Amer 88 (*) >90 (mL/min)   CBC     Status: Abnormal   Collection Time   08/30/11  8:08 AM      Component Value Range Comment   WBC 5.2  4.0 - 10.5 (K/uL)    RBC 3.35 (*) 3.87 - 5.11 (MIL/uL)    Hemoglobin 9.6 (*) 12.0 - 15.0 (g/dL)  HCT 29.5 (*) 36.0 - 46.0 (%)    MCV 88.1  78.0 - 100.0 (fL)    MCH 28.7  26.0 - 34.0 (pg)    MCHC 32.5  30.0 - 36.0 (g/dL)    RDW 16.1  09.6 - 04.5 (%)    Platelets 224  150 - 400 (K/uL)   GLUCOSE, CAPILLARY     Status: Abnormal   Collection Time   08/30/11 11:05 AM      Component Value Range Comment   Glucose-Capillary 113 (*) 70 - 99 (mg/dL)    Comment 1 Notify RN      Comment 2 Documented in Chart     URINALYSIS, ROUTINE W REFLEX MICROSCOPIC     Status: Abnormal   Collection Time   08/30/11  4:28 PM      Component Value Range  Comment   Color, Urine YELLOW  YELLOW     APPearance CLEAR  CLEAR     Specific Gravity, Urine 1.010  1.005 - 1.030     pH 6.0  5.0 - 8.0     Glucose, UA NEGATIVE  NEGATIVE (mg/dL)    Hgb urine dipstick NEGATIVE  NEGATIVE     Bilirubin Urine NEGATIVE  NEGATIVE     Ketones, ur NEGATIVE  NEGATIVE (mg/dL)    Protein, ur NEGATIVE  NEGATIVE (mg/dL)    Urobilinogen, UA 0.2  0.0 - 1.0 (mg/dL)    Nitrite NEGATIVE  NEGATIVE     Leukocytes, UA SMALL (*) NEGATIVE    URINE MICROSCOPIC-ADD ON     Status: Abnormal   Collection Time   08/30/11  4:28 PM      Component Value Range Comment   Squamous Epithelial / LPF RARE  RARE     WBC, UA 11-20  <3 (WBC/hpf)    Bacteria, UA FEW (*) RARE    GLUCOSE, CAPILLARY     Status: Abnormal   Collection Time   08/30/11  4:48 PM      Component Value Range Comment   Glucose-Capillary 290 (*) 70 - 99 (mg/dL)    Comment 1 Notify RN      Comment 2 Documented in Chart     CBC     Status: Abnormal   Collection Time   08/30/11  6:00 PM      Component Value Range Comment   WBC 5.2  4.0 - 10.5 (K/uL)    RBC 3.33 (*) 3.87 - 5.11 (MIL/uL)    Hemoglobin 9.6 (*) 12.0 - 15.0 (g/dL)    HCT 40.9 (*) 81.1 - 46.0 (%)    MCV 88.0  78.0 - 100.0 (fL)    MCH 28.8  26.0 - 34.0 (pg)    MCHC 32.8  30.0 - 36.0 (g/dL)    RDW 91.4  78.2 - 95.6 (%)    Platelets 209  150 - 400 (K/uL)   GLUCOSE, CAPILLARY     Status: Abnormal   Collection Time   08/30/11  8:46 PM      Component Value Range Comment   Glucose-Capillary 240 (*) 70 - 99 (mg/dL)    Comment 1 Documented in Chart      Comment 2 Notify RN     GLUCOSE, CAPILLARY     Status: Abnormal   Collection Time   08/31/11  7:09 AM      Component Value Range Comment   Glucose-Capillary 104 (*) 70 - 99 (mg/dL)    Comment 1 Documented in Chart      Comment 2  Notify RN     BASIC METABOLIC PANEL     Status: Abnormal   Collection Time   08/31/11  8:24 AM      Component Value Range Comment   Sodium 140  135 - 145 (mEq/L)    Potassium  3.4 (*) 3.5 - 5.1 (mEq/L)    Chloride 106  96 - 112 (mEq/L)    CO2 25  19 - 32 (mEq/L)    Glucose, Bld 122 (*) 70 - 99 (mg/dL)    BUN 6  6 - 23 (mg/dL)    Creatinine, Ser 2.72  0.50 - 1.10 (mg/dL)    Calcium 9.4  8.4 - 10.5 (mg/dL)    GFR calc non Af Amer 77 (*) >90 (mL/min)    GFR calc Af Amer 89 (*) >90 (mL/min)   CBC     Status: Abnormal   Collection Time   08/31/11  8:24 AM      Component Value Range Comment   WBC 4.4  4.0 - 10.5 (K/uL)    RBC 3.27 (*) 3.87 - 5.11 (MIL/uL)    Hemoglobin 9.4 (*) 12.0 - 15.0 (g/dL)    HCT 53.6 (*) 64.4 - 46.0 (%)    MCV 88.7  78.0 - 100.0 (fL)    MCH 28.7  26.0 - 34.0 (pg)    MCHC 32.4  30.0 - 36.0 (g/dL)    RDW 03.4  74.2 - 59.5 (%)    Platelets 209  150 - 400 (K/uL)   GLUCOSE, CAPILLARY     Status: Abnormal   Collection Time   08/31/11 11:08 AM      Component Value Range Comment   Glucose-Capillary 100 (*) 70 - 99 (mg/dL)    Comment 1 Documented in Chart      Comment 2 Notify RN        HPI : The patient is an 18 seizure 3 with a past medical history significant for previous strokes and hypertension, who presented to the emergency department on 08/27/2011 with a chief complaint of bright red blood per rectum and almost passing out. In the emergency department, she was hemodynamically stable and afebrile. Her lab data were significant for a serum sodium of 133, glucose of 162, normal LFTs, normal lipase, hemoglobin of 10.2 (it had been 12 a year ago), normal cardiac enzymes, and a chest x-ray that revealed no acute disease. Her EKG revealed lateral T-wave inversions. She was admitted for further evaluation and management.  HOSPITAL COURSE: The patient was started on IV fluid hydration. Her hemoglobin and hematocrit were monitored every 6 hours and then as needed. IV Protonix was started empirically. Indomethacin and Aggrenox were discontinued. Gastroenterologist Dr. Karilyn Cota was consulted. Following his assessment, he proceeded with an upper endoscopy  to rule out a gastric ulcer. The results were significant for erosive antral gastritis. Dr. Karilyn Cota been proceeded with a colonoscopy at a later. It revealed pancolonic diverticulosis and 2 small clots with no active bleeding. No therapy was rendered.  Apparently, the patient had been started on indomethacin prior to this hospitalization for treatment of chronic bilateral leg pain. Both she and her family were instructed to discontinue indomethacin and all other NSAIDs except Aggrenox. She was also instructed to restart Aggrenox in 3 days following discharge.  It appears that the patient has chronic bilateral lower extremity neuropathy for which she takes Lyrica and amitriptyline. She was described as needed hydrocodone as well for pain. She was noted to have some deconditioning during hospitalization. The physical  therapist was consulted. She recommended short-term skilled nursing facility placement for ongoing strengthening and rehabilitation. However, the patient refused. The family informed the medical team that she would be staying with her niece and/or her sister. Therefore, home health physical therapy was ordered and arranged.  Following hydration, the patient's hemoglobin fell to a nadir of 8.5. It stabilized at 9.0-9.9 over the last couple days of the hospitalization with no transfusion.  The patient's urinalysis was indicative of a urinary tract infection. She did complain of mild crampy lower abdominal pain. For this reason, Cipro was started empirically and continued following discharge.  The patient was noted to have inverted T waves on admission but her cardiac enzymes were normal. A followup EKG was not ordered nor were further cardiac enzymes The patient had no complaints of chest pain. Her blood pressure did increase necessitating an increase in lisinopril. However, once her pain subsided and she improved clinically, her blood pressure normalized. Therefore, she was discharged on her usual  home dose of lisinopril. Further management will be deferred to her primary care physician Dr. Regino Schultze or Dr. Phillips Odor. The patient would benefit from an outpatient cardiology evaluation.    Discharge Exam: Blood pressure 129/68, pulse 74, temperature 98.4 F (36.9 C), temperature source Oral, resp. rate 16, height 5\' 3"  (1.6 m), weight 60.283 kg (132 lb 14.4 oz), SpO2 98.00%.  Lungs: Clear to auscultation bilaterally. Heart: S1, S2, with a soft systolic murmur. Abdomen: Positive bowel sounds, soft, nontender, nondistended. Extremities: No pedal edema.   Discharge Orders    Future Orders Please Complete By Expires   Diet - low sodium heart healthy      Increase activity slowly      Discharge instructions      Comments:   RESTART AGGRENOX ON Monday [facility].  DO NOT TAKE ANY NSAIDS OR ASPIRIN-LIKE MEDICINES EXCEPT AGGRENOX.      Follow-up Information    Follow up with Colette Ribas, MD in 2 weeks.      Call Malissa Hippo, MD. (As needed)    Contact information:   [address], [address] [place] [address] [phone]           Total discharge time: 40 minutes.   Signed: Tine Mabee 08/31/2011, 4:06 PM

## 2011-08-31 NOTE — Progress Notes (Signed)
Referred to this CSW today for ?SNF. Chart reviewed and have spoken with RNCM and Care Coordinator who indicate patient plans to d/c home with HH and DME. CSW to sign off- please contact us if SW needs arise. Karesa Maultsby, MSW, LCSWA [phone]   

## 2011-09-01 LAB — URINE CULTURE
Colony Count: >=100000
Culture  Setup Time: 201303010142

## 2011-09-04 ENCOUNTER — Encounter (HOSPITAL_COMMUNITY): Payer: Self-pay | Admitting: Internal Medicine

## 2011-10-10 NOTE — Progress Notes (Signed)
Retro UR review completed  

## 2011-12-11 ENCOUNTER — Other Ambulatory Visit (HOSPITAL_COMMUNITY): Payer: Self-pay | Admitting: Internal Medicine

## 2011-12-11 DIAGNOSIS — Z139 Encounter for screening, unspecified: Secondary | ICD-10-CM

## 2012-01-14 ENCOUNTER — Ambulatory Visit (HOSPITAL_COMMUNITY)
Admission: RE | Admit: 2012-01-14 | Discharge: 2012-01-14 | Disposition: A | Discharge status: Home / Self Care | Payer: Medicare Other | Source: Ambulatory Visit | Attending: Internal Medicine | Admitting: Internal Medicine

## 2012-01-14 DIAGNOSIS — Z1231 Encounter for screening mammogram for malignant neoplasm of breast: Secondary | ICD-10-CM | POA: Insufficient documentation

## 2012-01-14 DIAGNOSIS — Z139 Encounter for screening, unspecified: Secondary | ICD-10-CM

## 2012-08-02 DIAGNOSIS — S065XAA Traumatic subdural hemorrhage with loss of consciousness status unknown, initial encounter: Secondary | ICD-10-CM

## 2012-08-02 DIAGNOSIS — S065X9A Traumatic subdural hemorrhage with loss of consciousness of unspecified duration, initial encounter: Secondary | ICD-10-CM

## 2012-08-02 HISTORY — DX: Traumatic subdural hemorrhage with loss of consciousness status unknown, initial encounter: S06.5XAA

## 2012-08-02 HISTORY — DX: Traumatic subdural hemorrhage with loss of consciousness of unspecified duration, initial encounter: S06.5X9A

## 2012-08-16 ENCOUNTER — Emergency Department (HOSPITAL_COMMUNITY): Payer: Medicare PPO

## 2012-08-16 ENCOUNTER — Encounter (HOSPITAL_COMMUNITY): Payer: Self-pay | Admitting: *Deleted

## 2012-08-16 ENCOUNTER — Inpatient Hospital Stay (HOSPITAL_COMMUNITY)
Admission: EM | Admit: 2012-08-16 | Discharge: 2012-08-21 | DRG: 065 | Disposition: A | Discharge status: Skilled Nursing Facility | Payer: Medicare PPO | Attending: Internal Medicine | Admitting: Internal Medicine

## 2012-08-16 DIAGNOSIS — W19XXXA Unspecified fall, initial encounter: Secondary | ICD-10-CM | POA: Diagnosis not present

## 2012-08-16 DIAGNOSIS — I10 Essential (primary) hypertension: Secondary | ICD-10-CM | POA: Diagnosis present

## 2012-08-16 DIAGNOSIS — R4182 Altered mental status, unspecified: Secondary | ICD-10-CM | POA: Diagnosis present

## 2012-08-16 DIAGNOSIS — R4701 Aphasia: Secondary | ICD-10-CM | POA: Diagnosis present

## 2012-08-16 DIAGNOSIS — E1149 Type 2 diabetes mellitus with other diabetic neurological complication: Secondary | ICD-10-CM | POA: Diagnosis present

## 2012-08-16 DIAGNOSIS — F3289 Other specified depressive episodes: Secondary | ICD-10-CM | POA: Diagnosis present

## 2012-08-16 DIAGNOSIS — Z79899 Other long term (current) drug therapy: Secondary | ICD-10-CM

## 2012-08-16 DIAGNOSIS — I6350 Cerebral infarction due to unspecified occlusion or stenosis of unspecified cerebral artery: Principal | ICD-10-CM | POA: Diagnosis present

## 2012-08-16 DIAGNOSIS — R1314 Dysphagia, pharyngoesophageal phase: Secondary | ICD-10-CM

## 2012-08-16 DIAGNOSIS — Y9210 Unspecified residential institution as the place of occurrence of the external cause: Secondary | ICD-10-CM | POA: Diagnosis not present

## 2012-08-16 DIAGNOSIS — F329 Major depressive disorder, single episode, unspecified: Secondary | ICD-10-CM | POA: Diagnosis present

## 2012-08-16 DIAGNOSIS — N390 Urinary tract infection, site not specified: Secondary | ICD-10-CM | POA: Diagnosis present

## 2012-08-16 DIAGNOSIS — IMO0002 Reserved for concepts with insufficient information to code with codable children: Secondary | ICD-10-CM | POA: Diagnosis present

## 2012-08-16 DIAGNOSIS — E1142 Type 2 diabetes mellitus with diabetic polyneuropathy: Secondary | ICD-10-CM | POA: Diagnosis present

## 2012-08-16 DIAGNOSIS — E876 Hypokalemia: Secondary | ICD-10-CM | POA: Diagnosis present

## 2012-08-16 DIAGNOSIS — S065X9A Traumatic subdural hemorrhage with loss of consciousness of unspecified duration, initial encounter: Secondary | ICD-10-CM

## 2012-08-16 DIAGNOSIS — R1310 Dysphagia, unspecified: Secondary | ICD-10-CM | POA: Diagnosis present

## 2012-08-16 DIAGNOSIS — Z8673 Personal history of transient ischemic attack (TIA), and cerebral infarction without residual deficits: Secondary | ICD-10-CM

## 2012-08-16 DIAGNOSIS — H53469 Homonymous bilateral field defects, unspecified side: Secondary | ICD-10-CM

## 2012-08-16 DIAGNOSIS — S065X0A Traumatic subdural hemorrhage without loss of consciousness, initial encounter: Secondary | ICD-10-CM | POA: Diagnosis not present

## 2012-08-16 DIAGNOSIS — D649 Anemia, unspecified: Secondary | ICD-10-CM

## 2012-08-16 DIAGNOSIS — I639 Cerebral infarction, unspecified: Secondary | ICD-10-CM | POA: Diagnosis present

## 2012-08-16 DIAGNOSIS — I63532 Cerebral infarction due to unspecified occlusion or stenosis of left posterior cerebral artery: Secondary | ICD-10-CM

## 2012-08-16 HISTORY — DX: Homonymous bilateral field defects, unspecified side: H53.469

## 2012-08-16 HISTORY — DX: Type 2 diabetes mellitus with other diabetic neurological complication: E11.49

## 2012-08-16 HISTORY — DX: Cerebral infarction due to unspecified occlusion or stenosis of left posterior cerebral artery: I63.532

## 2012-08-16 HISTORY — DX: Traumatic subdural hemorrhage with loss of consciousness of unspecified duration, initial encounter: S06.5X9A

## 2012-08-16 HISTORY — DX: Dysphagia, pharyngoesophageal phase: R13.14

## 2012-08-16 LAB — CBC WITH DIFFERENTIAL/PLATELET
Basophils Absolute: 0 10*3/uL (ref 0.0–0.1)
Basophils Relative: 0 % (ref 0–1)
Eosinophils Absolute: 0.1 10*3/uL (ref 0.0–0.7)
Hemoglobin: 11.2 g/dL — ABNORMAL LOW (ref 12.0–15.0)
MCH: 28.6 pg (ref 26.0–34.0)
MCHC: 32.5 g/dL (ref 30.0–36.0)
Monocytes Relative: 7 % (ref 3–12)
Neutrophils Relative %: 65 % (ref 43–77)
RDW: 15.8 % — ABNORMAL HIGH (ref 11.5–15.5)

## 2012-08-16 LAB — COMPREHENSIVE METABOLIC PANEL
AST: 18 U/L (ref 0–37)
CO2: 24 mEq/L (ref 19–32)
Calcium: 10.1 mg/dL (ref 8.4–10.5)
Creatinine, Ser: 0.99 mg/dL (ref 0.50–1.10)
GFR calc Af Amer: 58 mL/min — ABNORMAL LOW (ref >90)
GFR calc non Af Amer: 50 mL/min — ABNORMAL LOW (ref >90)
Glucose, Bld: 101 mg/dL — ABNORMAL HIGH (ref 70–99)
Total Protein: 7.6 g/dL (ref 6.0–8.3)

## 2012-08-16 LAB — URINALYSIS, ROUTINE W REFLEX MICROSCOPIC
Bilirubin Urine: NEGATIVE
Hgb urine dipstick: NEGATIVE
Protein, ur: NEGATIVE mg/dL
Urobilinogen, UA: 0.2 mg/dL (ref 0.0–1.0)

## 2012-08-16 LAB — PROTIME-INR
INR: 1.08 (ref 0.00–1.49)
Prothrombin Time: 13.9 seconds (ref 11.6–15.2)

## 2012-08-16 LAB — SAMPLE TO BLOOD BANK

## 2012-08-16 LAB — GLUCOSE, CAPILLARY: Glucose-Capillary: 100 mg/dL — ABNORMAL HIGH (ref 70–99)

## 2012-08-16 MED ORDER — INSULIN ASPART 100 UNIT/ML ~~LOC~~ SOLN
0.0000 [IU] | SUBCUTANEOUS | Status: DC
  Administered 2012-08-18: 1 [IU] via SUBCUTANEOUS
  Administered 2012-08-19: 7 [IU] via SUBCUTANEOUS
  Administered 2012-08-20: 3 [IU] via SUBCUTANEOUS
  Administered 2012-08-20: 1 [IU] via SUBCUTANEOUS

## 2012-08-16 MED ORDER — SODIUM CHLORIDE 0.9 % IV SOLN
INTRAVENOUS | Status: DC
  Administered 2012-08-16 – 2012-08-17 (×2): via INTRAVENOUS

## 2012-08-16 MED ORDER — ASPIRIN 300 MG RE SUPP
300.0000 mg | Freq: Every day | RECTAL | Status: DC
  Filled 2012-08-16 (×5): qty 1

## 2012-08-16 MED ORDER — ASPIRIN 325 MG PO TABS
325.0000 mg | ORAL_TABLET | Freq: Every day | ORAL | Status: DC
  Administered 2012-08-18: 325 mg via ORAL
  Filled 2012-08-16 (×2): qty 1

## 2012-08-16 NOTE — ED Notes (Signed)
EMS called to pt's home for fall - per family report to EMS pt fell last night and her elderly sister was unable to get her up from the floor for several hours till another family member arrived. Family concerned with pt's level of consciousness.  Reports to EMS that she is not responding normally.

## 2012-08-16 NOTE — H&P (Signed)
[facility] History and Physical  [name] ZOX:[id number] DOB: 02/05/1924 DOA: 08/16/2012  Referring physician: Devoria Albe, ER physician PCP: Colette Ribas, MD  Specialists: Have consulted patient's neurologist Dr.Doonquah  Chief Complaint: Confusion  HPI: [name] is a 79 y.o. female  The past medical history of hypertension, diabetes with peripheral neuropathy and previous strokes on Aggrenox who last night family noted that she looked to be a little weaker and more tired. When they tended to her this morning, they noted that she looked to be quite confused and not moving very much. She did not seem to favor one side or the other though. She seemed to keep: Of one of her daughters would not really respond properly to questions. He became quite concerned and brought her into the emergency room. CT scan head noted a subacute CVA in the left occipitoparietal region. Vital signs are otherwise unremarkable. Patient is being brought in for stroke workup and evaluation.  Review of Systems: Patient has limited response when asked about review of systems. She states her breathing is okay. She states she's feeling okay. She denies any chest pain or headache. She does not really follow to many more much questions to get full adequate review of systems.  Past Medical History  Diagnosis Date  . Stroke 2000  . Stroke 81191  . Diabetes mellitus   . Hypertension   . Erosive gastritis 08/29/2011    Per EGD  . Hiatal hernia 08/29/2011    Per EGD  . Diverticulosis 08/30/2011    Per colonoscopy  . Peripheral neuropathy 08/30/2011  . EKG abnormalities February 2013    Lateral inverted T waves   Past Surgical History  Procedure Laterality Date  . Abdominal hysterectomy    . Tonsillectomy    . Cholecystectomy    . Appendectomy    . Esophagogastroduodenoscopy  08/28/2011    Procedure: ESOPHAGOGASTRODUODENOSCOPY (EGD);  Surgeon: Malissa Hippo, MD;  [facility];   Service: Endoscopy;  Laterality: N/A;  . Colonoscopy  08/29/2011    Procedure: COLONOSCOPY;  Surgeon: Malissa Hippo, MD;  Location: [facility];  Service: Endoscopy;  Laterality: N/A;   Social History:  reports that she has never smoked. She does not have any smokeless tobacco history on file. She reports that she does not drink alcohol or use illicit drugs. Patient was at home by herself, but manages well and has multiple family members who live nearby. She uses a walker to get around, and is limited in gait with her peripheral neuropathy  No Known Allergies  Family history: Hypertension diabetes  Prior to Admission medications   Medication Sig Start Date End Date Taking? Authorizing Provider  amitriptyline (ELAVIL) 25 MG tablet Take 25 mg by mouth at bedtime. FOR SLEEP   Yes Historical Provider, MD  clidinium-chlordiazePOXIDE (LIBRAX) 2.5-5 MG per capsule Take 1 capsule by mouth at bedtime as needed. FOR GAS   Yes Historical Provider, MD  dipyridamole-aspirin (AGGRENOX) 25-200 MG per 12 hr capsule Take 1 capsule by mouth 2 (two) times daily. RESTART ON Monday September 03, 2011.   FOR STROKE PREVENTION 08/31/11  Yes Elliot Cousin, MD  Multiple Vitamins-Minerals (MULTIVITAMIN) tablet Take 1 tablet by mouth daily. 08/31/11 08/30/12 Yes Elliot Cousin, MD  pantoprazole (PROTONIX) 40 MG tablet Take 1 tablet (40 mg total) by mouth daily at 12 noon. FOR TREATMENT OF IRRITATION OF YOUR STOMACH LINING. 08/31/11 08/30/12 Yes Elliot Cousin, MD  pioglitazone (ACTOS) 30 MG tablet Take 30  mg by mouth daily. FOR DIABETES   Yes Historical Provider, MD  pregabalin (LYRICA) 50 MG capsule Take 50 mg by mouth 3 (three) times daily.   Yes Historical Provider, MD  ramipril (ALTACE) 2.5 MG capsule Take 2.5 mg by mouth daily.   Yes Historical Provider, MD  simvastatin (ZOCOR) 10 MG tablet Take 10 mg by mouth at bedtime.   Yes Historical Provider, MD   Physical Exam: Filed Vitals:   08/16/12 1400 08/16/12 1425 08/16/12 1623  08/16/12 1743  BP: 156/50  152/71   Pulse: 71  71   Temp:  98.2 F (36.8 C) 96.7 F (35.9 C)   TempSrc:      Resp: 17  20   Weight:    62.914 kg (138 lb 11.2 oz)  SpO2: 97%  98%      General:  Somewhat fatigued, but does interact with me. Answers some questions appropriately seems to follow some directions.  Eyes: Extraocular movements appear to be intact, most cranial nerves looked to be intact, although following commands somewhat difficult  ENT: Normocephalic and atraumatic, mucous members are dry  Neck: No JVD, no carotid bruits  Cardiovascular: Regular rate and rhythm, S1-S2, soft 2/6 systolic ejection murmur  Respiratory: Clear to auscultation bilaterally  Abdomen: Soft, nontender, nondistended, positive bowel sounds  Skin: No skin breaks, tears or lesions  Musculoskeletal: No clubbing or cyanosis or edema  Psychiatric: Patient looks to have some cognitive confusion, but no evidence of acute psychoses or severe delirium  Neurologic: Patient unable to follow commands looks to be generally neurologically intact. She has downgoing been ski, able to do somewhat finger to nose although response is slow. Cranial nerves looked to be intact. She is no focal 1 sided deficits other some mild decrease in grip strength on the left upper extremity, but this would not match up with her stroke. Overall to some generalized weakness nonspecific  Labs on Admission:  Basic Metabolic Panel:  Recent Labs Lab 08/16/12 1231  NA 143  K 3.5  CL 109  CO2 24  GLUCOSE 101*  BUN 22  CREATININE 0.99  CALCIUM 10.1   Liver Function Tests:  Recent Labs Lab 08/16/12 1231  AST 18  ALT 9  ALKPHOS 39  BILITOT 0.4  PROT 7.6  ALBUMIN 4.0   CBC:  Recent Labs Lab 08/16/12 1231  WBC 6.2  NEUTROABS 4.0  HGB 11.2*  HCT 34.5*  MCV 88.2  PLT 184   Cardiac Enzymes:  Recent Labs Lab 08/16/12 1231  CKTOTAL 123  TROPONINI <0.30     Radiological Exams on Admission: Ct Head Wo  Contrast  08/16/2012    IMPRESSION: Acute to subacute left occipital and posterior temporal infarct.  These results were discussed with Dr. Lynelle Doctor at the time of interpretation.   Original Report Authenticated By: Charlett Nose, M.D.     EKG: Independently reviewed. Normal sinus rhythm.  Assessment/Plan Principal Problem:   CVA (cerebral infarction): We'll initiate stroke workup. Patient already on Aggrenox. Currently n.p.o. because failed swallow evaluation. Last speech therapy to see. Dysphasia may be more of a global confusion. Rectal aspirin. Check Dopplers, echo, MRI and MRA. We'll also check A1 C. and lipid panel.  Active Problems:   Type II or unspecified type diabetes mellitus with neurological manifestations, not stated as uncontrolled(250.60): Every 4 hours sliding scale currently while n.p.o. Check A1c.    HTN (hypertension), malignant: Blood pressures actually lower than expected for subacute CVA. Continue to monitor. Holding all medications.  Altered mental status: Looks to be caused by stroke. Family reports minimal if any confusion as her baseline. Monitor closely.     Code Status: Family confirms a full code.  Family Communication: Plan discussed with patient's daughters and granddaughter.  Disposition Plan: Here in the hospital for the next few days. May need to go to short-term skilled nursing after  Time spent: 40 minutes  Hollice Espy [facility] Pager [phone]  If 7PM-7AM, please contact night-coverage [email or web address] Password [employer] 08/16/2012, 6:21 PM

## 2012-08-16 NOTE — Progress Notes (Signed)
Aspirin suppository ordered for patient d/t to patient currently NPO.  Aspirin suppository is not in the pyxis.  AC notified of need for Aspirin suppository from main pharmacy.  Oncoming night shift nurse, [employer]sa, notified that [facility] will be bringing Aspiring suppository.

## 2012-08-16 NOTE — ED Notes (Signed)
Placed on cardiac monitor, continuous pulse ox, and BP monitor.

## 2012-08-16 NOTE — ED Provider Notes (Signed)
History     This chart was scribed for Ward Givens, MD, MD by Smitty Pluck, ED Scribe. The patient was seen in room APA01/APA01 and the patient's care was started at 11:55 AM.   CSN: [id number]  Arrival date & time 08/16/12  1112      Chief Complaint  Patient presents with  . Fall    The history is provided by the EMS personnel. No language interpreter was used.   Pt is level 5 caveat due to altered mental status  [name] is a 79 y.o. female who presents to the Emergency Department BIB EMS due to family reporting pt fell last night and was found on floor due to her elderly sister not being able to help her up. Family not here and patient is not responding appropriately to questions.   Pt was admitted 1 year ago in February for generalized weakness, GI bleeding and pre syncope. The ED chart prior to admission states pt had generalized weakness but no neurological deficits. The Hospitalist's chart during states pt was alert and oriented 2x and had no focal deficits.   PCP Dr Phillips Odor    Past Medical History  Diagnosis Date  . Stroke 2000  . Stroke 40981  . Diabetes mellitus   . Hypertension   . Erosive gastritis 08/29/2011    Per EGD  . Hiatal hernia 08/29/2011    Per EGD  . Diverticulosis 08/30/2011    Per colonoscopy  . Peripheral neuropathy 08/30/2011  . EKG abnormalities February 2013    Lateral inverted T waves    Past Surgical History  Procedure Laterality Date  . Abdominal hysterectomy    . Tonsillectomy    . Cholecystectomy    . Appendectomy    . Esophagogastroduodenoscopy  08/28/2011    Procedure: ESOPHAGOGASTRODUODENOSCOPY (EGD);  Surgeon: Malissa Hippo, MD;  [facility]: [facility];  Service: Endoscopy;  Laterality: N/A;  . Colonoscopy  08/29/2011    Procedure: COLONOSCOPY;  Surgeon: Malissa Hippo, MD;  [facility];  Service: Endoscopy;  Laterality: N/A;    No family history on file.  History  Substance Use Topics  . Smoking  status: Never Smoker   . Smokeless tobacco: Not on file  . Alcohol Use: No  Lives at home Lives with elderly sister    OB History   Grav Para Term Preterm Abortions TAB SAB Ect Mult Living                  Review of Systems  Unable to perform ROS: Mental status change    Allergies  Review of patient's allergies indicates no known allergies.  Home Medications   Current Outpatient Rx  Name  Route  Sig  Dispense  Refill  . amitriptyline (ELAVIL) 25 MG tablet   Oral   Take 25 mg by mouth at bedtime. FOR SLEEP         . clidinium-chlordiazePOXIDE (LIBRAX) 2.5-5 MG per capsule   Oral   Take 1 capsule by mouth at bedtime as needed. FOR GAS         . dipyridamole-aspirin (AGGRENOX) 25-200 MG per 12 hr capsule   Oral   Take 1 capsule by mouth 2 (two) times daily. RESTART ON Monday September 03, 2011.   FOR STROKE PREVENTION         . Multiple Vitamins-Minerals (MULTIVITAMIN) tablet   Oral   Take 1 tablet by mouth daily.         Marland Kitchen  pantoprazole (PROTONIX) 40 MG tablet   Oral   Take 1 tablet (40 mg total) by mouth daily at 12 noon. FOR TREATMENT OF IRRITATION OF YOUR STOMACH LINING.   30 tablet   2   . pioglitazone (ACTOS) 30 MG tablet   Oral   Take 30 mg by mouth daily. FOR DIABETES         . pregabalin (LYRICA) 50 MG capsule   Oral   Take 50 mg by mouth 3 (three) times daily.           BP 144/46  Pulse 73  Temp(Src) 98.7 F (37.1 C) (Oral)  Resp 16  Wt 132 lb (59.875 kg)  BMI 23.39 kg/m2  SpO2 97%  Vital signs normal    Physical Exam  Nursing note and vitals reviewed. Constitutional: She appears well-developed and well-nourished. No distress.  Pt keeps repeating "wentworth" when asked questions.   HENT:  Head: Normocephalic and atraumatic.  Right Ear: External ear normal.  Left Ear: External ear normal.  Refuses to open her mouth  Eyes: Conjunctivae are normal. Pupils are equal, round, and reactive to light.  Neck: Normal range of motion.  Neck supple. No thyromegaly present.  Cardiovascular: Normal rate, regular rhythm and intact distal pulses.   Harsh systolic murmur that sounds like aortic stenosis  Pulmonary/Chest: Effort normal and breath sounds normal. No respiratory distress. She has no wheezes. She has no rales.  Abdominal: Soft. Bowel sounds are normal. She exhibits no distension.  Musculoskeletal: She exhibits no edema and no tenderness.  Neurological:  Pt is awake, but she keeps repeating "Michell Heinrich" to all questions, she has difficulty following commands Pronator drift on right  Unable to lift either leg against gravity or maintain position if  leg is lifted up by me.   Skin: Skin is warm and dry.  Psychiatric: She has a normal mood and affect. Her behavior is normal.    ED Course  Procedures (including critical care time) DIAGNOSTIC STUDIES: Oxygen Saturation is 97% on room air, normal by my interpretation.    COORDINATION OF CARE: 12:06 PM Devised treatment plan for pt care.   13:01 Dr Kearney Hard, Radiology called head CT results.   13:10 family here, states she fell about 9 pm last night and hasn't been her usual self since. She was on the floor until about 2 am when they got help getting her up off the floor (lives with elderly sister).   Review of prior records shows patient had MRI of the brain done in 2011 as well as an echocardiogram. She's also on Aggrenox already for her prior strokes.  14:15 Dr Chancy Milroy, admit to tele, team 2   Results for orders placed during the hospital encounter of 08/16/12  CBC WITH DIFFERENTIAL      Result Value Range   WBC 6.2  4.0 - 10.5 K/uL   RBC 3.91  3.87 - 5.11 MIL/uL   Hemoglobin 11.2 (*) 12.0 - 15.0 g/dL   HCT 78.2 (*) 95.6 - 21.3 %   MCV 88.2  78.0 - 100.0 fL   MCH 28.6  26.0 - 34.0 pg   MCHC 32.5  30.0 - 36.0 g/dL   RDW 08.6 (*) 57.8 - 46.9 %   Platelets 184  150 - 400 K/uL   Neutrophils Relative 65  43 - 77 %   Neutro Abs 4.0  1.7 - 7.7 K/uL    Lymphocytes Relative 27  12 - 46 %   Lymphs Abs 1.7  0.7 - 4.0 K/uL   Monocytes Relative 7  3 - 12 %   Monocytes Absolute 0.5  0.1 - 1.0 K/uL   Eosinophils Relative 1  0 - 5 %   Eosinophils Absolute 0.1  0.0 - 0.7 K/uL   Basophils Relative 0  0 - 1 %   Basophils Absolute 0.0  0.0 - 0.1 K/uL  COMPREHENSIVE METABOLIC PANEL      Result Value Range   Sodium 143  135 - 145 mEq/L   Potassium 3.5  3.5 - 5.1 mEq/L   Chloride 109  96 - 112 mEq/L   CO2 24  19 - 32 mEq/L   Glucose, Bld 101 (*) 70 - 99 mg/dL   BUN 22  6 - 23 mg/dL   Creatinine, Ser 4.54  0.50 - 1.10 mg/dL   Calcium 09.8  8.4 - 11.9 mg/dL   Total Protein 7.6  6.0 - 8.3 g/dL   Albumin 4.0  3.5 - 5.2 g/dL   AST 18  0 - 37 U/L   ALT 9  0 - 35 U/L   Alkaline Phosphatase 39  39 - 117 U/L   Total Bilirubin 0.4  0.3 - 1.2 mg/dL   GFR calc non Af Amer 50 (*) >90 mL/min   GFR calc Af Amer 58 (*) >90 mL/min  APTT      Result Value Range   aPTT 38 (*) 24 - 37 seconds  PROTIME-INR      Result Value Range   Prothrombin Time 13.9  11.6 - 15.2 seconds   INR 1.08  0.00 - 1.49  TROPONIN I      Result Value Range   Troponin I <0.30  <0.30 ng/mL  URINALYSIS, ROUTINE W REFLEX MICROSCOPIC      Result Value Range   Color, Urine YELLOW  YELLOW   APPearance CLEAR  CLEAR   Specific Gravity, Urine 1.020  1.005 - 1.030   pH 6.0  5.0 - 8.0   Glucose, UA NEGATIVE  NEGATIVE mg/dL   Hgb urine dipstick NEGATIVE  NEGATIVE   Bilirubin Urine NEGATIVE  NEGATIVE   Ketones, ur TRACE (*) NEGATIVE mg/dL   Protein, ur NEGATIVE  NEGATIVE mg/dL   Urobilinogen, UA 0.2  0.0 - 1.0 mg/dL   Nitrite NEGATIVE  NEGATIVE   Leukocytes, UA NEGATIVE  NEGATIVE  CK      Result Value Range   Total CK 123  7 - 177 U/L  SAMPLE TO BLOOD BANK      Result Value Range   Blood Bank Specimen SAMPLE AVAILABLE FOR TESTING     Sample Expiration 08/19/2012     Laboratory interpretation all normal except mild stable anemia   Ct Head Wo Contrast  08/16/2012  *RADIOLOGY  REPORT*  Clinical Data: Fall, right-sided weakness.  Altered mental status.  CT HEAD WITHOUT CONTRAST  Technique:  Contiguous axial images were obtained from the base of the skull through the vertex without contrast.  Comparison: 04/28/2010  Findings: Area of low density noted in the left occipital lobe and posterior temporal lobe compatible with infarction.  This is likely acute to subacute.  No hemorrhage.  No midline shift or mass lesion.  No hydrocephalus.  Old left thalamic lacunar infarct, stable.  No acute calvarial abnormality.  IMPRESSION: Acute to subacute left occipital and posterior temporal infarct.  These results were discussed with Dr. Lynelle Doctor at the time of interpretation.   Original Report Authenticated By: Charlett Nose, M.D.    P CXR  shows  no evidence of acute cardiopulmonary disease.    Date: 08/16/2012  Rate: 77  Rhythm: normal sinus rhythm  QRS Axis: normal  Intervals: normal  ST/T Wave abnormalities: nonspecific T wave changes  Conduction Disutrbances:none  Narrative Interpretation:   Old EKG Reviewed: unchanged from 08/27/2011    1. Stroke     Plan admission  Devoria Albe, MD, FACEP  CRITICAL CARE Performed by: Devoria Albe L   Total critical care time: 30 min   Critical care time was exclusive of separately billable procedures and treating other patients.  Critical care was necessary to treat or prevent imminent or life-threatening deterioration.  Critical care was time spent personally by me on the following activities: development of treatment plan with patient and/or surrogate as well as nursing, discussions with consultants, evaluation of patient's response to treatment, examination of patient, obtaining history from patient or surrogate, ordering and performing treatments and interventions, ordering and review of laboratory studies, ordering and review of radiographic studies, pulse oximetry and re-evaluation of patient's condition. Care   MDM    I  personally performed the services described in this documentation, which was scribed in my presence. The recorded information has been reviewed and considered.  Devoria Albe, MD, Armando Gang    Ward Givens, MD 08/16/12 [phone]

## 2012-08-17 DIAGNOSIS — D649 Anemia, unspecified: Secondary | ICD-10-CM

## 2012-08-17 LAB — LIPID PANEL: Cholesterol: 171 mg/dL (ref 0–200)

## 2012-08-17 LAB — GLUCOSE, CAPILLARY
Glucose-Capillary: 83 mg/dL (ref 70–99)
Glucose-Capillary: 76 mg/dL (ref 70–99)
Glucose-Capillary: 90 mg/dL (ref 70–99)
Glucose-Capillary: 101 mg/dL — ABNORMAL HIGH (ref 70–99)
Glucose-Capillary: 107 mg/dL — ABNORMAL HIGH (ref 70–99)

## 2012-08-17 MED ORDER — KETOROLAC TROMETHAMINE 30 MG/ML IJ SOLN: 30.0000 mg | Freq: Four times a day (QID) | INTRAMUSCULAR | Status: DC | PRN

## 2012-08-17 MED ORDER — KCL IN DEXTROSE-NACL 20-5-0.9 MEQ/L-%-% IV SOLN
INTRAVENOUS | Status: DC
  Administered 2012-08-17 (×2): via INTRAVENOUS

## 2012-08-17 MED ORDER — HYDRALAZINE HCL 20 MG/ML IJ SOLN: 10.0000 mg | Freq: Four times a day (QID) | INTRAMUSCULAR | Status: DC | PRN

## 2012-08-17 NOTE — Evaluation (Signed)
Physical Therapy Evaluation Patient Details Name: [name] MRN: [id number] DOB: 02/05/1924 Today's Date: 08/17/2012 Time: 0865-7846 PT Time Calculation (min): 65 min  PT Assessment / Plan / Recommendation Clinical Impression  Pt was able tolerate therapeutic activities/exercises today; family members present during therapy session. Patient able to amb ~48feet at Min A using a RW and has been observed to have R-sided neglect requiring cueing for safety. Pt occasionally has lapses on mentation/cognition requiring simple commands to be used. SNF placement may be appropriate to ensure safe return to PLOF and family agreed to recommendation esp pt lives alone by herself.     PT Assessment  Patient needs continued PT services    Follow Up Recommendations  SNF    Does the patient have the potential to tolerate intense rehabilitation      Barriers to Discharge Decreased caregiver support      Equipment Recommendations       Recommendations for Other Services     Frequency Min 6X/week    Precautions / Restrictions Precautions Precautions: Fall Restrictions Weight Bearing Restrictions: No         Mobility  Bed Mobility Bed Mobility: Rolling Right;Supine to Sit Rolling Right: 3: Mod assist Supine to Sit: 3: Mod assist    Exercises General Exercises - Lower Extremity Ankle Circles/Pumps: AROM;Seated;Both;10 reps Long Arc Quad: AROM;Seated;Both;10 reps Hip ABduction/ADduction: AROM;Standing;Both;10 reps Hip Flexion/Marching: AROM;Standing;Both;10 reps Mini-Sqauts: AROM;Standing;Both;10 reps   PT Diagnosis: Difficulty walking;Generalized weakness  PT Problem List: Decreased strength;Decreased activity tolerance;Decreased balance;Decreased mobility;Decreased safety awareness PT Treatment Interventions: Gait training;Stair training;Functional mobility training;Therapeutic activities;Therapeutic exercise;Balance training;Neuromuscular re-education;Cognitive  remediation;Patient/family education   PT Goals Acute Rehab PT Goals PT Goal Formulation: With patient Potential to Achieve Goals: Good Pt will go Supine/Side to Sit: with min assist PT Goal: Supine/Side to Sit - Progress: Goal set today Pt will go Sit to Supine/Side: with min assist PT Goal: Sit to Supine/Side - Progress: Goal set today Pt will go Sit to Stand: with min assist PT Goal: Sit to Stand - Progress: Goal set today Pt will go Stand to Sit: with supervision PT Goal: Stand to Sit - Progress: Goal set today Pt will Transfer Bed to Chair/Chair to Bed: with min assist PT Transfer Goal: Bed to Chair/Chair to Bed - Progress: Goal set today Pt will Ambulate: with min assist  Visit Information       Subjective Data  Subjective: I feel weak Patient Stated Goal: To return home   Prior Functioning  Home Living Lives With: Alone Available Help at Discharge: Family;Other (Comment) Type of Home: House Home Access: Stairs to enter [employer] of Steps: 4 Entrance Stairs-Rails: Right Home Layout: One level Bathroom Toilet: Standard Home Adaptive Equipment: Bedside commode/3-in-1;Walker - rolling Prior Function Level of Independence: Independent with assistive device(s) Driving: No Vocation: Retired Musician: No difficulties    Copywriter, advertising Overall Cognitive Status: Appears within functional limits for tasks assessed/performed Arousal/Alertness: Awake/alert Orientation Level: Disoriented to;Person;Place;Time;Situation Behavior During Session: [facility] for tasks performed    Extremity/Trunk Assessment Right Lower Extremity Assessment RLE ROM/Strength/Tone: Deficits Left Lower Extremity Assessment LLE ROM/Strength/Tone: Deficits   Balance Balance Balance Assessed: Yes Static Sitting Balance Static Sitting - Balance Support: No upper extremity supported;Feet supported Static Sitting - Level of Assistance: 5: Stand by assistance Dynamic  Sitting Balance Dynamic Sitting - Balance Support: Feet supported;No upper extremity supported Dynamic Sitting - Level of Assistance: 5: Stand by assistance Dynamic Sitting - Balance Activities: Reaching across midline;Forward lean/weight shifting Static  Standing Balance Static Standing - Balance Support: Bilateral upper extremity supported Static Standing - Level of Assistance: 3: Mod assist  End of Session PT - End of Session Equipment Utilized During Treatment: Gait belt Activity Tolerance: Patient tolerated treatment well Patient left: in bed;with call bell/phone within reach;with bed alarm set;with nursing in room;with family/visitor present  GP     Linell Shawn, Larna Daughters 08/17/2012, 1:04 PM

## 2012-08-17 NOTE — Progress Notes (Signed)
Pt passed bedside swallow eval.  Dr. Kerry Hough notified.

## 2012-08-17 NOTE — Progress Notes (Signed)
Pt states that she feels like there is something in her throat.  Able to drink water without difficulty.  Took aspirin with pudding.  Dr. Kerry Hough notified.

## 2012-08-17 NOTE — Progress Notes (Signed)
[facility] PROGRESS NOTE  [name] ZOX:[id number] DOB: 02/05/1924 DOA: 08/16/2012 PCP: Colette Ribas, MD  Assessment/Plan: 1. CVA.  Patient found to have subacute infarct on CT head. She was taking aggrenox prior to admission. We'll ask neurology to see for recommendations on antiplatelet therapy. Remainder of stroke workup including MRI brain, carotid Dopplers and 2-D echocardiogram are currently pending. She's been seen by physical therapy recommends skilled nursing facility placement 2. Type 2 diabetes. stable on sliding scale insulin  3. Hypertension. Holding medications due to relatively low blood pressure. 4. Altered mental status. Possibly related to stroke. Family reports some improvement.   Code Status:  full code  Family Communication:  discussed with daughters at the bedside  Disposition Plan:  skilled nursing facility    Consultants:  None   Procedures:   none   Antibiotics:   none  HPI/Subjective: Patient is feeling better today. She still has some confusion. She sitting up in bed and denies any complaints.   Objective: Filed Vitals:   08/16/12 1623 08/16/12 1743 08/16/12 2217 08/17/12 0536  BP: 152/71  150/46 155/47  Pulse: 71  73 78  Temp: 96.7 F (35.9 C)  98 F (36.7 C) 98.3 F (36.8 C)  TempSrc:   Oral Oral  Resp: 20  20 20   Weight:  62.914 kg (138 lb 11.2 oz)    SpO2: 98%  100% 100%    Intake/Output Summary (Last 24 hours) at 08/17/12 1414 Last data filed at 08/17/12 0900  Gross per 24 hour  Intake      0 ml  Output      0 ml  Net      0 ml   Filed Weights   08/16/12 1121 08/16/12 1743  Weight: 59.875 kg (132 lb) 62.914 kg (138 lb 11.2 oz)    Exam:   General:  no acute distress   Cardiovascular:  S1, S2, regular rate and rhythm   Respiratory:  clear to auscultation bilaterally   Abdomen:  soft, nontender, nondistended, bowel sounds are active   Data Reviewed: Basic Metabolic Panel:  Recent Labs Lab  08/16/12 1231  NA 143  K 3.5  CL 109  CO2 24  GLUCOSE 101*  BUN 22  CREATININE 0.99  CALCIUM 10.1   Liver Function Tests:  Recent Labs Lab 08/16/12 1231  AST 18  ALT 9  ALKPHOS 39  BILITOT 0.4  PROT 7.6  ALBUMIN 4.0   No results found for this basename: LIPASE, AMYLASE,  in the last 168 hours No results found for this basename: AMMONIA,  in the last 168 hours CBC:  Recent Labs Lab 08/16/12 1231  WBC 6.2  NEUTROABS 4.0  HGB 11.2*  HCT 34.5*  MCV 88.2  PLT 184   Cardiac Enzymes:  Recent Labs Lab 08/16/12 1231  CKTOTAL 123  TROPONINI <0.30   BNP (last 3 results) No results found for this basename: PROBNP,  in the last 8760 hours CBG:  Recent Labs Lab 08/16/12 2049 08/17/12 0922 08/17/12 1149  GLUCAP 100* 76 90    No results found for this or any previous visit (from the past 240 hour(s)).   Studies: Ct Head Wo Contrast  08/16/2012  *RADIOLOGY REPORT*  Clinical Data: Fall, right-sided weakness.  Altered mental status.  CT HEAD WITHOUT CONTRAST  Technique:  Contiguous axial images were obtained from the base of the skull through the vertex without contrast.  Comparison: 04/28/2010  Findings: Area of low density noted in the left  occipital lobe and posterior temporal lobe compatible with infarction.  This is likely acute to subacute.  No hemorrhage.  No midline shift or mass lesion.  No hydrocephalus.  Old left thalamic lacunar infarct, stable.  No acute calvarial abnormality.  IMPRESSION: Acute to subacute left occipital and posterior temporal infarct.  These results were discussed with Dr. Lynelle Doctor at the time of interpretation.   Original Report Authenticated By: Charlett Nose, M.D.     Scheduled Meds: . aspirin  300 mg Rectal Daily   Or  . aspirin  325 mg Oral Daily  . insulin aspart  0-9 Units Subcutaneous Q4H   Continuous Infusions: . sodium chloride 75 mL/hr at 08/17/12 0903  . dextrose 5 % and 0.9 % NaCl with KCl 20 mEq/L 75 mL/hr at 08/17/12 1020     Principal Problem:   CVA (cerebral infarction) Active Problems:   Type II or unspecified type diabetes mellitus with neurological manifestations, not stated as uncontrolled(250.60)   HTN (hypertension), malignant   Altered mental status    Time spent: 30 mins    MEMON,JEHANZEB  [facility] Pager [phone]. If 8PM-8AM, please contact night-coverage at [email or web address], password [facility] 08/17/2012, 2:14 PM  LOS: 1 day

## 2012-08-18 ENCOUNTER — Inpatient Hospital Stay (HOSPITAL_COMMUNITY): Payer: Medicare PPO

## 2012-08-18 DIAGNOSIS — S065X9A Traumatic subdural hemorrhage with loss of consciousness of unspecified duration, initial encounter: Secondary | ICD-10-CM | POA: Diagnosis not present

## 2012-08-18 DIAGNOSIS — I517 Cardiomegaly: Secondary | ICD-10-CM

## 2012-08-18 LAB — HEMOGLOBIN A1C
Hgb A1c MFr Bld: 6.7 % — ABNORMAL HIGH (ref <5.7)
Mean Plasma Glucose: 146 mg/dL — ABNORMAL HIGH (ref <117)

## 2012-08-18 LAB — BASIC METABOLIC PANEL
CO2: 22 mEq/L (ref 19–32)
Calcium: 8.7 mg/dL (ref 8.4–10.5)
Chloride: 108 mEq/L (ref 96–112)
Creatinine, Ser: 0.67 mg/dL (ref 0.50–1.10)
GFR calc Af Amer: 90 mL/min — ABNORMAL LOW (ref >90)
Sodium: 140 mEq/L (ref 135–145)

## 2012-08-18 LAB — GLUCOSE, CAPILLARY
Glucose-Capillary: 140 mg/dL — ABNORMAL HIGH (ref 70–99)
Glucose-Capillary: 83 mg/dL (ref 70–99)

## 2012-08-18 LAB — CBC
MCV: 88.8 fL (ref 78.0–100.0)
Platelets: 152 10*3/uL (ref 150–400)
RBC: 3.47 MIL/uL — ABNORMAL LOW (ref 3.87–5.11)
RDW: 15.7 % — ABNORMAL HIGH (ref 11.5–15.5)
WBC: 5.9 10*3/uL (ref 4.0–10.5)

## 2012-08-18 MED ORDER — SIMVASTATIN 20 MG PO TABS
20.0000 mg | ORAL_TABLET | Freq: Every day | ORAL | Status: DC
  Administered 2012-08-20: 20 mg via ORAL
  Filled 2012-08-18: qty 1

## 2012-08-18 MED ORDER — CLOPIDOGREL BISULFATE 75 MG PO TABS: 75.0000 mg | ORAL_TABLET | Freq: Every day | ORAL | Status: DC

## 2012-08-18 MED ORDER — POTASSIUM CHLORIDE 20 MEQ/15ML (10%) PO LIQD
40.0000 meq | ORAL | Status: AC
  Administered 2012-08-18 (×2): 40 meq via ORAL
  Filled 2012-08-18: qty 60

## 2012-08-18 MED ORDER — GLUCOSE 40 % PO GEL
ORAL | Status: AC
  Administered 2012-08-18: 37.5 g
  Filled 2012-08-18: qty 0.83

## 2012-08-18 NOTE — Progress Notes (Signed)
[facility] PROGRESS NOTE  FRANCIES INCH ZOX:[id number] DOB: 02/05/1924 DOA: 08/16/2012 PCP: Colette Ribas, MD  Assessment/Plan: 1. Acute CVA.  Patient found to have an acute/subacute left PCA territory infarction affecting the posteromedial temporal lobe and occipital lobe with involvement of left thalamus.  She was found to have an elevated LDL and so was started on a statin.  Blood pressure appears to be under fair control, although will need further titration. Echo is pending. Carotid arteries do not show any significant stenosis b/l.  Appreciate neurology input. She was felt not to be a tpa candidate due to delay in patient arrival. 2. Subdural hematoma s/p fall.  I was called to see the patient after she was found on the ground. She was found by staff on the floor after they heard a "thud" come from her room. It was noted that the patient was bleeding from the right parietal scalp region where she was found to have an abrasion. Patient was examined and did not appear to have any other superficial injuries.  She did not have any hip tenderness bilaterally, no other bruising appreciated. She did have some right sided upper extremity weakness, but this appears consistent with her stroke. She was able to converse and denied any complaints.  MRI of the brain was conducted which confirmed stroke, but also indicated a thin subdural hematoma along the posterior convexity on the right.  Case was discussed and MRI reviewed with Dr. Newell Coral, on call for neurosurgery. It was felt that this subdural hematoma was minimal.  Recommendations included monitoring with frequent neuro checks, Repeat CT head in the morning to track any progression, and discontinuing aspirin and plavix. Clinically, it does not appear that patient has any new neurologic findings on physical exam. I informed patients niece [name] Courts, and at her request, also spoke with the patient's grandson Loraine Leriche. I informed them that the  patient had fallen and of her MRI results. 3. Hypertension.  Blood pressure was running higher today.  She is receiving PRN hydralazine for high blood pressure. Will restart ACE for better control. 4. Dysphagia.  Seen by speech therapy.  Recommended puree diet 5. Diabetes Mellitus. Blood sugars under good control 6. Altered mental status.  Discussed with neurology and felt to be due to stroke.   Code Status: full code Family Communication: Discussed with Niece Juanetta Gosling, and grandson Loraine Leriche over the phone Disposition Plan: SNF   Consultants:  Neurology, Dr. Gerilyn Pilgrim  Telephone consult with Dr. Newell Coral, Neurosurgery  Procedures:  none  Antibiotics:  none  HPI/Subjective: Was called to see patient after fall.  She was noted to have bleeding from scalp.  Bleeding appears to have resolved. She denies any complaints.  She remains somewhat confused.  Objective: Filed Vitals:   08/18/12 1630 08/18/12 1730 08/18/12 1830 08/18/12 1924  BP: 161/70 149/71 145/77 172/74  Pulse: 66 74 70 65  Temp: 98.6 F (37 C) 98.6 F (37 C) 98.5 F (36.9 C) 98 F (36.7 C)  TempSrc: Oral Oral Oral Oral  Resp: 18 18 18 18   Weight:      SpO2: 100% 100% 100% 96%    Intake/Output Summary (Last 24 hours) at 08/18/12 1926 Last data filed at 08/18/12 0600  Gross per 24 hour  Intake 2627.5 ml  Output      0 ml  Net 2627.5 ml   Filed Weights   08/16/12 1121 08/16/12 1743 08/18/12 0523  Weight: 59.875 kg (132 lb) 62.914 kg (138 lb 11.2  oz) 61 kg (134 lb 7.7 oz)    Exam:   General:  Sitting up in chair, does not appear to be in distress  Cardiovascular: S1, S2, RRR  Respiratory: CTA B  Abdomen: soft, nt, nd, bs+, no bruising appreciated over abdomen  Skin: 5mm abraision noted over right parietal scalp, no longer bleeding  Musculoskeletal: no tenderness over hips bilaterally, no tenderness in shoulders or neck.  Neurologic: right upper extremity 2-3/5 with pronator drift (present  on admission), left upper extremity 3-4/5, bilateral lower extremities 3-4/5, extraocular motions intact, PERRL, no facial asymmetry.   Data Reviewed: Basic Metabolic Panel:  Recent Labs Lab 08/16/12 1231 08/18/12 0420  NA 143 140  K 3.5 2.9*  CL 109 108  CO2 24 22  GLUCOSE 101* 139*  BUN 22 8  CREATININE 0.99 0.67  CALCIUM 10.1 8.7   Liver Function Tests:  Recent Labs Lab 08/16/12 1231  AST 18  ALT 9  ALKPHOS 39  BILITOT 0.4  PROT 7.6  ALBUMIN 4.0   No results found for this basename: LIPASE, AMYLASE,  in the last 168 hours No results found for this basename: AMMONIA,  in the last 168 hours CBC:  Recent Labs Lab 08/16/12 1231 08/18/12 0420  WBC 6.2 5.9  NEUTROABS 4.0  --   HGB 11.2* 10.0*  HCT 34.5* 30.8*  MCV 88.2 88.8  PLT 184 152   Cardiac Enzymes:  Recent Labs Lab 08/16/12 1231  CKTOTAL 123  TROPONINI <0.30   BNP (last 3 results) No results found for this basename: PROBNP,  in the last 8760 hours CBG:  Recent Labs Lab 08/18/12 0044 08/18/12 0412 08/18/12 0755 08/18/12 1154 08/18/12 1644  GLUCAP 131* 127* 140* 122* 97    No results found for this or any previous visit (from the past 240 hour(s)).   Studies: Mr Shirlee Latch ZO Contrast  08/18/2012  *RADIOLOGY REPORT*  Clinical Data:  Stroke.  Gait disturbance.  Speech disturbance. Abnormal head CT.  MRI HEAD WITHOUT CONTRAST MRA HEAD WITHOUT CONTRAST  Technique:  Multiplanar, multiecho pulse sequences of the brain and surrounding structures were obtained without intravenous contrast. Angiographic images of the head were obtained using MRA technique without contrast.  Comparison:  Head CT 08/16/2012  MRI HEAD  Findings:  Diffusion imaging shows acute/subacute infarction in the left PCA territory affecting the posteromedial temporal lobe and occipital lobe on the left and the left thalamus.  The area of infarction shows mild swelling without evidence of acute hemorrhage or mass effect/shift.  There  are extensive old small vessel infarctions throughout the pons.  There are a few old small vessel infarctions affecting the cerebellum.  There is an old left thalamic infarction posterior to the acute infarction.  There are chronic small vessel ischemic changes throughout the cerebral hemispheric white matter.  There is a thin subdural hematoma or fluid collection along the posterior convexity on the right, maximal thickness 4 mm.  No significant mass effect.  No evidence of tumor/mass lesion, hydrocephalus or midline shift. No pituitary mass.  No inflammatory sinus disease.  IMPRESSION: Acute/subacute infarction in the left PCA territory affecting the posteromedial temporal lobe and occipital lobe on the left with involvement also of the left thalamus.  Old ischemic changes throughout the pons, cerebellum, left thalamus and hemispheric white matter.  Thin subdural hematoma along the posterior convexity on the right, maximal thickness 4 mm.  MRA HEAD  Findings: Both internal carotid arteries are patent into the brain. There is atherosclerotic  narrowing and both carotid siphon and supraclinoid ICA regions with maximal stenosis estimated at 50% on each side.  Beyond that, the anterior and middle cerebral vessels are patent but show widespread atherosclerotic irregularity with narrowing affecting multiple branch vessels.  Both vertebral arteries are patent to the basilar.  The distal vertebral arteries show narrowing and irregularity.  Maximal stenosis of the left vertebral artery is estimated at 50%.  The basilar artery shows moderate atherosclerotic irregularity but no stenosis.  There is no flow demonstrated in the left PCA, consistent with the region of acute infarction.  There is flow in the right PCA, but the vessel is severely diseased with severe stenosis and limited flow.  IMPRESSION: Severe widespread intracranial atherosclerotic disease with multiple branch vessel narrowings throughout.  Nonvisualization of  the left posterior cerebral artery, consistent with acute infarction in that vascular territory.   Original Report Authenticated By: Paulina Fusi, M.D.    Mr Brain Wo Contrast  08/18/2012  *RADIOLOGY REPORT*  Clinical Data:  Stroke.  Gait disturbance.  Speech disturbance. Abnormal head CT.  MRI HEAD WITHOUT CONTRAST MRA HEAD WITHOUT CONTRAST  Technique:  Multiplanar, multiecho pulse sequences of the brain and surrounding structures were obtained without intravenous contrast. Angiographic images of the head were obtained using MRA technique without contrast.  Comparison:  Head CT 08/16/2012  MRI HEAD  Findings:  Diffusion imaging shows acute/subacute infarction in the left PCA territory affecting the posteromedial temporal lobe and occipital lobe on the left and the left thalamus.  The area of infarction shows mild swelling without evidence of acute hemorrhage or mass effect/shift.  There are extensive old small vessel infarctions throughout the pons.  There are a few old small vessel infarctions affecting the cerebellum.  There is an old left thalamic infarction posterior to the acute infarction.  There are chronic small vessel ischemic changes throughout the cerebral hemispheric white matter.  There is a thin subdural hematoma or fluid collection along the posterior convexity on the right, maximal thickness 4 mm.  No significant mass effect.  No evidence of tumor/mass lesion, hydrocephalus or midline shift. No pituitary mass.  No inflammatory sinus disease.  IMPRESSION: Acute/subacute infarction in the left PCA territory affecting the posteromedial temporal lobe and occipital lobe on the left with involvement also of the left thalamus.  Old ischemic changes throughout the pons, cerebellum, left thalamus and hemispheric white matter.  Thin subdural hematoma along the posterior convexity on the right, maximal thickness 4 mm.  MRA HEAD  Findings: Both internal carotid arteries are patent into the brain. There is  atherosclerotic narrowing and both carotid siphon and supraclinoid ICA regions with maximal stenosis estimated at 50% on each side.  Beyond that, the anterior and middle cerebral vessels are patent but show widespread atherosclerotic irregularity with narrowing affecting multiple branch vessels.  Both vertebral arteries are patent to the basilar.  The distal vertebral arteries show narrowing and irregularity.  Maximal stenosis of the left vertebral artery is estimated at 50%.  The basilar artery shows moderate atherosclerotic irregularity but no stenosis.  There is no flow demonstrated in the left PCA, consistent with the region of acute infarction.  There is flow in the right PCA, but the vessel is severely diseased with severe stenosis and limited flow.  IMPRESSION: Severe widespread intracranial atherosclerotic disease with multiple branch vessel narrowings throughout.  Nonvisualization of the left posterior cerebral artery, consistent with acute infarction in that vascular territory.   Original Report Authenticated By: Paulina Fusi, M.D.  Scheduled Meds: . insulin aspart  0-9 Units Subcutaneous Q4H  . simvastatin  20 mg Oral q1800   Continuous Infusions: . sodium chloride 75 mL/hr at 08/17/12 0903  . dextrose 5 % and 0.9 % NaCl with KCl 20 mEq/L 75 mL/hr at 08/17/12 2342    Principal Problem:   CVA (cerebral infarction) Active Problems:   Type II or unspecified type diabetes mellitus with neurological manifestations, not stated as uncontrolled(250.60)   HTN (hypertension), malignant   Altered mental status   Subdural hematoma    Time spent:    [facility]  [facility] Pager [phone]. If 8PM-8AM, please contact night-coverage at [email or web address], password [facility] 08/18/2012, 7:26 PM  LOS: 2 days

## 2012-08-18 NOTE — Progress Notes (Signed)
Patient's CBG 66,Dr Phillips Odor notified.Orders received,and given will continue to monitor patient.

## 2012-08-18 NOTE — Evaluation (Signed)
Clinical/Bedside Swallow Evaluation Patient Details  Name: LLESENIA FOGAL MRN: [id number] Date of Birth: 02/05/1924  Today's Date: 08/18/2012 Time: 6213-0865 SLP Time Calculation (min): 24 min  Past Medical History:  Past Medical History  Diagnosis Date  . Stroke 2000  . Stroke 78469  . Diabetes mellitus   . Hypertension   . Erosive gastritis 08/29/2011    Per EGD  . Hiatal hernia 08/29/2011    Per EGD  . Diverticulosis 08/30/2011    Per colonoscopy  . Peripheral neuropathy 08/30/2011  . EKG abnormalities February 2013    Lateral inverted T waves   Past Surgical History:  Past Surgical History  Procedure Laterality Date  . Abdominal hysterectomy    . Tonsillectomy    . Cholecystectomy    . Appendectomy    . Esophagogastroduodenoscopy  08/28/2011    Procedure: ESOPHAGOGASTRODUODENOSCOPY (EGD);  Surgeon: Malissa Hippo, MD;  Location: [facility];  Service: Endoscopy;  Laterality: N/A;  . Colonoscopy  08/29/2011    Procedure: COLONOSCOPY;  Surgeon: Malissa Hippo, MD;  Location: [facility];  Service: Endoscopy;  Laterality: N/A;   HPI:  GENE COLEE is a 79 y.o. female  past medical history of hypertension, diabetes with peripheral neuropathy and previous strokes on Aggrenox who last night family noted that she looked to be a little weaker and more tired. When they tended to her this morning, they noted that she looked to be quite confused and not moving very much. She did not seem to favor one side or the other though. She seemed to keep: Of one of her daughters would not really respond properly to questions. He became quite concerned and brought her into the emergency room. CT scan head noted a subacute CVA in the left occipitoparietal region. Vital signs are otherwise unremarkable. Patient is being brought in for stroke workup and evaluation.She was found to have acute to subacute left occipital and posterior temporal infarct.   Assessment / Plan /  Recommendation Clinical Impression  Limited assessment due to decreased pt participation and willingness to consume. She appears to tolerate puree and thin liquids and refuses trials of regular textures. Recommend initiate D1/puree and thin liquids with standard aspiration and reflux precautions. SLP will follow up tomorrow for upgrades if pt willing to participate.    Aspiration Risk  Mild    Diet Recommendation Thin liquid;Dysphagia 1 (Puree)   Liquid Administration via: Cup;Straw Medication Administration: Whole meds with puree Supervision: Staff feed patient Compensations: Slow rate;Small sips/bites Postural Changes and/or Swallow Maneuvers: Seated upright 90 degrees;Upright 30-60 min after meal    Other  Recommendations Oral Care Recommendations: Oral care BID Other Recommendations: Clarify dietary restrictions   Follow Up Recommendations  Skilled Nursing facility    Frequency and Duration min 2x/week  1 week       SLP Swallow Goals Patient will consume recommended diet without observed clinical signs of aspiration with: Moderate assistance   Swallow Study Prior Functional Status   Pt was living alone, but recently required assist from family.    General Date of Onset: 08/16/12 HPI: NAJLA AUGHENBAUGH is a 79 y.o. female  past medical history of hypertension, diabetes with peripheral neuropathy and previous strokes on Aggrenox who last night family noted that she looked to be a little weaker and more tired. When they tended to her this morning, they noted that she looked to be quite confused and not moving very much. She did not seem to favor one side  or the other though. She seemed to keep: Of one of her daughters would not really respond properly to questions. He became quite concerned and brought her into the emergency room. CT scan head noted a subacute CVA in the left occipitoparietal region. Vital signs are otherwise unremarkable. Patient is being brought in for stroke  workup and evaluation.She was found to have acute to subacute left occipital and posterior temporal infarct. Type of Study: Bedside swallow evaluation Diet Prior to this Study: NPO Temperature Spikes Noted: No Respiratory Status: Supplemental O2 delivered via (comment) Behavior/Cognition: Confused;Uncooperative;Alert Oral Cavity - Dentition: Dentures, top;Dentures, bottom Self-Feeding Abilities: Able to feed self with adaptive devices Patient Positioning: Upright in bed Baseline Vocal Quality: Clear Volitional Cough: Weak Volitional Swallow: Unable to elicit    Oral/Motor/Sensory Function Overall Oral Motor/Sensory Function: Appears within functional limits for tasks assessed   Ice Chips Ice chips: Within functional limits Presentation: Spoon   Thin Liquid Thin Liquid: Within functional limits Presentation: Cup;Straw Other Comments: Pt very resistant to intake and only agreeable to very small amount despite cueing from therapist and family.    Nectar Thick Nectar Thick Liquid: Not tested   Honey Thick Honey Thick Liquid: Not tested   Puree Puree: Within functional limits Presentation: Spoon Other Comments: Again, pt only agreeable to small amount, "No, I don't want anything"   Solid   Thank you,  Havery Moros, CCC-SLP [phone]     Solid: Not tested (Pt refused)       PORTER,DABNEY 08/18/2012,5:46 PM

## 2012-08-18 NOTE — Evaluation (Signed)
Occupational Therapy Evaluation Patient Details Name: [name] MRN: [id number] DOB: 02/05/1924 Today's Date: 08/18/2012 Time: 7829-5621 OT Time Calculation (min): 30 min  OT Assessment / Plan / Recommendation Clinical Impression  Patient is a 79 y/o female s/p CVA presenting to Acute OT with deficits below. Patient will benefit from OT services to increase ADL performance, functional transfers, and Bil UE strength and endurance. Patient presents with R wide weakness and right side neglect.Recommend SNF at D/C.    OT Assessment  Patient needs continued OT Services    Follow Up Recommendations  SNF    Barriers to Discharge Decreased caregiver support Pt lives alone.  Equipment Recommendations  None recommended by OT       Frequency  Min 2X/week    Precautions / Restrictions Precautions Precautions: Fall Restrictions Weight Bearing Restrictions: No   Pertinent Vitals/Pain No complaints.    ADL  Lower Body Dressing: Performed;+1 Total assistance Toilet Transfer: Performed;Moderate assistance Toilet Transfer Method: Stand pivot Toilet Transfer Equipment: Bedside commode Toileting - Clothing Manipulation and Hygiene: Performed;+1 Total assistance Where Assessed - Glass blower/designer Manipulation and Hygiene: Standing Equipment Used: Gait belt;Rolling walker Transfers/Ambulation Related to ADLs: Patient transfers at [employer] with walker.    OT Diagnosis: Generalized weakness;Hemiplegia non-dominant side  OT Problem List: Decreased strength;Decreased range of motion;Decreased activity tolerance;Impaired balance (sitting and/or standing);Impaired vision/perception;Impaired UE functional use;Decreased knowledge of use of DME or AE;Decreased safety awareness;Decreased cognition;Decreased coordination OT Treatment Interventions: Self-care/ADL training;Therapeutic activities;Therapeutic exercise;Neuromuscular education;Energy conservation;Balance training;Patient/family  education;Visual/perceptual remediation/compensation   OT Goals Acute Rehab OT Goals OT Goal Formulation: With patient Time For Goal Achievement: 09/01/12 Potential to Achieve Goals: Good ADL Goals Pt Will Perform Grooming: with set-up;Sitting, edge of bed ADL Goal: Grooming - Progress: Goal set today Pt Will Perform Upper Body Bathing: with set-up;Sitting, edge of bed ADL Goal: Upper Body Bathing - Progress: Goal set today Pt Will Perform Lower Body Bathing: with min assist;Sit to stand from bed ADL Goal: Lower Body Bathing - Progress: Goal set today Pt Will Perform Upper Body Dressing: with set-up;Sitting, bed ADL Goal: Upper Body Dressing - Progress: Goal set today Pt Will Perform Lower Body Dressing: with min assist;Sit to stand from bed ADL Goal: Lower Body Dressing - Progress: Goal set today Pt Will Transfer to Toilet: with supervision;with DME;3-in-1 ADL Goal: Toilet Transfer - Progress: Goal set today Pt Will Perform Toileting - Clothing Manipulation: with min assist ADL Goal: Toileting - Clothing Manipulation - Progress: Goal set today Pt Will Perform Toileting - Hygiene: with min assist ADL Goal: Toileting - Hygiene - Progress: Goal set today Arm Goals Pt Will Perform AROM: with minimal assist;to maintain range of motion;Bilateral upper extremities;2 sets;10 reps Arm Goal: AROM - Progress: Goal set today Pt Will Tolerate PROM: to maintain range of motion;Right upper extremity;1 set;5 reps Arm Goal: PROM - Progress: Goal set today Pt Will Complete Theraputty Exer: with minimal assist;to increase strength;Bilateral upper extremities;Min resistance putty Arm Goal: Theraputty Exercises - Progress: Goal set today Miscellaneous OT Goals Miscellaneous OT Goal #1: Patient will increase dynamic/static sitting balance while performing a functional task seated on edge of bed with supervision. OT Goal: Miscellaneous Goal #1 - Progress: Goal set today  Visit Information  Last OT  Received On: 08/18/12 Assistance Needed: +1    Subjective Data  Subjective: "I slept good." Patient Stated Goal: To go to the bathroom.   Prior Functioning     Home Living Lives With: Alone Available Help at Discharge: Family;Other (Comment) Type  of Home: House Home Access: Stairs to enter [employer] of Steps: 4 Entrance Stairs-Rails: Right Home Layout: One level Bathroom Toilet: Standard Home Adaptive Equipment: Bedside commode/3-in-1;Walker - rolling Prior Function Level of Independence: Independent with assistive device(s) Driving: No Vocation: Retired Musician: No difficulties Dominant Hand: Left         Vision/Perception Vision - History Baseline Vision: Wears glasses only for reading Patient Visual Report: Other (comment) (Unable to assess) Vision - Assessment Vision Assessment: Vision not tested Additional Comments: Unable to assess. Patient dtowsy and unable to answer simple questions. Perception Perception: Not tested Praxis Praxis: Not tested   Cognition  Cognition Overall Cognitive Status: Difficult to assess Difficult to assess due to: Level of arousal Arousal/Alertness: Lethargic Orientation Level: Disoriented to;Place;Time;Situation Behavior During Session: Lethargic    Extremity/Trunk Assessment Right Upper Extremity Assessment RUE ROM/Strength/Tone: Deficits RUE ROM/Strength/Tone Deficits: A/ROM WFL in all ranges. MMT: 3/5 (shoulder flexion); 3-/5 (elbow flexion and extension) Weak gross grasp RUE Coordination: Deficits RUE Coordination Deficits: Impaired fine and gross motor coordination Left Upper Extremity Assessment LUE ROM/Strength/Tone: Deficits LUE ROM/Strength/Tone Deficits: A/ROM WFL in all ranges. MMT: 3/5 Weak gross grasp LUE Coordination: WFL - gross motor     Mobility Bed Mobility Bed Mobility: Supine to Sit Rolling Right: 3: Mod assist Transfers Transfers: Sit to Stand;Stand to Sit Sit to  Stand: 3: Mod assist Stand to Sit: 3: Mod assist        Balance Static Sitting Balance Static Sitting - Balance Support: Left upper extremity supported;Feet supported Static Sitting - Level of Assistance: 4: Min assist Static Sitting - Comment/# of Minutes: 1-2 mins. Patient swayed while seated and needed intermittent Min Assis to remain upright.   End of Session OT - End of Session Equipment Utilized During Treatment: Gait belt;Other (comment) (rolling walker) Activity Tolerance: Patient tolerated treatment well;Patient limited by fatigue Patient left: in chair;with call bell/phone within reach    [facility], OTR/L  08/18/2012, 11:34 AM

## 2012-08-18 NOTE — Consult Note (Signed)
[facility] Thersa Mohiuddin A. Gerilyn Pilgrim, MD     [email or web address]          [name] is an 79 y.o. female.   ASSESSMENT/PLAN: Acute left parietal occipital infarct. Deficits includes moderate aphasia, mild right-sided weakness and right homonymous hemianopia. Risk factors are hypertension, dyslipidemia, previous infarct, age and diabetes. The patient also has significant multivessel intracranial occlusive disease. I agree with switching the patient's antiplatelet regimen to aspirin. Plavix will be added. I will continue dual antiplatelet agents for 3 months and then subsequent to this she should be on a single agent. Aspirin should suffice. The patient rightly has also been placed on lipid lowering medication.  The patient 79 year old black female who has been noted to be confused and unresponsive by her family members. She also was noted to be weak likely on the right side. Imaging of the brain confirms an acute infarct. The patient apparently has been compliant with her medication. She still continues to have significant problems with confusion likely related to the stroke. This significantly limits the history. The patient does not report having any significant complaints at this time however. She denies chest pain, shows of breath or GU symptoms/GI symptoms.  GENERAL: This is a pleasant female who is in no acute distress. She incorporates with evaluation.  HEENT: Neck is supple and head is atraumatic.  ABDOMEN: soft  EXTREMITIES: No edema   BACK: Unremarkable.  SKIN: Normal by inspection.    MENTAL STATUS: The patient is awake and alert. However, she clearly has a significant moderate global aphasia. Fluency is reduced and the comprehension is significantly impaired. Naming is markedly impaired with the patient not to be name any of the 5 objects presented to her. She has significant paraphasic and word substitution errors.   CRANIAL NERVES: Pupils are equal, round and  reactive to light and accomodation; extra ocular movements are full, there is no significant nystagmus; upper and lower facial muscles are normal in strength and symmetric, there is no flattening of the nasolabial folds; tongue is midline; uvula is midline; shoulder elevation is normal. She has a right homonymous hemianopia.  MOTOR: Mild right upper extremity pronation drift. Strength seems nonfocal throughout however most being 4+ throughout.  COORDINATION: Left finger to nose is normal, right finger to nose is normal, No rest tremor; no intention tremor; no postural tremor; no bradykinesia.  REFLEXES: Deep tendon reflexes are symmetrical and normal. Plantar responses are flexor bilaterally.   SENSATION: This is unreliable but appears normal to light touch bilaterally.    Past Medical History  Diagnosis Date  . Stroke 2000  . Stroke 16109  . Diabetes mellitus   . Hypertension   . Erosive gastritis 08/29/2011    Per EGD  . Hiatal hernia 08/29/2011    Per EGD  . Diverticulosis 08/30/2011    Per colonoscopy  . Peripheral neuropathy 08/30/2011  . EKG abnormalities February 2013    Lateral inverted T waves    Past Surgical History  Procedure Laterality Date  . Abdominal hysterectomy    . Tonsillectomy    . Cholecystectomy    . Appendectomy    . Esophagogastroduodenoscopy  08/28/2011    Procedure: ESOPHAGOGASTRODUODENOSCOPY (EGD);  Surgeon: Malissa Hippo, MD;  Location: [facility];  Service: Endoscopy;  Laterality: N/A;  . Colonoscopy  08/29/2011    Procedure: COLONOSCOPY;  Surgeon: Malissa Hippo, MD;  Location: [facility];  Service: Endoscopy;  Laterality: N/A;    No family history on  file.  Social History:  reports that she has never smoked. She does not have any smokeless tobacco history on file. She reports that she does not drink alcohol or use illicit drugs.  Allergies: No Known Allergies  Medications: Prior to Admission medications   Medication Sig Start Date  End Date Taking? Authorizing Provider  amitriptyline (ELAVIL) 25 MG tablet Take 25 mg by mouth at bedtime. FOR SLEEP   Yes Historical Provider, MD  clidinium-chlordiazePOXIDE (LIBRAX) 2.5-5 MG per capsule Take 1 capsule by mouth at bedtime as needed. FOR GAS   Yes Historical Provider, MD  dipyridamole-aspirin (AGGRENOX) 25-200 MG per 12 hr capsule Take 1 capsule by mouth 2 (two) times daily. RESTART ON Monday September 03, 2011.   FOR STROKE PREVENTION 08/31/11  Yes Elliot Cousin, MD  Multiple Vitamins-Minerals (MULTIVITAMIN) tablet Take 1 tablet by mouth daily. 08/31/11 08/30/12 Yes Elliot Cousin, MD  pantoprazole (PROTONIX) 40 MG tablet Take 1 tablet (40 mg total) by mouth daily at 12 noon. FOR TREATMENT OF IRRITATION OF YOUR STOMACH LINING. 08/31/11 08/30/12 Yes Elliot Cousin, MD  pioglitazone (ACTOS) 30 MG tablet Take 30 mg by mouth daily. FOR DIABETES   Yes Historical Provider, MD  pregabalin (LYRICA) 50 MG capsule Take 50 mg by mouth 3 (three) times daily.   Yes Historical Provider, MD  ramipril (ALTACE) 2.5 MG capsule Take 2.5 mg by mouth daily.   Yes Historical Provider, MD  simvastatin (ZOCOR) 10 MG tablet Take 10 mg by mouth at bedtime.   Yes Historical Provider, MD   Scheduled Meds: . aspirin  300 mg Rectal Daily   Or  . aspirin  325 mg Oral Daily  . insulin aspart  0-9 Units Subcutaneous Q4H   Continuous Infusions: . sodium chloride 75 mL/hr at 08/17/12 0903  . dextrose 5 % and 0.9 % NaCl with KCl 20 mEq/L 75 mL/hr at 08/17/12 2342   PRN Meds:.hydrALAZINE, ketorolac  Blood pressure 122/53, pulse 69, temperature 98.5 F (36.9 C), temperature source Oral, resp. rate 18, weight 61 kg (134 lb 7.7 oz), SpO2 100.00%.   Results for orders placed during the hospital encounter of 08/16/12 (from the past 48 hour(s))  CBC WITH DIFFERENTIAL     Status: Abnormal   Collection Time    08/16/12 12:31 PM      Result Value Range   WBC 6.2  4.0 - 10.5 K/uL   RBC 3.91  3.87 - 5.11 MIL/uL   Hemoglobin 11.2  (*) 12.0 - 15.0 g/dL   HCT 16.1 (*) 09.6 - 04.5 %   MCV 88.2  78.0 - 100.0 fL   MCH 28.6  26.0 - 34.0 pg   MCHC 32.5  30.0 - 36.0 g/dL   RDW 40.9 (*) 81.1 - 91.4 %   Platelets 184  150 - 400 K/uL   Neutrophils Relative 65  43 - 77 %   Neutro Abs 4.0  1.7 - 7.7 K/uL   Lymphocytes Relative 27  12 - 46 %   Lymphs Abs 1.7  0.7 - 4.0 K/uL   Monocytes Relative 7  3 - 12 %   Monocytes Absolute 0.5  0.1 - 1.0 K/uL   Eosinophils Relative 1  0 - 5 %   Eosinophils Absolute 0.1  0.0 - 0.7 K/uL   Basophils Relative 0  0 - 1 %   Basophils Absolute 0.0  0.0 - 0.1 K/uL  SAMPLE TO BLOOD BANK     Status: None   Collection Time  08/16/12 12:31 PM      Result Value Range   Blood Bank Specimen SAMPLE AVAILABLE FOR TESTING     Sample Expiration 08/19/2012    COMPREHENSIVE METABOLIC PANEL     Status: Abnormal   Collection Time    08/16/12 12:31 PM      Result Value Range   Sodium 143  135 - 145 mEq/L   Potassium 3.5  3.5 - 5.1 mEq/L   Chloride 109  96 - 112 mEq/L   CO2 24  19 - 32 mEq/L   Glucose, Bld 101 (*) 70 - 99 mg/dL   BUN 22  6 - 23 mg/dL   Creatinine, Ser 1.61  0.50 - 1.10 mg/dL   Calcium 09.6  8.4 - 04.5 mg/dL   Total Protein 7.6  6.0 - 8.3 g/dL   Albumin 4.0  3.5 - 5.2 g/dL   AST 18  0 - 37 U/L   ALT 9  0 - 35 U/L   Alkaline Phosphatase 39  39 - 117 U/L   Total Bilirubin 0.4  0.3 - 1.2 mg/dL   GFR calc non Af Amer 50 (*) >90 mL/min   GFR calc Af Amer 58 (*) >90 mL/min   Comment:            The eGFR has been calculated     using the CKD EPI equation.     This calculation has not been     validated in all clinical     situations.     eGFR's persistently     <90 mL/min signify     possible Chronic Kidney Disease.  APTT     Status: Abnormal   Collection Time    08/16/12 12:31 PM      Result Value Range   aPTT 38 (*) 24 - 37 seconds   Comment:            IF BASELINE aPTT IS ELEVATED,     SUGGEST PATIENT RISK ASSESSMENT     BE USED TO DETERMINE APPROPRIATE      ANTICOAGULANT THERAPY.  PROTIME-INR     Status: None   Collection Time    08/16/12 12:31 PM      Result Value Range   Prothrombin Time 13.9  11.6 - 15.2 seconds   INR 1.08  0.00 - 1.49  TROPONIN I     Status: None   Collection Time    08/16/12 12:31 PM      Result Value Range   Troponin I <0.30  <0.30 ng/mL   Comment:            Due to the release kinetics of cTnI,     a negative result within the first hours     of the onset of symptoms does not rule out     myocardial infarction with certainty.     If myocardial infarction is still suspected,     repeat the test at appropriate intervals.  CK     Status: None   Collection Time    08/16/12 12:31 PM      Result Value Range   Total CK 123  7 - 177 U/L  URINALYSIS, ROUTINE W REFLEX MICROSCOPIC     Status: Abnormal   Collection Time    08/16/12 12:32 PM      Result Value Range   Color, Urine YELLOW  YELLOW   APPearance CLEAR  CLEAR   Specific Gravity, Urine 1.020  1.005 - 1.030  pH 6.0  5.0 - 8.0   Glucose, UA NEGATIVE  NEGATIVE mg/dL   Hgb urine dipstick NEGATIVE  NEGATIVE   Bilirubin Urine NEGATIVE  NEGATIVE   Ketones, ur TRACE (*) NEGATIVE mg/dL   Protein, ur NEGATIVE  NEGATIVE mg/dL   Urobilinogen, UA 0.2  0.0 - 1.0 mg/dL   Nitrite NEGATIVE  NEGATIVE   Leukocytes, UA NEGATIVE  NEGATIVE   Comment: MICROSCOPIC NOT DONE ON URINES WITH NEGATIVE PROTEIN, BLOOD, LEUKOCYTES, NITRITE, OR GLUCOSE <1000 mg/dL.  GLUCOSE, CAPILLARY     Status: Abnormal   Collection Time    08/16/12  8:49 PM      Result Value Range   Glucose-Capillary 100 (*) 70 - 99 mg/dL  GLUCOSE, CAPILLARY     Status: None   Collection Time    08/17/12  5:42 AM      Result Value Range   Glucose-Capillary 83  70 - 99 mg/dL  HEMOGLOBIN Z6X     Status: Abnormal   Collection Time    08/17/12  6:19 AM      Result Value Range   Hemoglobin A1C 6.7 (*) <5.7 %   Comment: (NOTE)                                                                                According to the ADA Clinical Practice Recommendations for 2011, when     HbA1c is used as a screening test:      >=6.5%   Diagnostic of Diabetes Mellitus               (if abnormal result is confirmed)     5.7-6.4%   Increased risk of developing Diabetes Mellitus     References:Diagnosis and Classification of Diabetes Mellitus,Diabetes     Care,2011,34(Suppl 1):S62-S69 and Standards of Medical Care in             Diabetes - 2011,Diabetes Care,2011,34 (Suppl 1):S11-S61.   Mean Plasma Glucose 146 (*) <117 mg/dL  LIPID PANEL     Status: Abnormal   Collection Time    08/17/12  6:19 AM      Result Value Range   Cholesterol 171  0 - 200 mg/dL   Triglycerides 096  <045 mg/dL   HDL 40  >40 mg/dL   Total CHOL/HDL Ratio 4.3     VLDL 21  0 - 40 mg/dL   LDL Cholesterol 981 (*) 0 - 99 mg/dL   Comment:            Total Cholesterol/HDL:CHD Risk     Coronary Heart Disease Risk Table                         Men   Women      1/2 Average Risk   3.4   3.3      Average Risk       5.0   4.4      2 X Average Risk   9.6   7.1      3 X Average Risk  23.4   11.0  Use the calculated Patient Ratio     above and the CHD Risk Table     to determine the patient's CHD Risk.                ATP III CLASSIFICATION (LDL):      <100     mg/dL   Optimal      784-696  mg/dL   Near or Above                        Optimal      130-159  mg/dL   Borderline      295-284  mg/dL   High      >132     mg/dL   Very High  GLUCOSE, CAPILLARY     Status: None   Collection Time    08/17/12  9:22 AM      Result Value Range   Glucose-Capillary 76  70 - 99 mg/dL   Comment 1 Notify RN    GLUCOSE, CAPILLARY     Status: None   Collection Time    08/17/12 11:49 AM      Result Value Range   Glucose-Capillary 90  70 - 99 mg/dL  GLUCOSE, CAPILLARY     Status: Abnormal   Collection Time    08/17/12  4:50 PM      Result Value Range   Glucose-Capillary 101 (*) 70 - 99 mg/dL   Comment 1 Notify RN    GLUCOSE,  CAPILLARY     Status: Abnormal   Collection Time    08/17/12  8:15 PM      Result Value Range   Glucose-Capillary 107 (*) 70 - 99 mg/dL  GLUCOSE, CAPILLARY     Status: Abnormal   Collection Time    08/18/12 12:44 AM      Result Value Range   Glucose-Capillary 131 (*) 70 - 99 mg/dL  GLUCOSE, CAPILLARY     Status: Abnormal   Collection Time    08/18/12  4:12 AM      Result Value Range   Glucose-Capillary 127 (*) 70 - 99 mg/dL  CBC     Status: Abnormal   Collection Time    08/18/12  4:20 AM      Result Value Range   WBC 5.9  4.0 - 10.5 K/uL   RBC 3.47 (*) 3.87 - 5.11 MIL/uL   Hemoglobin 10.0 (*) 12.0 - 15.0 g/dL   HCT 44.0 (*) 10.2 - 72.5 %   MCV 88.8  78.0 - 100.0 fL   MCH 28.8  26.0 - 34.0 pg   MCHC 32.5  30.0 - 36.0 g/dL   RDW 36.6 (*) 44.0 - 34.7 %   Platelets 152  150 - 400 K/uL  BASIC METABOLIC PANEL     Status: Abnormal   Collection Time    08/18/12  4:20 AM      Result Value Range   Sodium 140  135 - 145 mEq/L   Potassium 2.9 (*) 3.5 - 5.1 mEq/L   Comment: DELTA CHECK NOTED   Chloride 108  96 - 112 mEq/L   CO2 22  19 - 32 mEq/L   Glucose, Bld 139 (*) 70 - 99 mg/dL   BUN 8  6 - 23 mg/dL   Creatinine, Ser 4.25  0.50 - 1.10 mg/dL   Calcium 8.7  8.4 - 95.6 mg/dL   GFR calc non Af Amer 77 (*) >90 mL/min   GFR  calc Af Amer 90 (*) >90 mL/min   Comment:            The eGFR has been calculated     using the CKD EPI equation.     This calculation has not been     validated in all clinical     situations.     eGFR's persistently     <90 mL/min signify     possible Chronic Kidney Disease.  GLUCOSE, CAPILLARY     Status: Abnormal   Collection Time    08/18/12  7:55 AM      Result Value Range   Glucose-Capillary 140 (*) 70 - 99 mg/dL   Comment 1 Notify RN      Ct Head Wo Contrast  08/16/2012  *RADIOLOGY REPORT*  Clinical Data: Fall, right-sided weakness.  Altered mental status.  CT HEAD WITHOUT CONTRAST  Technique:  Contiguous axial images were obtained from the  base of the skull through the vertex without contrast.  Comparison: 04/28/2010  Findings: Area of low density noted in the left occipital lobe and posterior temporal lobe compatible with infarction.  This is likely acute to subacute.  No hemorrhage.  No midline shift or mass lesion.  No hydrocephalus.  Old left thalamic lacunar infarct, stable.  No acute calvarial abnormality.  IMPRESSION: Acute to subacute left occipital and posterior temporal infarct.  These results were discussed with Dr. Lynelle Doctor at the time of interpretation.   Original Report Authenticated By: Charlett Nose, M.D.         Kashari Chalmers A. Gerilyn Pilgrim, M.D.  Diplomate, Biomedical engineer of Psychiatry and Neurology ( Neurology). 08/18/2012, 8:44 AM

## 2012-08-18 NOTE — Progress Notes (Signed)
[facility] PROGRESS NOTE  [name] RUE:[id number] DOB: 02/05/1924 DOA: 08/16/2012 PCP: Colette Ribas, MD  Assessment/Plan: 1. CVA. Patient found to have subacute infarct on CT head. She was taking aggrenox prior to admission.  MRI brain, carotid Dopplers and 2-D echocardiogram are currently pending. Physical therapy recommends skilled nursing facility placement.   Type 2 diabetes. HgA1c 6.7. CBG range 101-140. Continue sliding scale insulin   Hypertension. Continue to hold home meds as BP 120's. Monitor closely   Altered mental status. Alert but slow to respond. Improving toward baseline.   Hypokalemia: likely related to decreased po intake of late. Will replete and recheck.    Code Status: full code Family Communication:  Disposition Plan: nursing facility when ready likely tomorrow   Consultants:  neuro  Procedures:  none  Antibiotics:  none  HPI/Subjective: Up in chair, alert. Somewhat slow to respond.  Objective: Filed Vitals:   08/17/12 0536 08/17/12 1300 08/17/12 2051 08/18/12 0523  BP: 155/47 176/65 178/61 122/53  Pulse: 78 66 74 69  Temp: 98.3 F (36.8 C) 98.2 F (36.8 C) 99.2 F (37.3 C) 98.5 F (36.9 C)  TempSrc: Oral  Oral Oral  Resp: 20 20 20 18   Weight:    61 kg (134 lb 7.7 oz)  SpO2: 100% 96% 100% 100%    Intake/Output Summary (Last 24 hours) at 08/18/12 1137 Last data filed at 08/18/12 0600  Gross per 24 hour  Intake 2627.5 ml  Output      0 ml  Net 2627.5 ml   Filed Weights   08/16/12 1121 08/16/12 1743 08/18/12 0523  Weight: 59.875 kg (132 lb) 62.914 kg (138 lb 11.2 oz) 61 kg (134 lb 7.7 oz)    Exam:   General:  Thin, frail NAD  Cardiovascular: RRR +M No GR No LEE  Respiratory: normal effort BSCTAB No wheeze  Abdomen: soft +BS non-tender to palpation   Data Reviewed: Basic Metabolic Panel:  Recent Labs Lab 08/16/12 1231 08/18/12 0420  NA 143 140  K 3.5 2.9*  CL 109 108  CO2 24 22  GLUCOSE 101*  139*  BUN 22 8  CREATININE 0.99 0.67  CALCIUM 10.1 8.7   Liver Function Tests:  Recent Labs Lab 08/16/12 1231  AST 18  ALT 9  ALKPHOS 39  BILITOT 0.4  PROT 7.6  ALBUMIN 4.0   No results found for this basename: LIPASE, AMYLASE,  in the last 168 hours No results found for this basename: AMMONIA,  in the last 168 hours CBC:  Recent Labs Lab 08/16/12 1231 08/18/12 0420  WBC 6.2 5.9  NEUTROABS 4.0  --   HGB 11.2* 10.0*  HCT 34.5* 30.8*  MCV 88.2 88.8  PLT 184 152   Cardiac Enzymes:  Recent Labs Lab 08/16/12 1231  CKTOTAL 123  TROPONINI <0.30   BNP (last 3 results) No results found for this basename: PROBNP,  in the last 8760 hours CBG:  Recent Labs Lab 08/17/12 1650 08/17/12 2015 08/18/12 0044 08/18/12 0412 08/18/12 0755  GLUCAP 101* 107* 131* 127* 140*    No results found for this or any previous visit (from the past 240 hour(s)).   Studies: Ct Head Wo Contrast  08/16/2012  *RADIOLOGY REPORT*  Clinical Data: Fall, right-sided weakness.  Altered mental status.  CT HEAD WITHOUT CONTRAST  Technique:  Contiguous axial images were obtained from the base of the skull through the vertex without contrast.  Comparison: 04/28/2010  Findings: Area of low density noted in the  left occipital lobe and posterior temporal lobe compatible with infarction.  This is likely acute to subacute.  No hemorrhage.  No midline shift or mass lesion.  No hydrocephalus.  Old left thalamic lacunar infarct, stable.  No acute calvarial abnormality.  IMPRESSION: Acute to subacute left occipital and posterior temporal infarct.  These results were discussed with Dr. Lynelle Doctor at the time of interpretation.   Original Report Authenticated By: Charlett Nose, M.D.     Scheduled Meds: . aspirin  300 mg Rectal Daily   Or  . aspirin  325 mg Oral Daily  . [START ON 08/19/2012] clopidogrel  75 mg Oral Q breakfast  . insulin aspart  0-9 Units Subcutaneous Q4H  . potassium chloride  40 mEq Oral Q4H    Continuous Infusions: . sodium chloride 75 mL/hr at 08/17/12 0903  . dextrose 5 % and 0.9 % NaCl with KCl 20 mEq/L 75 mL/hr at 08/17/12 2342    Principal Problem:   CVA (cerebral infarction) Active Problems:   Type II or unspecified type diabetes mellitus with neurological manifestations, not stated as uncontrolled(250.60)   HTN (hypertension), malignant   Altered mental status    Time spent: 30 minutes    [facility] M  [facility]  If 8PM-8AM, please contact night-coverage at [email or web address], password [facility] 08/18/2012, 11:37 AM  LOS: 2 days   Attending note:  Patient seen and examined. Please see my note for further details.

## 2012-08-18 NOTE — Progress Notes (Signed)
Bedside report given to oncoming nurse, Val.  Patient resting in bed with no new complaints.  Bed alarm on for safety.  Patient still oriented to self only as per previous am assessment. No new neurological changes.  Schonewitz, Candelaria Stagers 08/18/2012

## 2012-08-18 NOTE — Progress Notes (Signed)
*  PRELIMINARY RESULTS* Echocardiogram 2D Echocardiogram has been performed.  [name]  08/18/2012, 4:32 PM

## 2012-08-18 NOTE — Progress Notes (Signed)
Called to room by OR nursing staff.  [place], [place] RN found patient in floor after hearing a loud noise in the room next to the one she was in.  Upon entering room, found patient lying under sink with small amount of blood pooled behind head.  Assessed patient's head, VS, and assisted patient back to bed.  Notified MD of fall and asked to come assess small head abrasion.  MD arrived immediately and did not find abrasion to be bleeding at this time.  MRI already ordered for admitting dx of CVA, but called radiology staff to see if test could be expedited.  Notified family.  Will continue to monitor neuro status. Patient has been confused, nothing changed at this point in time. Schonewitz, Candelaria Stagers 08/18/2012

## 2012-08-19 ENCOUNTER — Inpatient Hospital Stay (HOSPITAL_COMMUNITY): Payer: Medicare PPO

## 2012-08-19 DIAGNOSIS — I6200 Nontraumatic subdural hemorrhage, unspecified: Secondary | ICD-10-CM

## 2012-08-19 LAB — GLUCOSE, CAPILLARY
Glucose-Capillary: 347 mg/dL — ABNORMAL HIGH (ref 70–99)
Glucose-Capillary: 344 mg/dL — ABNORMAL HIGH (ref 70–99)
Glucose-Capillary: 214 mg/dL — ABNORMAL HIGH (ref 70–99)
Glucose-Capillary: 81 mg/dL (ref 70–99)
Glucose-Capillary: 420 mg/dL — ABNORMAL HIGH (ref 70–99)

## 2012-08-19 LAB — CBC
HCT: 33.4 % — ABNORMAL LOW (ref 36.0–46.0)
Hemoglobin: 10.9 g/dL — ABNORMAL LOW (ref 12.0–15.0)
MCH: 28.6 pg (ref 26.0–34.0)
MCHC: 32.6 g/dL (ref 30.0–36.0)
MCV: 87.7 fL (ref 78.0–100.0)
RBC: 3.81 MIL/uL — ABNORMAL LOW (ref 3.87–5.11)

## 2012-08-19 LAB — BASIC METABOLIC PANEL
BUN: 7 mg/dL (ref 6–23)
CO2: 22 mEq/L (ref 19–32)
Glucose, Bld: 105 mg/dL — ABNORMAL HIGH (ref 70–99)
Potassium: 2.8 mEq/L — ABNORMAL LOW (ref 3.5–5.1)
Sodium: 141 mEq/L (ref 135–145)

## 2012-08-19 LAB — VITAMIN B12: Vitamin B-12: 1150 pg/mL — ABNORMAL HIGH (ref 211–911)

## 2012-08-19 LAB — HOMOCYSTEINE: Homocysteine: 9.3 umol/L (ref 4.0–15.4)

## 2012-08-19 MED ORDER — SODIUM CHLORIDE 0.9 % IV SOLN
INTRAVENOUS | Status: DC | PRN
  Administered 2012-08-19: 14:00:00 via INTRAVENOUS

## 2012-08-19 MED ORDER — ASPIRIN 325 MG PO TABS: 325.0000 mg | ORAL_TABLET | Freq: Every day | ORAL | Status: DC

## 2012-08-19 MED ORDER — POTASSIUM CHLORIDE 10 MEQ/100ML IV SOLN
INTRAVENOUS | Status: AC
  Administered 2012-08-19: 10 meq via INTRAVENOUS
  Filled 2012-08-19: qty 100

## 2012-08-19 MED ORDER — POTASSIUM CHLORIDE 20 MEQ/15ML (10%) PO LIQD
40.0000 meq | Freq: Once | ORAL | Status: DC
  Filled 2012-08-19: qty 30

## 2012-08-19 MED ORDER — POTASSIUM CHLORIDE 10 MEQ/100ML IV SOLN
10.0000 meq | INTRAVENOUS | Status: AC
  Administered 2012-08-19 (×6): 10 meq via INTRAVENOUS
  Filled 2012-08-19 (×5): qty 100

## 2012-08-19 MED ORDER — DEXTROSE 50 % IV SOLN: 25.0000 mL | Freq: Once | INTRAVENOUS | Status: AC | PRN

## 2012-08-19 MED ORDER — FLUOXETINE HCL 20 MG PO CAPS
20.0000 mg | ORAL_CAPSULE | Freq: Every day | ORAL | Status: DC
  Administered 2012-08-20: 20 mg via ORAL
  Filled 2012-08-19 (×3): qty 1

## 2012-08-19 MED ORDER — GLUCOSE 40 % PO GEL: 1.0000 | ORAL | Status: DC | PRN

## 2012-08-19 MED ORDER — DEXTROSE 50 % IV SOLN
INTRAVENOUS | Status: AC
  Administered 2012-08-19: 50 mL
  Filled 2012-08-19: qty 50

## 2012-08-19 MED ORDER — RAMIPRIL 2.5 MG PO CAPS
5.0000 mg | ORAL_CAPSULE | Freq: Every day | ORAL | Status: DC
  Administered 2012-08-20: 5 mg via ORAL
  Filled 2012-08-19 (×3): qty 2

## 2012-08-19 MED ORDER — PHENOL 1.4 % MT LIQD
1.0000 | OROMUCOSAL | Status: DC | PRN
  Administered 2012-08-20: 1 via OROMUCOSAL
  Filled 2012-08-19: qty 177

## 2012-08-19 MED ORDER — GLUCOSE 40 % PO GEL
ORAL | Status: AC
  Filled 2012-08-19: qty 0.83

## 2012-08-19 MED ORDER — CLOPIDOGREL BISULFATE 75 MG PO TABS: 75.0000 mg | ORAL_TABLET | Freq: Every day | ORAL | Status: DC

## 2012-08-19 NOTE — Progress Notes (Signed)
MD made aware of patient's refusal to take PO meds and to take anything by mouth, including food and drink. MD stated to hold off on PO meds at this time. Stated would speak with patient's family should she continue this way throughout today.

## 2012-08-19 NOTE — Clinical Social Work Psychosocial (Signed)
Clinical Social Work Department BRIEF PSYCHOSOCIAL ASSESSMENT 08/19/2012  Patient:  [name], [name]     Account Number:  [id number]     Admit date:  08/16/2012  Clinical Social Worker:  Nancie Neas  Date/Time:  08/19/2012 02:15 PM  Referred by:  Physician  Date Referred:  08/19/2012 Referred for  SNF Placement   Other Referral:   Interview type:  Family Other interview type:   sister and niece    PSYCHOSOCIAL DATA Living Status:  ALONE Admitted from facility:   Level of care:   Primary support name:  Lanora Manis Primary support relationship to patient:  SIBLING Degree of support available:   supportive    CURRENT CONCERNS Current Concerns  Post-Acute Placement   Other Concerns:    SOCIAL WORK ASSESSMENT / PLAN CSW attempted to meet with pt at bedside. Pt not oriented and did not want to talk. CSW spoke with pt's sister, Lanora Manis, who states pt was living alone and managing fine. She would spend a lot of time at [facility] and she provided transportation to pt's appointments. Pt is oriented at baseline by sister's report. CSW began to discuss SNF placement and she requested CSW call pt's niece [name].  [name] is aware of placement process and Medicare coverage/criteria. CSW to fax out FL2 and follow up with bed offers when available.   Assessment/plan status:  Psychosocial Support/Ongoing Assessment of Needs Other assessment/ plan:   Information/referral to community resources:   SNF list    PATIENT'S/FAMILY'S RESPONSE TO PLAN OF CARE: Pt unable to discuss plan of care. Family agreeable to SNF for rehab as pt lives alone.        Derenda Fennel, [place] [phone]

## 2012-08-19 NOTE — Progress Notes (Signed)
PT Cancellation Note  Patient Details Name: [name] MRN: [id number] DOB: 02/05/1924   Cancelled Treatment:    Reason Eval/Treat Not Completed: Fatigue/lethargy limiting ability to participate-  Pt only arousable momentarily and is very confused...she denies having a stroke and thinks she is in "[place]".  She refuses to try to wake up.  This is very concerning in light of her subdural hematoma sustained yesterday and will discuss with MD.   Konrad Penta 08/19/2012, 12:16 PM

## 2012-08-19 NOTE — Progress Notes (Signed)
UR Chart Review Completed  

## 2012-08-19 NOTE — Care Management Note (Unsigned)
    Page 1 of 1   08/20/2012     2:25:42 PM   CARE MANAGEMENT NOTE 08/20/2012  Patient:  [name], [name]   Account Number:  [id number]  Date Initiated:  08/19/2012  Documentation initiated by:  Rosemary Holms  Subjective/Objective Assessment:   Pt lived alone PTA but sister supportive. CSW speaking to family regarding rehab placement.     Action/Plan:   Anticipated DC Date:  08/21/2012   Anticipated DC Plan:  SKILLED NURSING FACILITY  In-house referral  Clinical Social Worker      DC Planning Services  CM consult      Choice offered to / List presented to:             Status of service:  In process, will continue to follow Medicare Important Message given?   (If response is "NO", the following Medicare IM given date fields will be blank) Date Medicare IM given:   Date Additional Medicare IM given:    Discharge Disposition:    Per UR Regulation:    If discussed at Long Length of Stay Meetings, dates discussed:    Comments:  08/20/12 Rosemary Holms RN BSN CM Family requests pt to go to SNF instead of Hospice. CSW [place] notified.  08/19/12 Rosemary Holms RN BSN CM

## 2012-08-19 NOTE — Progress Notes (Signed)
Patient ID: DESARAE PLACIDE, female   DOB: 02/05/1924, 79 y.o.   MRN: [id number]  [facility] NEUROLOGY Valentino Saavedra A. Gerilyn Pilgrim, MD     [email or web address]          KHAILA VELARDE is an 79 y.o. female.   Assessment/Plan: Large left occipital parietal cortical infarct. Risk factors hypertension, diabetes, age and dyslipidemia. The patient will be maintained on aspirin for 3 months and then suddenly be placed on a single agent antiplatelet medication. She also does be maintained on a statin medication. We will go ahead and check her homocysteine level and also vitamin B12 level.  Multivessel intracranial occlusive disease. Plan as outlined above.  The patient is awake and alert. She has a significant comprehensive aphasia which limits the evaluation. No complaints are reported. The aphasia causes causes significant difficulties with the history and the following commands. 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 Facial muscle strength is symmetric. She has somewhat of a global weakness throughout although clearly more on the right side. The right upper extremities particularly weak graded as 4 minus/5. The other extremities are 4+.     Objective: Vital signs in last 24 hours: Temp:  [97.7 F (36.5 C)-98.6 F (37 C)] 97.7 F (36.5 C) (02/18 0614) Pulse Rate:  [60-88] 63 (02/18 0614) Resp:  [18-20] 18 (02/18 0614) BP: (125-202)/(59-80) 125/73 mmHg (02/18 0614) SpO2:  [96 %-100 %] 97 % (02/18 0614)  Intake/Output from previous day:   Intake/Output this shift:   Nutritional status: Dysphagia   Lab Results: Results for orders placed during the hospital encounter of 08/16/12 (from the past 48 hour(s))  GLUCOSE, CAPILLARY     Status: None   Collection Time    08/17/12  9:22 AM      Result Value Range   Glucose-Capillary 76  70 - 99 mg/dL   Comment 1 Notify RN    GLUCOSE, CAPILLARY     Status: None   Collection Time    08/17/12 11:49 AM      Result Value Range   Glucose-Capillary 90  70 - 99 mg/dL  GLUCOSE, CAPILLARY     Status: Abnormal   Collection Time    08/17/12  4:50 PM      Result Value Range   Glucose-Capillary 101 (*) 70 - 99 mg/dL   Comment 1 Notify RN    GLUCOSE, CAPILLARY     Status: Abnormal   Collection Time    08/17/12  8:15 PM      Result Value Range   Glucose-Capillary 107 (*) 70 - 99 mg/dL  GLUCOSE, CAPILLARY     Status: Abnormal   Collection Time    08/18/12 12:44 AM      Result Value Range   Glucose-Capillary 131 (*) 70 - 99 mg/dL  GLUCOSE, CAPILLARY     Status: Abnormal   Collection Time    08/18/12  4:12 AM      Result Value Range   Glucose-Capillary 127 (*) 70 - 99 mg/dL  CBC     Status: Abnormal   Collection Time    08/18/12  4:20 AM      Result Value Range   WBC 5.9  4.0 - 10.5 K/uL   RBC 3.47 (*) 3.87 - 5.11 MIL/uL   Hemoglobin 10.0 (*) 12.0 - 15.0 g/dL   HCT 78.2 (*) 95.6 - 21.3 %   MCV 88.8  78.0 - 100.0 fL   MCH 28.8  26.0 - 34.0 pg   MCHC 32.5  30.0 - 36.0 g/dL   RDW 16.1 (*) 09.6 - 04.5 %   Platelets 152  150 - 400 K/uL  BASIC METABOLIC PANEL     Status: Abnormal   Collection Time    08/18/12  4:20 AM      Result Value Range   Sodium 140  135 - 145 mEq/L   Potassium 2.9 (*) 3.5 - 5.1 mEq/L   Comment: DELTA CHECK NOTED   Chloride 108  96 - 112 mEq/L   CO2 22  19 - 32 mEq/L   Glucose, Bld 139 (*) 70 - 99 mg/dL   BUN 8  6 - 23 mg/dL   Creatinine, Ser 4.09  0.50 - 1.10 mg/dL   Calcium 8.7  8.4 - 81.1 mg/dL   GFR calc non Af Amer 77 (*) >90 mL/min   GFR calc Af Amer 90 (*) >90 mL/min   Comment:            The eGFR has been calculated     using the CKD EPI equation.     This calculation has not been     validated in all clinical     situations.     eGFR's persistently     <90 mL/min signify     possible Chronic Kidney Disease.  GLUCOSE, CAPILLARY     Status: Abnormal   Collection Time    08/18/12  7:55 AM      Result Value Range   Glucose-Capillary 140 (*) 70 - 99 mg/dL   Comment 1  Notify RN    GLUCOSE, CAPILLARY     Status: Abnormal   Collection Time    08/18/12 11:54 AM      Result Value Range   Glucose-Capillary 122 (*) 70 - 99 mg/dL  GLUCOSE, CAPILLARY     Status: None   Collection Time    08/18/12  4:44 PM      Result Value Range   Glucose-Capillary 97  70 - 99 mg/dL  GLUCOSE, CAPILLARY     Status: None   Collection Time    08/18/12  8:52 PM      Result Value Range   Glucose-Capillary 83  70 - 99 mg/dL   Comment 1 Notify RN    GLUCOSE, CAPILLARY     Status: Abnormal   Collection Time    08/18/12 11:24 PM      Result Value Range   Glucose-Capillary 66 (*) 70 - 99 mg/dL   Comment 1 Notify RN    GLUCOSE, CAPILLARY     Status: Abnormal   Collection Time    08/18/12 11:47 PM      Result Value Range   Glucose-Capillary >600 (*) 70 - 99 mg/dL   Comment 1 Notify RN    GLUCOSE, CAPILLARY     Status: Abnormal   Collection Time    08/18/12 11:52 PM      Result Value Range   Glucose-Capillary 161 (*) 70 - 99 mg/dL   Comment 1 Notify RN    GLUCOSE, CAPILLARY     Status: Abnormal   Collection Time    08/19/12  4:29 AM      Result Value Range   Glucose-Capillary 347 (*) 70 - 99 mg/dL   Comment 1 Notify RN    BASIC METABOLIC PANEL     Status: Abnormal   Collection Time    08/19/12  5:47 AM      Result Value Range   Sodium 141  135 -  145 mEq/L   Potassium 2.8 (*) 3.5 - 5.1 mEq/L   Chloride 108  96 - 112 mEq/L   CO2 22  19 - 32 mEq/L   Glucose, Bld 105 (*) 70 - 99 mg/dL   BUN 7  6 - 23 mg/dL   Creatinine, Ser 4.54  0.50 - 1.10 mg/dL   Calcium 9.1  8.4 - 09.8 mg/dL   GFR calc non Af Amer 84 (*) >90 mL/min   GFR calc Af Amer >90  >90 mL/min   Comment:            The eGFR has been calculated     using the CKD EPI equation.     This calculation has not been     validated in all clinical     situations.     eGFR's persistently     <90 mL/min signify     possible Chronic Kidney Disease.  CBC     Status: Abnormal   Collection Time    08/19/12  5:47  AM      Result Value Range   WBC 5.5  4.0 - 10.5 K/uL   RBC 3.81 (*) 3.87 - 5.11 MIL/uL   Hemoglobin 10.9 (*) 12.0 - 15.0 g/dL   HCT 11.9 (*) 14.7 - 82.9 %   MCV 87.7  78.0 - 100.0 fL   MCH 28.6  26.0 - 34.0 pg   MCHC 32.6  30.0 - 36.0 g/dL   RDW 56.2  13.0 - 86.5 %   Platelets 157  150 - 400 K/uL  MAGNESIUM     Status: None   Collection Time    08/19/12  5:47 AM      Result Value Range   Magnesium 1.9  1.5 - 2.5 mg/dL  GLUCOSE, CAPILLARY     Status: Abnormal   Collection Time    08/19/12  7:39 AM      Result Value Range   Glucose-Capillary 344 (*) 70 - 99 mg/dL   Comment 1 Notify RN      Lipid Panel  Recent Labs  08/17/12 0619  CHOL 171  TRIG 104  HDL 40  CHOLHDL 4.3  VLDL 21  LDLCALC 784*    Studies/Results: Mr Maxine Glenn Head Wo Contrast  08/18/2012  *RADIOLOGY REPORT*  Clinical Data:  Stroke.  Gait disturbance.  Speech disturbance. Abnormal head CT.  MRI HEAD WITHOUT CONTRAST MRA HEAD WITHOUT CONTRAST  Technique:  Multiplanar, multiecho pulse sequences of the brain and surrounding structures were obtained without intravenous contrast. Angiographic images of the head were obtained using MRA technique without contrast.  Comparison:  Head CT 08/16/2012  MRI HEAD  Findings:  Diffusion imaging shows acute/subacute infarction in the left PCA territory affecting the posteromedial temporal lobe and occipital lobe on the left and the left thalamus.  The area of infarction shows mild swelling without evidence of acute hemorrhage or mass effect/shift.  There are extensive old small vessel infarctions throughout the pons.  There are a few old small vessel infarctions affecting the cerebellum.  There is an old left thalamic infarction posterior to the acute infarction.  There are chronic small vessel ischemic changes throughout the cerebral hemispheric white matter.  There is a thin subdural hematoma or fluid collection along the posterior convexity on the right, maximal thickness 4 mm.  No  significant mass effect.  No evidence of tumor/mass lesion, hydrocephalus or midline shift. No pituitary mass.  No inflammatory sinus disease.  IMPRESSION: Acute/subacute infarction in the left  PCA territory affecting the posteromedial temporal lobe and occipital lobe on the left with involvement also of the left thalamus.  Old ischemic changes throughout the pons, cerebellum, left thalamus and hemispheric white matter.  Thin subdural hematoma along the posterior convexity on the right, maximal thickness 4 mm.  MRA HEAD  Findings: Both internal carotid arteries are patent into the brain. There is atherosclerotic narrowing and both carotid siphon and supraclinoid ICA regions with maximal stenosis estimated at 50% on each side.  Beyond that, the anterior and middle cerebral vessels are patent but show widespread atherosclerotic irregularity with narrowing affecting multiple branch vessels.  Both vertebral arteries are patent to the basilar.  The distal vertebral arteries show narrowing and irregularity.  Maximal stenosis of the left vertebral artery is estimated at 50%.  The basilar artery shows moderate atherosclerotic irregularity but no stenosis.  There is no flow demonstrated in the left PCA, consistent with the region of acute infarction.  There is flow in the right PCA, but the vessel is severely diseased with severe stenosis and limited flow.  IMPRESSION: Severe widespread intracranial atherosclerotic disease with multiple branch vessel narrowings throughout.  Nonvisualization of the left posterior cerebral artery, consistent with acute infarction in that vascular territory.   Original Report Authenticated By: Paulina Fusi, M.D.    Mr Brain Wo Contrast  08/18/2012  *RADIOLOGY REPORT*  Clinical Data:  Stroke.  Gait disturbance.  Speech disturbance. Abnormal head CT.  MRI HEAD WITHOUT CONTRAST MRA HEAD WITHOUT CONTRAST  Technique:  Multiplanar, multiecho pulse sequences of the brain and surrounding structures  were obtained without intravenous contrast. Angiographic images of the head were obtained using MRA technique without contrast.  Comparison:  Head CT 08/16/2012  MRI HEAD  Findings:  Diffusion imaging shows acute/subacute infarction in the left PCA territory affecting the posteromedial temporal lobe and occipital lobe on the left and the left thalamus.  The area of infarction shows mild swelling without evidence of acute hemorrhage or mass effect/shift.  There are extensive old small vessel infarctions throughout the pons.  There are a few old small vessel infarctions affecting the cerebellum.  There is an old left thalamic infarction posterior to the acute infarction.  There are chronic small vessel ischemic changes throughout the cerebral hemispheric white matter.  There is a thin subdural hematoma or fluid collection along the posterior convexity on the right, maximal thickness 4 mm.  No significant mass effect.  No evidence of tumor/mass lesion, hydrocephalus or midline shift. No pituitary mass.  No inflammatory sinus disease.  IMPRESSION: Acute/subacute infarction in the left PCA territory affecting the posteromedial temporal lobe and occipital lobe on the left with involvement also of the left thalamus.  Old ischemic changes throughout the pons, cerebellum, left thalamus and hemispheric white matter.  Thin subdural hematoma along the posterior convexity on the right, maximal thickness 4 mm.  MRA HEAD  Findings: Both internal carotid arteries are patent into the brain. There is atherosclerotic narrowing and both carotid siphon and supraclinoid ICA regions with maximal stenosis estimated at 50% on each side.  Beyond that, the anterior and middle cerebral vessels are patent but show widespread atherosclerotic irregularity with narrowing affecting multiple branch vessels.  Both vertebral arteries are patent to the basilar.  The distal vertebral arteries show narrowing and irregularity.  Maximal stenosis of the  left vertebral artery is estimated at 50%.  The basilar artery shows moderate atherosclerotic irregularity but no stenosis.  There is no flow demonstrated in the left PCA,  consistent with the region of acute infarction.  There is flow in the right PCA, but the vessel is severely diseased with severe stenosis and limited flow.  IMPRESSION: Severe widespread intracranial atherosclerotic disease with multiple branch vessel narrowings throughout.  Nonvisualization of the left posterior cerebral artery, consistent with acute infarction in that vascular territory.   Original Report Authenticated By: Paulina Fusi, M.D.     Medications:  Scheduled Meds: . dextrose      . insulin aspart  0-9 Units Subcutaneous Q4H  . potassium chloride  10 mEq Intravenous Q1 Hr x 6  . potassium chloride  40 mEq Oral Once  . simvastatin  20 mg Oral q1800   Continuous Infusions: . dextrose 5 % and 0.9 % NaCl with KCl 20 mEq/L 75 mL/hr at 08/17/12 2342   PRN Meds:.dextrose, hydrALAZINE, ketorolac    LOS: 3 days   Averleigh Savary A. Gerilyn Pilgrim, M.D.  Diplomate, Biomedical engineer of Psychiatry and Neurology ( Neurology).

## 2012-08-19 NOTE — Progress Notes (Signed)
Speech Language Pathology Dysphagia Treatment Patient Details Name: [name] MRN: [id number] DOB: 02/05/1924 Today's Date: 08/19/2012 Time: 7253-6644 SLP Time Calculation (min): 18 min  Assessment / Plan / Recommendation Clinical Impression  I attempted to see [name] two times today for po trials and cognitive linguistic evaluation. She understands basic conversation and is able to express her wishes, but remains confused and adamantly refuses po. She did tell me that it hurts to swallow, but won't allow me to look in her mouth or even take an ice chip. At this point, her dysphagia has a cognitive basis and she is managing secretions and swallowed some water yesterday without incident. I think it would be appropriate for family members to bring in some foods they know she likes and encourage her to eat if nurse present. Her daughter is unable to come until 4 pm tomorrow, but another family member will be coming earlier and will bring some food. [name] says that she "will eat tomorrow".  SLP not here tomorrow but will follow up on Thursday.    Diet Recommendation  Continue with Current Diet: Dysphagia 1 (puree);Thin liquid    SLP Plan Continue with current plan of care      Swallowing Goals  SLP Swallowing Goals Patient will consume recommended diet without observed clinical signs of aspiration with: Moderate assistance Swallow Study Goal #1 - Progress: Progressing toward goal  General Temperature Spikes Noted: No Respiratory Status: Supplemental O2 delivered via (comment) Behavior/Cognition: Alert;Requires cueing Oral Cavity - Dentition: Dentures, top;Dentures, bottom Patient Positioning: Upright in bed  Oral Cavity - Oral Hygiene Does patient have any of the following "at risk" factors?: Other - dysphagia;Oxygen therapy - cannula, mask, simple oxygen devices;Nutritional status - inadequate Brush patient's teeth BID with toothbrush (using toothpaste with fluoride):  Yes   Dysphagia Treatment Treatment focused on: Patient/family/caregiver education Family/Caregiver Educated: Daughter, [name], son present Patient observed directly with PO's: No Reason PO's not observed: Refused Type of cueing: Verbal;Tactile;Visual Amount of cueing: Total      PORTER,DABNEY 08/19/2012, 5:55 PM

## 2012-08-19 NOTE — Progress Notes (Signed)
I did have a discussion with the hospitalist,and it appears that the patient did have a small subdural after a fall last night. Neurosurgery is recommended discontinuing the aspirin and Plavix which I think is appropriate. She does have a lot of issues with eating. Apparently she is not even taking her medications. A soft/pured diet is spinning suggested by speech therapist which I think is fine. I would hold her hold off on putting a feeding tube in for now. 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 She is to follow-up with our office in 2-4 weeks for further assessment. The aspirin/Plavix can be restarted month from now if her subdural has not increased.

## 2012-08-19 NOTE — Progress Notes (Addendum)
[facility] PROGRESS NOTE  [name] RUE:[id number] DOB: 02/05/1924 DOA: 08/16/2012 PCP: Colette Ribas, MD  Assessment/Plan: 1. Acute CVA. Patient found to have an acute/subacute left PCA territory infarction affecting the posteromedial temporal lobe and occipital lobe with involvement of left thalamus. She was found to have an elevated LDL and so was started on a statin.  Carotid arteries do not show any significant stenosis b/l. Fair control of BP. Appreciate neurology input. She was felt not to be a tpa candidate due to delay in patient arrival. PT to eval. SW initiating placement 2. Subdural hematoma s/p fall.  Patient was examined and did not appear to have any other superficial injuries. MRI of the brain was conducted which confirmed stroke, but also indicated a thin subdural hematoma along the posterior convexity on the right. Case was discussed and MRI reviewed with Dr. Newell Coral, on call for neurosurgery. It was felt that this subdural hematoma was minimal. Recommended repeat CT which is pending. Neuro exam stable 3. Hypertension. Blood pressure with better control.  4. Dysphagia. Seen by speech therapy. Recommended puree diet. Complained "soreness" with swallowing. Will provide oral spray. 5. Diabetes Mellitus. Blood sugars fair control 6. Altered mental status. Discussed with neurology and felt to be due to stroke. Family reports close to baseline. Pt slow to respond but clear.     Code Status: full Family Communication:  Disposition Plan: SNF hopefully tomorrow   Consultants:  Neurosurgery  Neurology   Procedures:  none  Antibiotics:  none  HPI/Subjective: Awake alert complained of sore throat. Denies any other pain/discomfort  Objective: Filed Vitals:   08/19/12 0110 08/19/12 0614 08/19/12 0844 08/19/12 1159  BP: 190/62 125/73 165/65 185/74  Pulse: 64 63 71 64  Temp: 98.1 F (36.7 C) 97.7 F (36.5 C)  98 F (36.7 C)  TempSrc: Oral Axillary   Oral  Resp: 19 18  18   Weight:      SpO2: 98% 97% 97% 97%    Intake/Output Summary (Last 24 hours) at 08/19/12 1423 Last data filed at 08/19/12 1025  Gross per 24 hour  Intake    200 ml  Output      0 ml  Net    200 ml   Filed Weights   08/16/12 1121 08/16/12 1743 08/18/12 0523  Weight: 59.875 kg (132 lb) 62.914 kg (138 lb 11.2 oz) 61 kg (134 lb 7.7 oz)    Exam:   General:  Awake frail appearing NAD  Cardiovascular: RRR No LEE PPP  Respiratory: normal effort BSCTAB No wheeze  Abdomen: soft +BS non-tender to palpation  Neck: tender to palpation. No lymphadenopathy full rom  Data Reviewed: Basic Metabolic Panel:  Recent Labs Lab 08/16/12 1231 08/18/12 0420 08/19/12 0547  NA 143 140 141  K 3.5 2.9* 2.8*  CL 109 108 108  CO2 24 22 22   GLUCOSE 101* 139* 105*  BUN 22 8 7   CREATININE 0.99 0.67 0.53  CALCIUM 10.1 8.7 9.1  MG  --   --  1.9   Liver Function Tests:  Recent Labs Lab 08/16/12 1231  AST 18  ALT 9  ALKPHOS 39  BILITOT 0.4  PROT 7.6  ALBUMIN 4.0   No results found for this basename: LIPASE, AMYLASE,  in the last 168 hours No results found for this basename: AMMONIA,  in the last 168 hours CBC:  Recent Labs Lab 08/16/12 1231 08/18/12 0420 08/19/12 0547  WBC 6.2 5.9 5.5  NEUTROABS 4.0  --   --  HGB 11.2* 10.0* 10.9*  HCT 34.5* 30.8* 33.4*  MCV 88.2 88.8 87.7  PLT 184 152 157   Cardiac Enzymes:  Recent Labs Lab 08/16/12 1231  CKTOTAL 123  TROPONINI <0.30   BNP (last 3 results) No results found for this basename: PROBNP,  in the last 8760 hours CBG:  Recent Labs Lab 08/19/12 0739 08/19/12 0817 08/19/12 0950 08/19/12 1156 08/19/12 1257  GLUCAP 344* 50* 214* 124* 81    No results found for this or any previous visit (from the past 240 hour(s)).   Studies: Mr Shirlee Latch ZO Contrast  08/18/2012  *RADIOLOGY REPORT*  Clinical Data:  Stroke.  Gait disturbance.  Speech disturbance. Abnormal head CT.  MRI HEAD WITHOUT CONTRAST  MRA HEAD WITHOUT CONTRAST  Technique:  Multiplanar, multiecho pulse sequences of the brain and surrounding structures were obtained without intravenous contrast. Angiographic images of the head were obtained using MRA technique without contrast.  Comparison:  Head CT 08/16/2012  MRI HEAD  Findings:  Diffusion imaging shows acute/subacute infarction in the left PCA territory affecting the posteromedial temporal lobe and occipital lobe on the left and the left thalamus.  The area of infarction shows mild swelling without evidence of acute hemorrhage or mass effect/shift.  There are extensive old small vessel infarctions throughout the pons.  There are a few old small vessel infarctions affecting the cerebellum.  There is an old left thalamic infarction posterior to the acute infarction.  There are chronic small vessel ischemic changes throughout the cerebral hemispheric white matter.  There is a thin subdural hematoma or fluid collection along the posterior convexity on the right, maximal thickness 4 mm.  No significant mass effect.  No evidence of tumor/mass lesion, hydrocephalus or midline shift. No pituitary mass.  No inflammatory sinus disease.  IMPRESSION: Acute/subacute infarction in the left PCA territory affecting the posteromedial temporal lobe and occipital lobe on the left with involvement also of the left thalamus.  Old ischemic changes throughout the pons, cerebellum, left thalamus and hemispheric white matter.  Thin subdural hematoma along the posterior convexity on the right, maximal thickness 4 mm.  MRA HEAD  Findings: Both internal carotid arteries are patent into the brain. There is atherosclerotic narrowing and both carotid siphon and supraclinoid ICA regions with maximal stenosis estimated at 50% on each side.  Beyond that, the anterior and middle cerebral vessels are patent but show widespread atherosclerotic irregularity with narrowing affecting multiple branch vessels.  Both vertebral arteries  are patent to the basilar.  The distal vertebral arteries show narrowing and irregularity.  Maximal stenosis of the left vertebral artery is estimated at 50%.  The basilar artery shows moderate atherosclerotic irregularity but no stenosis.  There is no flow demonstrated in the left PCA, consistent with the region of acute infarction.  There is flow in the right PCA, but the vessel is severely diseased with severe stenosis and limited flow.  IMPRESSION: Severe widespread intracranial atherosclerotic disease with multiple branch vessel narrowings throughout.  Nonvisualization of the left posterior cerebral artery, consistent with acute infarction in that vascular territory.   Original Report Authenticated By: Paulina Fusi, M.D.    Mr Brain Wo Contrast  08/18/2012  *RADIOLOGY REPORT*  Clinical Data:  Stroke.  Gait disturbance.  Speech disturbance. Abnormal head CT.  MRI HEAD WITHOUT CONTRAST MRA HEAD WITHOUT CONTRAST  Technique:  Multiplanar, multiecho pulse sequences of the brain and surrounding structures were obtained without intravenous contrast. Angiographic images of the head were obtained using MRA technique  without contrast.  Comparison:  Head CT 08/16/2012  MRI HEAD  Findings:  Diffusion imaging shows acute/subacute infarction in the left PCA territory affecting the posteromedial temporal lobe and occipital lobe on the left and the left thalamus.  The area of infarction shows mild swelling without evidence of acute hemorrhage or mass effect/shift.  There are extensive old small vessel infarctions throughout the pons.  There are a few old small vessel infarctions affecting the cerebellum.  There is an old left thalamic infarction posterior to the acute infarction.  There are chronic small vessel ischemic changes throughout the cerebral hemispheric white matter.  There is a thin subdural hematoma or fluid collection along the posterior convexity on the right, maximal thickness 4 mm.  No significant mass  effect.  No evidence of tumor/mass lesion, hydrocephalus or midline shift. No pituitary mass.  No inflammatory sinus disease.  IMPRESSION: Acute/subacute infarction in the left PCA territory affecting the posteromedial temporal lobe and occipital lobe on the left with involvement also of the left thalamus.  Old ischemic changes throughout the pons, cerebellum, left thalamus and hemispheric white matter.  Thin subdural hematoma along the posterior convexity on the right, maximal thickness 4 mm.  MRA HEAD  Findings: Both internal carotid arteries are patent into the brain. There is atherosclerotic narrowing and both carotid siphon and supraclinoid ICA regions with maximal stenosis estimated at 50% on each side.  Beyond that, the anterior and middle cerebral vessels are patent but show widespread atherosclerotic irregularity with narrowing affecting multiple branch vessels.  Both vertebral arteries are patent to the basilar.  The distal vertebral arteries show narrowing and irregularity.  Maximal stenosis of the left vertebral artery is estimated at 50%.  The basilar artery shows moderate atherosclerotic irregularity but no stenosis.  There is no flow demonstrated in the left PCA, consistent with the region of acute infarction.  There is flow in the right PCA, but the vessel is severely diseased with severe stenosis and limited flow.  IMPRESSION: Severe widespread intracranial atherosclerotic disease with multiple branch vessel narrowings throughout.  Nonvisualization of the left posterior cerebral artery, consistent with acute infarction in that vascular territory.   Original Report Authenticated By: Paulina Fusi, M.D.     Scheduled Meds: . aspirin  325 mg Oral Daily  . [START ON 08/20/2012] clopidogrel  75 mg Oral Q breakfast  . dextrose      . insulin aspart  0-9 Units Subcutaneous Q4H  . potassium chloride  40 mEq Oral Once  . simvastatin  20 mg Oral q1800   Continuous Infusions: . dextrose 5 % and 0.9 %  NaCl with KCl 20 mEq/L 50 mL/hr at 08/19/12 8119    Principal Problem:   CVA (cerebral infarction) Active Problems:   Type II or unspecified type diabetes mellitus with neurological manifestations, not stated as uncontrolled(250.60)   HTN (hypertension), malignant   Altered mental status   Subdural hematoma    Time spent: 30 minutes    [facility]  [facility]  If 8PM-8AM, please contact night-coverage at [email or web address], password [facility] 08/19/2012, 2:23 PM  LOS: 3 days   Attending note:  Patient seen and examined.  Above note reviewed.  Repeat CT of the head did not show any worsening findings. Carotid duplex is pending. Case was discussed with Dr. Gerilyn Pilgrim. He agrees with stopping aspirin and Plavix for now. The patient has had very poor by mouth intake since last Friday according to family. In the hospital she is refusing to take  any pills or have any food. She reports that it causes pain with swallowing. She has been given an anesthetic spray. Family has been encouraged to bring in food that she enjoys eating. She has been maintained on dextrose infusion due to persistent hypoglycemia, likely secondary to decreased by mouth intake. 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 Prozac was recommended. I did have a discussion with the family regarding her nutritional status. Considering her decreased by mouth intake makes her prognosis poor. The possibility of PEG tube was considered. I did not recommend placing a PEG tube in her current state. I will meet with the family again tomorrow to further discuss goals of care. We'll discontinue dextrose infusion to see if she can maintain her blood sugars. Potassium has been replaced.

## 2012-08-19 NOTE — Progress Notes (Signed)
Hypoglycemic Event  CBG: 50  Treatment: D50 IV 50 mL  Symptoms: Nervous/irritable  Follow-up CBG: Time:  0950 CBG Result: 214  Possible Reasons for Event: Inadequate meal intake  Comments/MD notified: Pt resting at this time, Orders placed for hypoglycemia protocol. Will continue to monitor.    [name], Joyice Faster  Remember to initiate Hypoglycemia Order Set & complete

## 2012-08-19 NOTE — Clinical Social Work Placement (Addendum)
Clinical Social Work Department CLINICAL SOCIAL WORK PLACEMENT NOTE 08/19/2012  Patient:  [name], [name]  Account Number:  [id number] Admit date:  08/16/2012  Clinical Social Worker:  Derenda Fennel, LCSW  Date/time:  08/19/2012 02:10 PM  Clinical Social Work is seeking post-discharge placement for this patient at the following level of care:   SKILLED NURSING   (*CSW will update this form in Epic as items are completed)   08/19/2012  Patient/family provided with [facility] Department of Clinical Social Work's list of facilities offering this level of care within the geographic area requested by the patient (or if unable, by the patient's family).  08/19/2012  Patient/family informed of their freedom to choose among providers that offer the needed level of care, that participate in Medicare, Medicaid or managed care program needed by the patient, have an available bed and are willing to accept the patient.  08/19/2012  Patient/family informed of [facility]' ownership interest in [facility], as well as of the fact that they are under no obligation to receive care at this facility.  PASARR submitted to [employer] on  PASARR number received from [employer] on   FL2 transmitted to all facilities in geographic area requested by pt/family on  08/19/2012 FL2 transmitted to all facilities within larger geographic area on   Patient informed that his/her managed care company has contracts with or will negotiate with  certain facilities, including the following:  Patient has existing PASARR number   Patient/family informed of bed offers received: 08/21/12  Patient chooses bed at [facility] Physician recommends and patient chooses bed at    Patient to be transferred [facility]  on  08/21/12 Patient to be transferred to facility by wheelchair  The following physician request were entered in Epic:   Additional Comments: previous pasarr number  Derenda Fennel,  [place] [phone]  08/21/12 Patient is ok per MD for d/c today to SNF. Spoke to patient's niece [name] and bed offers given. She requests the [facility].  DC arrangements completed;  Notified patient's nurse of d/c plan.  Lorri Frederick. [place]  [phone]

## 2012-08-20 DIAGNOSIS — E876 Hypokalemia: Secondary | ICD-10-CM

## 2012-08-20 LAB — BASIC METABOLIC PANEL
CO2: 21 mEq/L (ref 19–32)
Calcium: 9.1 mg/dL (ref 8.4–10.5)
GFR calc non Af Amer: 81 mL/min — ABNORMAL LOW (ref >90)
Potassium: 2.9 mEq/L — ABNORMAL LOW (ref 3.5–5.1)
Sodium: 138 mEq/L (ref 135–145)

## 2012-08-20 LAB — GLUCOSE, CAPILLARY
Glucose-Capillary: 88 mg/dL (ref 70–99)
Glucose-Capillary: 89 mg/dL (ref 70–99)

## 2012-08-20 MED ORDER — POTASSIUM CHLORIDE 10 MEQ/100ML IV SOLN
10.0000 meq | INTRAVENOUS | Status: DC
  Administered 2012-08-20: 10 meq via INTRAVENOUS
  Filled 2012-08-20: qty 100

## 2012-08-20 MED ORDER — MAGNESIUM SULFATE 40 MG/ML IJ SOLN: 2.0000 g | Freq: Once | INTRAMUSCULAR | Status: DC

## 2012-08-20 MED ORDER — POTASSIUM CHLORIDE 20 MEQ/15ML (10%) PO LIQD
40.0000 meq | ORAL | Status: AC
  Administered 2012-08-20: 40 meq via ORAL
  Filled 2012-08-20: qty 30

## 2012-08-20 NOTE — Progress Notes (Signed)
Patient refused finger stick and 8p scheduled med.. Patient became combative. Daughter at bedside.

## 2012-08-20 NOTE — Progress Notes (Signed)
OT Cancellation Note  Patient Details Name: KAHLIYA FRALEIGH MRN: [id number] DOB: 02/05/1924   Cancelled Treatment:    Reason Eval/Treat Not Completed: Other (comment) (Patient refused to participate in therapy this date.) Will re-attempt when able.   Limmie Patricia, OTR/L  08/20/2012, 11:21 AM

## 2012-08-20 NOTE — Progress Notes (Signed)
[facility] PROGRESS NOTE  [name] JYN:[id number] DOB: 02/05/1924 DOA: 08/16/2012 PCP: Colette Ribas, MD  Assessment/plan  1. Acute CVA. Patient found to have an acute/subacute left PCA territory infarction affecting the posteromedial temporal lobe and occipital lobe with involvement of left thalamus. She was found to have an elevated LDL and so was started on a statin. Carotid arteries do not show any significant stenosis b/l. Fair control of BP. Appreciate neurology input. She was felt not to be a tpa candidate due to delay in patient arrival. Continues with little interest in eating. 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 Prozac started 08/19/12. SW initiating placement process 2. Subdural hematoma s/p fall. Patient was examined and did not appear to have any other superficial injuries. MRI of the brain was conducted which confirmed stroke, but also indicated a thin subdural hematoma along the posterior convexity on the right. Case was discussed and MRI reviewed with Dr. Newell Coral, on call for neurosurgery. It was felt that this subdural hematoma was minimal. Repeat CT worsening findings. Neuro exam remain stable with some right sided weakness as result of #1. Neuro opines that pt will need to be on antiplatelet agent again. Recommending repeat CT in 4 weeks. If ok and resolution of hemorrhage, pt can be restarted on antiplatelet agent.  3. Hypertension. Blood pressure with better control. SBP range 125-180. Monitor 4. Dysphagia. Seen by speech therapy. Recommended puree diet. Managing secretions without problem. Has taken ice chips without problem. Pt states she will eat today when family bring in food she likes. Will monitor  5. Diabetes Mellitus. Blood sugars fair control. CBG range 88-209. 6. Altered mental status. Discussed with neurology and felt to be due to stroke. See neuro note.  Pt slow to respond but clear. Per neuro pt has a dense  right homonymous hemianopia and should be approached on from left.  7. Hypokalemia: repleted and remain low. Mag level 1.9. Will replete again and recheck. No frequent stools or emesis. Likely related to decreased po intake. 8. Hyperlipidemia, started on statin     Code Status: full Family Communication: Discussed with multiple family members at the bedside Disposition Plan: likely ready in 24 hours for discharge to SNF if bed available   Consultants:  Neurology  neurosurgery  Procedures:  none  Antibiotics:  none  HPI/Subjective: Awake alert but slow to respond  Objective: Filed Vitals:   08/19/12 1940 08/19/12 2055 08/19/12 2125 08/20/12 0614  BP: 181/69  155/68 134/71  Pulse: 68  65 67  Temp: 97.9 F (36.6 C)  99.7 F (37.6 C) 97.8 F (36.6 C)  TempSrc: Oral  Oral Oral  Resp: 20  20 20   Height:  5\' 3"  (1.6 m)    Weight:  67 kg (147 lb 11.3 oz)    SpO2:   99% 96%    Intake/Output Summary (Last 24 hours) at 08/20/12 1056 Last data filed at 08/19/12 1730  Gross per 24 hour  Intake    550 ml  Output      0 ml  Net    550 ml   Filed Weights   08/16/12 1743 08/18/12 0523 08/19/12 2055  Weight: 62.914 kg (138 lb 11.2 oz) 61 kg (134 lb 7.7 oz) 67 kg (147 lb 11.3 oz)    Exam:   General:  Awake alert thin   Cardiovascular: RRR No MGR No LEE  Respiratory: normal effort somewhat shallow BS slightly diminished No wheeze/rhonci  Abdomen:  soft +BS non-tender to palpation  Data Reviewed: Basic Metabolic Panel:  Recent Labs Lab 08/16/12 1231 08/18/12 0420 08/19/12 0547 08/20/12 0555  NA 143 140 141 138  K 3.5 2.9* 2.8* 2.9*  CL 109 108 108 108  CO2 24 22 22 21   GLUCOSE 101* 139* 105* 70  BUN 22 8 7 6   CREATININE 0.99 0.67 0.53 0.59  CALCIUM 10.1 8.7 9.1 9.1  MG  --   --  1.9  --    Liver Function Tests:  Recent Labs Lab 08/16/12 1231  AST 18  ALT 9  ALKPHOS 39  BILITOT 0.4  PROT 7.6  ALBUMIN 4.0   No results found for this basename:  LIPASE, AMYLASE,  in the last 168 hours No results found for this basename: AMMONIA,  in the last 168 hours CBC:  Recent Labs Lab 08/16/12 1231 08/18/12 0420 08/19/12 0547  WBC 6.2 5.9 5.5  NEUTROABS 4.0  --   --   HGB 11.2* 10.0* 10.9*  HCT 34.5* 30.8* 33.4*  MCV 88.2 88.8 87.7  PLT 184 152 157   Cardiac Enzymes:  Recent Labs Lab 08/16/12 1231  CKTOTAL 123  TROPONINI <0.30   BNP (last 3 results) No results found for this basename: PROBNP,  in the last 8760 hours CBG:  Recent Labs Lab 08/19/12 1656 08/19/12 2018 08/19/12 2357 08/20/12 0344 08/20/12 0729  GLUCAP 87 192* 131* 209* 88    No results found for this or any previous visit (from the past 240 hour(s)).   Studies: Ct Head Wo Contrast  08/19/2012  *RADIOLOGY REPORT*  Clinical Data: Follow up subdural fluid and recent infarctions.  CT HEAD WITHOUT CONTRAST  Technique:  Contiguous axial images were obtained from the base of the skull through the vertex without contrast.  Comparison: 08/18/2012  Findings: MRI showed a thin focus of extra-axial fluid along the posterior convexity on the right up to 4 mm in thickness.  This collection can be identified on today's examination and appears unchanged.  No additional extra-axial fluid collections. Further evolutionary changes are noted in the patient's documented posterior left posterior cerebral artery territory infarction including a large focal area of subacute infarction in the left thalamus.  These findings were called described on the preceding studies and are stable.  No new findings. These areas of infarction show no significant mass effect and no evidence of hemorrhage or other complicating features.  Moderate atrophy.  Small vessel white matter ischemic change.  Extensive intracranial atherosclerotic calcifications.  Bony calvarium remains intact.  IMPRESSION: Stable, small and the and subdural hematoma or fluid collection along the posterior convexity on the right.   Stable the left posterior cerebral artery territory infarction.  No new interval changes.   Original Report Authenticated By: Sander Radon, M.D.    Mr [facility] Wo Contrast  08/18/2012  *RADIOLOGY REPORT*  Clinical Data:  Stroke.  Gait disturbance.  Speech disturbance. Abnormal head CT.  MRI HEAD WITHOUT CONTRAST MRA HEAD WITHOUT CONTRAST  Technique:  Multiplanar, multiecho pulse sequences of the brain and surrounding structures were obtained without intravenous contrast. Angiographic images of the head were obtained using MRA technique without contrast.  Comparison:  Head CT 08/16/2012  MRI HEAD  Findings:  Diffusion imaging shows acute/subacute infarction in the left PCA territory affecting the posteromedial temporal lobe and occipital lobe on the left and the left thalamus.  The area of infarction shows mild swelling without evidence of acute hemorrhage or mass effect/shift.  There are extensive  old small vessel infarctions throughout the pons.  There are a few old small vessel infarctions affecting the cerebellum.  There is an old left thalamic infarction posterior to the acute infarction.  There are chronic small vessel ischemic changes throughout the cerebral hemispheric white matter.  There is a thin subdural hematoma or fluid collection along the posterior convexity on the right, maximal thickness 4 mm.  No significant mass effect.  No evidence of tumor/mass lesion, hydrocephalus or midline shift. No pituitary mass.  No inflammatory sinus disease.  IMPRESSION: Acute/subacute infarction in the left PCA territory affecting the posteromedial temporal lobe and occipital lobe on the left with involvement also of the left thalamus.  Old ischemic changes throughout the pons, cerebellum, left thalamus and hemispheric white matter.  Thin subdural hematoma along the posterior convexity on the right, maximal thickness 4 mm.  MRA HEAD  Findings: Both internal carotid arteries are patent into the brain. There is  atherosclerotic narrowing and both carotid siphon and supraclinoid ICA regions with maximal stenosis estimated at 50% on each side.  Beyond that, the anterior and middle cerebral vessels are patent but show widespread atherosclerotic irregularity with narrowing affecting multiple branch vessels.  Both vertebral arteries are patent to the basilar.  The distal vertebral arteries show narrowing and irregularity.  Maximal stenosis of the left vertebral artery is estimated at 50%.  The basilar artery shows moderate atherosclerotic irregularity but no stenosis.  There is no flow demonstrated in the left PCA, consistent with the region of acute infarction.  There is flow in the right PCA, but the vessel is severely diseased with severe stenosis and limited flow.  IMPRESSION: Severe widespread intracranial atherosclerotic disease with multiple branch vessel narrowings throughout.  Nonvisualization of the left posterior cerebral artery, consistent with acute infarction in that vascular territory.   Original Report Authenticated By: Paulina Fusi, M.D.    Mr Brain Wo Contrast  08/18/2012  *RADIOLOGY REPORT*  Clinical Data:  Stroke.  Gait disturbance.  Speech disturbance. Abnormal head CT.  MRI HEAD WITHOUT CONTRAST MRA HEAD WITHOUT CONTRAST  Technique:  Multiplanar, multiecho pulse sequences of the brain and surrounding structures were obtained without intravenous contrast. Angiographic images of the head were obtained using MRA technique without contrast.  Comparison:  Head CT 08/16/2012  MRI HEAD  Findings:  Diffusion imaging shows acute/subacute infarction in the left PCA territory affecting the posteromedial temporal lobe and occipital lobe on the left and the left thalamus.  The area of infarction shows mild swelling without evidence of acute hemorrhage or mass effect/shift.  There are extensive old small vessel infarctions throughout the pons.  There are a few old small vessel infarctions affecting the cerebellum.   There is an old left thalamic infarction posterior to the acute infarction.  There are chronic small vessel ischemic changes throughout the cerebral hemispheric white matter.  There is a thin subdural hematoma or fluid collection along the posterior convexity on the right, maximal thickness 4 mm.  No significant mass effect.  No evidence of tumor/mass lesion, hydrocephalus or midline shift. No pituitary mass.  No inflammatory sinus disease.  IMPRESSION: Acute/subacute infarction in the left PCA territory affecting the posteromedial temporal lobe and occipital lobe on the left with involvement also of the left thalamus.  Old ischemic changes throughout the pons, cerebellum, left thalamus and hemispheric white matter.  Thin subdural hematoma along the posterior convexity on the right, maximal thickness 4 mm.  MRA HEAD  Findings: Both internal carotid arteries are patent  into the brain. There is atherosclerotic narrowing and both carotid siphon and supraclinoid ICA regions with maximal stenosis estimated at 50% on each side.  Beyond that, the anterior and middle cerebral vessels are patent but show widespread atherosclerotic irregularity with narrowing affecting multiple branch vessels.  Both vertebral arteries are patent to the basilar.  The distal vertebral arteries show narrowing and irregularity.  Maximal stenosis of the left vertebral artery is estimated at 50%.  The basilar artery shows moderate atherosclerotic irregularity but no stenosis.  There is no flow demonstrated in the left PCA, consistent with the region of acute infarction.  There is flow in the right PCA, but the vessel is severely diseased with severe stenosis and limited flow.  IMPRESSION: Severe widespread intracranial atherosclerotic disease with multiple branch vessel narrowings throughout.  Nonvisualization of the left posterior cerebral artery, consistent with acute infarction in that vascular territory.   Original Report Authenticated By: Paulina Fusi, M.D.    US Carotid Duplex Bilateral  08/19/2012  *RADIOLOGY REPORT*  Clinical Data: Stroke  BILATERAL CAROTID DUPLEX ULTRASOUND  Technique: Gray scale imaging, color Doppler and duplex ultrasound was performed of bilateral carotid and vertebral arteries in the neck.  Comparison:  CT head 08/19/2012; MRI of the brain 08/18/2012  Criteria:  Quantification of carotid stenosis is based on velocity parameters that correlate the residual internal carotid diameter with NASCET-based stenosis levels, using the diameter of the distal internal carotid lumen as the denominator for stenosis measurement.  The following velocity measurements were obtained:                   PEAK SYSTOLIC/END DIASTOLIC RIGHT ICA:                        97/8cm/sec CCA:                        83/10cm/sec SYSTOLIC ICA/CCA RATIO:     1.2 DIASTOLIC ICA/CCA RATIO:    0.8 ECA:                        91cm/sec  LEFT ICA:                        84/16cm/sec CCA:                        84/15cm/sec SYSTOLIC ICA/CCA RATIO:     1.0 DIASTOLIC ICA/CCA RATIO:    1.0 ECA:                        106cm/sec  Findings:  RIGHT CAROTID ARTERY: Intimal medial thickening throughout the common carotid artery.  There is noncalcified mild atherosclerotic plaque in the bifurcation extending into the proximal internal carotid artery.  No evidence of significant stenosis by gray scale, color Doppler or spectral waveform analysis.  RIGHT VERTEBRAL ARTERY:  Patent with antegrade flow.  LEFT CAROTID ARTERY: Mild intimal medial thickening in the mid common carotid artery.  Heterogeneous, irregular and possibly ulcerated fibrofatty atherosclerotic plaque noted in the distal common carotid artery.  Atherosclerotic plaque extends into the bulb and proximal internal carotid artery.  No significant stenosis by gray scale, color Doppler or spectral waveform analysis.  LEFT VERTEBRAL ARTERY:  Patent with normal antegrade flow.  IMPRESSION:  1.  Mild atherosclerotic plaque in the  bilateral proximal internal carotid  arteries results in a less than 50% estimated diameter stenosis bilaterally. 2.  Irregular, and possibly ulcerated fibrofatty plaque in the distal left common carotid artery. 3.  Bilateral vertebral arteries are patent with normal antegrade flow.   Original Report Authenticated By: Malachy Moan, M.D.     Scheduled Meds: . FLUoxetine  20 mg Oral Daily  . insulin aspart  0-9 Units Subcutaneous Q4H  . potassium chloride  40 mEq Oral Once  . ramipril  5 mg Oral Daily  . simvastatin  20 mg Oral q1800   Continuous Infusions: . sodium chloride Stopped (08/19/12 1715)    Principal Problem:   CVA (cerebral infarction) Active Problems:   Type II or unspecified type diabetes mellitus with neurological manifestations, not stated as uncontrolled(250.60)   HTN (hypertension), malignant   Altered mental status   Subdural hematoma    Time spent: 30 minutes    [facility] M  [facility]  If 8PM-8AM, please contact night-coverage at [email or web address], password [facility] 08/20/2012, 10:56 AM  LOS: 4 days    Attending note:  Patient seen and examined. Above note reviewed.  Patient is more awake and interactive today. She has multiple family members at the bedside with her encouraging her/assist her in eating.  Her by mouth intake has been poor. She has been started on Prozac for any element of post stroke depression. Her family is hopeful that she will regain her appetite. She's been cleared by speech pathology for a pured diet. It is unclear whether she will be able to regain appetite where she can consume an adequate number of calories. We briefly discussed the option of artificial nutrition via gastrostomy tube. I have not recommended this procedure in her case. Family is agreeable to continue nutrition by mouth and encourage her to eat as much as possible. If the patient does not regain her desire to eat and drink, she will fail to thrive and would be  appropriate for hospice. It does not appear that her family is ready for this step at this time. She'll be transferred to a skilled nursing facility for continued care. It would be reasonable to monitor her by mouth intake at the facility over the next few weeks. If she does not recover and continues to become weak and fails to thrive, would strongly recommend a palliative care/hospice approach.  She'll need a repeat CT scan of her head in 4 weeks to monitor the subdural hematoma. This is resolved then she may resume on antiplatelet agents per neurology. She will need followup with neurology in 4 weeks.  Tentatively, she will possibly be ready for discharge tomorrow.

## 2012-08-20 NOTE — Progress Notes (Signed)
Patient ID: [name], female   DOB: 02/05/1924, 79 y.o.   MRN: [id number]  [facility] NEUROLOGY Liyanna Cartwright A. Gerilyn Pilgrim, MD     [email or web address]          [name] is an 79 y.o. female.   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 Risk factors include hypertension diabetes and dyslipidemia. The patient is to continue with statin medication and risk factor reduction. She has a dense right homonymous hemianopia and therefore should be approach from the left side.  Thin right posterior subdural hematoma status post fall. The patient has been taken off antiplatelet agents. This does not mean that the patient cannot be on antiplatelet agents at all. I would recommend repeating the scan about 4 weeks from now. At that time if everything looks okay with the scan/resolution of the hemorrhage, the patient can be restarted on antiplatelet agents.  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 I would recommend starting the patient on SSRI preferably Prozac.  Lack of motivation to eat. Likely related to poststroke depression. We appreciate input from swallowing/speech therapist.   The patient does not report any new complaints today. She is more responsive and more fluent in her speech. She still continues to have some confusion likely related to her aphasia. Again, she has a right homonymous hemianopia. The patient likely should be approached from the left side than on the right side. She also continues to have global weakness but more the right side. The right side is graded as 4 minus.  I did review the patient's head CT scan in person. She has hypodensity involving the left occipital area essentially approximated the PCA distribution. I really cannot appreciate the teeny hemorrhage/subdural hematoma involving the posterior convexity on the right side seen on MRI.       Objective: Vital signs in last 24 hours: Temp:  [97.8 F (36.6 C)-99.7 F (37.6 C)]  97.8 F (36.6 C) (02/19 0614) Pulse Rate:  [64-73] 67 (02/19 0614) Resp:  [18-20] 20 (02/19 0614) BP: (134-185)/(68-74) 134/71 mmHg (02/19 0614) SpO2:  [96 %-99 %] 96 % (02/19 0614) Weight:  [67 kg (147 lb 11.3 oz)] 67 kg (147 lb 11.3 oz) (02/18 2055)  Intake/Output from previous day: 02/18 0701 - 02/19 0700 In: 750 [I.V.:250; IV Piggyback:500] Out: -  Intake/Output this shift:   Nutritional status: Dysphagia   Lab Results: Results for orders placed during the hospital encounter of 08/16/12 (from the past 48 hour(s))  GLUCOSE, CAPILLARY     Status: Abnormal   Collection Time    08/18/12 11:54 AM      Result Value Range   Glucose-Capillary 122 (*) 70 - 99 mg/dL  GLUCOSE, CAPILLARY     Status: None   Collection Time    08/18/12  4:44 PM      Result Value Range   Glucose-Capillary 97  70 - 99 mg/dL  GLUCOSE, CAPILLARY     Status: None   Collection Time    08/18/12  8:52 PM      Result Value Range   Glucose-Capillary 83  70 - 99 mg/dL   Comment 1 Notify RN    GLUCOSE, CAPILLARY     Status: Abnormal   Collection Time    08/18/12 11:24 PM      Result Value Range   Glucose-Capillary 66 (*) 70 - 99 mg/dL   Comment 1 Notify RN    GLUCOSE, CAPILLARY     Status: Abnormal   Collection Time  08/18/12 11:47 PM      Result Value Range   Glucose-Capillary >600 (*) 70 - 99 mg/dL   Comment 1 Notify RN    GLUCOSE, CAPILLARY     Status: Abnormal   Collection Time    08/18/12 11:52 PM      Result Value Range   Glucose-Capillary 161 (*) 70 - 99 mg/dL   Comment 1 Notify RN    GLUCOSE, CAPILLARY     Status: Abnormal   Collection Time    08/19/12  4:29 AM      Result Value Range   Glucose-Capillary 347 (*) 70 - 99 mg/dL   Comment 1 Notify RN    BASIC METABOLIC PANEL     Status: Abnormal   Collection Time    08/19/12  5:47 AM      Result Value Range   Sodium 141  135 - 145 mEq/L   Potassium 2.8 (*) 3.5 - 5.1 mEq/L   Chloride 108  96 - 112 mEq/L   CO2 22  19 - 32 mEq/L    Glucose, Bld 105 (*) 70 - 99 mg/dL   BUN 7  6 - 23 mg/dL   Creatinine, Ser 4.09  0.50 - 1.10 mg/dL   Calcium 9.1  8.4 - 81.1 mg/dL   GFR calc non Af Amer 84 (*) >90 mL/min   GFR calc Af Amer >90  >90 mL/min   Comment:            The eGFR has been calculated     using the CKD EPI equation.     This calculation has not been     validated in all clinical     situations.     eGFR's persistently     <90 mL/min signify     possible Chronic Kidney Disease.  CBC     Status: Abnormal   Collection Time    08/19/12  5:47 AM      Result Value Range   WBC 5.5  4.0 - 10.5 K/uL   RBC 3.81 (*) 3.87 - 5.11 MIL/uL   Hemoglobin 10.9 (*) 12.0 - 15.0 g/dL   HCT 91.4 (*) 78.2 - 95.6 %   MCV 87.7  78.0 - 100.0 fL   MCH 28.6  26.0 - 34.0 pg   MCHC 32.6  30.0 - 36.0 g/dL   RDW 21.3  08.6 - 57.8 %   Platelets 157  150 - 400 K/uL  MAGNESIUM     Status: None   Collection Time    08/19/12  5:47 AM      Result Value Range   Magnesium 1.9  1.5 - 2.5 mg/dL  GLUCOSE, CAPILLARY     Status: Abnormal   Collection Time    08/19/12  7:39 AM      Result Value Range   Glucose-Capillary 344 (*) 70 - 99 mg/dL   Comment 1 Notify RN    GLUCOSE, CAPILLARY     Status: Abnormal   Collection Time    08/19/12  8:17 AM      Result Value Range   Glucose-Capillary 50 (*) 70 - 99 mg/dL  RPR     Status: None   Collection Time    08/19/12  9:13 AM      Result Value Range   RPR NON REACTIVE  NON REACTIVE  HOMOCYSTEINE     Status: None   Collection Time    08/19/12  9:13 AM      Result Value  Range   Homocysteine 9.3  4.0 - 15.4 umol/L  VITAMIN B12     Status: Abnormal   Collection Time    08/19/12  9:13 AM      Result Value Range   Vitamin B-12 1150 (*) 211 - 911 pg/mL  TSH     Status: None   Collection Time    08/19/12  9:13 AM      Result Value Range   TSH 2.456  0.350 - 4.500 uIU/mL  GLUCOSE, CAPILLARY     Status: Abnormal   Collection Time    08/19/12  9:50 AM      Result Value Range    Glucose-Capillary 214 (*) 70 - 99 mg/dL   Comment 1 Notify RN    GLUCOSE, CAPILLARY     Status: Abnormal   Collection Time    08/19/12 11:56 AM      Result Value Range   Glucose-Capillary 124 (*) 70 - 99 mg/dL  GLUCOSE, CAPILLARY     Status: None   Collection Time    08/19/12 12:57 PM      Result Value Range   Glucose-Capillary 81  70 - 99 mg/dL  GLUCOSE, CAPILLARY     Status: Abnormal   Collection Time    08/19/12  4:53 PM      Result Value Range   Glucose-Capillary 420 (*) 70 - 99 mg/dL   Comment 1 Notify RN     Comment 2 Documented in Chart    GLUCOSE, CAPILLARY     Status: None   Collection Time    08/19/12  4:56 PM      Result Value Range   Glucose-Capillary 87  70 - 99 mg/dL   Comment 1 Notify RN     Comment 2 Documented in Chart    GLUCOSE, CAPILLARY     Status: Abnormal   Collection Time    08/19/12  8:18 PM      Result Value Range   Glucose-Capillary 192 (*) 70 - 99 mg/dL   Comment 1 Notify RN     Comment 2 Documented in Chart    GLUCOSE, CAPILLARY     Status: Abnormal   Collection Time    08/19/12 11:57 PM      Result Value Range   Glucose-Capillary 131 (*) 70 - 99 mg/dL  GLUCOSE, CAPILLARY     Status: Abnormal   Collection Time    08/20/12  3:44 AM      Result Value Range   Glucose-Capillary 209 (*) 70 - 99 mg/dL  BASIC METABOLIC PANEL     Status: Abnormal   Collection Time    08/20/12  5:55 AM      Result Value Range   Sodium 138  135 - 145 mEq/L   Potassium 2.9 (*) 3.5 - 5.1 mEq/L   Chloride 108  96 - 112 mEq/L   CO2 21  19 - 32 mEq/L   Glucose, Bld 70  70 - 99 mg/dL   BUN 6  6 - 23 mg/dL   Creatinine, Ser 1.61  0.50 - 1.10 mg/dL   Calcium 9.1  8.4 - 09.6 mg/dL   GFR calc non Af Amer 81 (*) >90 mL/min   GFR calc Af Amer >90  >90 mL/min   Comment:            The eGFR has been calculated     using the CKD EPI equation.     This calculation has not been  validated in all clinical     situations.     eGFR's persistently     <90 mL/min  signify     possible Chronic Kidney Disease.  GLUCOSE, CAPILLARY     Status: None   Collection Time    08/20/12  7:29 AM      Result Value Range   Glucose-Capillary 88  70 - 99 mg/dL    Lipid Panel No results found for this basename: CHOL, TRIG, HDL, CHOLHDL, VLDL, LDLCALC,  in the last 72 hours  Studies/Results: Ct Head Wo Contrast  08/19/2012  *RADIOLOGY REPORT*  Clinical Data: Follow up subdural fluid and recent infarctions.  CT HEAD WITHOUT CONTRAST  Technique:  Contiguous axial images were obtained from the base of the skull through the vertex without contrast.  Comparison: 08/18/2012  Findings: MRI showed a thin focus of extra-axial fluid along the posterior convexity on the right up to 4 mm in thickness.  This collection can be identified on today's examination and appears unchanged.  No additional extra-axial fluid collections. Further evolutionary changes are noted in the patient's documented posterior left posterior cerebral artery territory infarction including a large focal area of subacute infarction in the left thalamus.  These findings were called described on the preceding studies and are stable.  No new findings. These areas of infarction show no significant mass effect and no evidence of hemorrhage or other complicating features.  Moderate atrophy.  Small vessel white matter ischemic change.  Extensive intracranial atherosclerotic calcifications.  Bony calvarium remains intact.  IMPRESSION: Stable, small and the and subdural hematoma or fluid collection along the posterior convexity on the right.  Stable the left posterior cerebral artery territory infarction.  No new interval changes.   Original Report Authenticated By: Sander Radon, M.D.    Mr [facility] Wo Contrast  08/18/2012  *RADIOLOGY REPORT*  Clinical Data:  Stroke.  Gait disturbance.  Speech disturbance. Abnormal head CT.  MRI HEAD WITHOUT CONTRAST MRA HEAD WITHOUT CONTRAST  Technique:  Multiplanar, multiecho pulse  sequences of the brain and surrounding structures were obtained without intravenous contrast. Angiographic images of the head were obtained using MRA technique without contrast.  Comparison:  Head CT 08/16/2012  MRI HEAD  Findings:  Diffusion imaging shows acute/subacute infarction in the left PCA territory affecting the posteromedial temporal lobe and occipital lobe on the left and the left thalamus.  The area of infarction shows mild swelling without evidence of acute hemorrhage or mass effect/shift.  There are extensive old small vessel infarctions throughout the pons.  There are a few old small vessel infarctions affecting the cerebellum.  There is an old left thalamic infarction posterior to the acute infarction.  There are chronic small vessel ischemic changes throughout the cerebral hemispheric white matter.  There is a thin subdural hematoma or fluid collection along the posterior convexity on the right, maximal thickness 4 mm.  No significant mass effect.  No evidence of tumor/mass lesion, hydrocephalus or midline shift. No pituitary mass.  No inflammatory sinus disease.  IMPRESSION: Acute/subacute infarction in the left PCA territory affecting the posteromedial temporal lobe and occipital lobe on the left with involvement also of the left thalamus.  Old ischemic changes throughout the pons, cerebellum, left thalamus and hemispheric white matter.  Thin subdural hematoma along the posterior convexity on the right, maximal thickness 4 mm.  MRA HEAD  Findings: Both internal carotid arteries are patent into the brain. There is atherosclerotic narrowing and both carotid siphon and supraclinoid ICA  regions with maximal stenosis estimated at 50% on each side.  Beyond that, the anterior and middle cerebral vessels are patent but show widespread atherosclerotic irregularity with narrowing affecting multiple branch vessels.  Both vertebral arteries are patent to the basilar.  The distal vertebral arteries show  narrowing and irregularity.  Maximal stenosis of the left vertebral artery is estimated at 50%.  The basilar artery shows moderate atherosclerotic irregularity but no stenosis.  There is no flow demonstrated in the left PCA, consistent with the region of acute infarction.  There is flow in the right PCA, but the vessel is severely diseased with severe stenosis and limited flow.  IMPRESSION: Severe widespread intracranial atherosclerotic disease with multiple branch vessel narrowings throughout.  Nonvisualization of the left posterior cerebral artery, consistent with acute infarction in that vascular territory.   Original Report Authenticated By: Paulina Fusi, M.D.    Mr Brain Wo Contrast  08/18/2012  *RADIOLOGY REPORT*  Clinical Data:  Stroke.  Gait disturbance.  Speech disturbance. Abnormal head CT.  MRI HEAD WITHOUT CONTRAST MRA HEAD WITHOUT CONTRAST  Technique:  Multiplanar, multiecho pulse sequences of the brain and surrounding structures were obtained without intravenous contrast. Angiographic images of the head were obtained using MRA technique without contrast.  Comparison:  Head CT 08/16/2012  MRI HEAD  Findings:  Diffusion imaging shows acute/subacute infarction in the left PCA territory affecting the posteromedial temporal lobe and occipital lobe on the left and the left thalamus.  The area of infarction shows mild swelling without evidence of acute hemorrhage or mass effect/shift.  There are extensive old small vessel infarctions throughout the pons.  There are a few old small vessel infarctions affecting the cerebellum.  There is an old left thalamic infarction posterior to the acute infarction.  There are chronic small vessel ischemic changes throughout the cerebral hemispheric white matter.  There is a thin subdural hematoma or fluid collection along the posterior convexity on the right, maximal thickness 4 mm.  No significant mass effect.  No evidence of tumor/mass lesion, hydrocephalus or midline  shift. No pituitary mass.  No inflammatory sinus disease.  IMPRESSION: Acute/subacute infarction in the left PCA territory affecting the posteromedial temporal lobe and occipital lobe on the left with involvement also of the left thalamus.  Old ischemic changes throughout the pons, cerebellum, left thalamus and hemispheric white matter.  Thin subdural hematoma along the posterior convexity on the right, maximal thickness 4 mm.  MRA HEAD  Findings: Both internal carotid arteries are patent into the brain. There is atherosclerotic narrowing and both carotid siphon and supraclinoid ICA regions with maximal stenosis estimated at 50% on each side.  Beyond that, the anterior and middle cerebral vessels are patent but show widespread atherosclerotic irregularity with narrowing affecting multiple branch vessels.  Both vertebral arteries are patent to the basilar.  The distal vertebral arteries show narrowing and irregularity.  Maximal stenosis of the left vertebral artery is estimated at 50%.  The basilar artery shows moderate atherosclerotic irregularity but no stenosis.  There is no flow demonstrated in the left PCA, consistent with the region of acute infarction.  There is flow in the right PCA, but the vessel is severely diseased with severe stenosis and limited flow.  IMPRESSION: Severe widespread intracranial atherosclerotic disease with multiple branch vessel narrowings throughout.  Nonvisualization of the left posterior cerebral artery, consistent with acute infarction in that vascular territory.   Original Report Authenticated By: Paulina Fusi, M.D.    US Carotid Duplex Bilateral  08/19/2012  *  RADIOLOGY REPORT*  Clinical Data: Stroke  BILATERAL CAROTID DUPLEX ULTRASOUND  Technique: Wallace Cullens scale imaging, color Doppler and duplex ultrasound was performed of bilateral carotid and vertebral arteries in the neck.  Comparison:  CT head 08/19/2012; MRI of the brain 08/18/2012  Criteria:  Quantification of carotid stenosis  is based on velocity parameters that correlate the residual internal carotid diameter with NASCET-based stenosis levels, using the diameter of the distal internal carotid lumen as the denominator for stenosis measurement.  The following velocity measurements were obtained:                   PEAK SYSTOLIC/END DIASTOLIC RIGHT ICA:                        97/8cm/sec CCA:                        83/10cm/sec SYSTOLIC ICA/CCA RATIO:     1.2 DIASTOLIC ICA/CCA RATIO:    0.8 ECA:                        91cm/sec  LEFT ICA:                        84/16cm/sec CCA:                        84/15cm/sec SYSTOLIC ICA/CCA RATIO:     1.0 DIASTOLIC ICA/CCA RATIO:    1.0 ECA:                        106cm/sec  Findings:  RIGHT CAROTID ARTERY: Intimal medial thickening throughout the common carotid artery.  There is noncalcified mild atherosclerotic plaque in the bifurcation extending into the proximal internal carotid artery.  No evidence of significant stenosis by gray scale, color Doppler or spectral waveform analysis.  RIGHT VERTEBRAL ARTERY:  Patent with antegrade flow.  LEFT CAROTID ARTERY: Mild intimal medial thickening in the mid common carotid artery.  Heterogeneous, irregular and possibly ulcerated fibrofatty atherosclerotic plaque noted in the distal common carotid artery.  Atherosclerotic plaque extends into the bulb and proximal internal carotid artery.  No significant stenosis by gray scale, color Doppler or spectral waveform analysis.  LEFT VERTEBRAL ARTERY:  Patent with normal antegrade flow.  IMPRESSION:  1.  Mild atherosclerotic plaque in the bilateral proximal internal carotid arteries results in a less than 50% estimated diameter stenosis bilaterally. 2.  Irregular, and possibly ulcerated fibrofatty plaque in the distal left common carotid artery. 3.  Bilateral vertebral arteries are patent with normal antegrade flow.   Original Report Authenticated By: Malachy Moan, M.D.     Medications:  Scheduled Meds: .  FLUoxetine  20 mg Oral Daily  . insulin aspart  0-9 Units Subcutaneous Q4H  . potassium chloride  40 mEq Oral Once  . ramipril  5 mg Oral Daily  . simvastatin  20 mg Oral q1800   Continuous Infusions: . sodium chloride Stopped (08/19/12 1715)   PRN Meds:.sodium chloride, dextrose, hydrALAZINE, ketorolac, phenol     LOS: 4 days   Chalisa Kobler A. Gerilyn Pilgrim, M.D.  Diplomate, Biomedical engineer of Psychiatry and Neurology ( Neurology).

## 2012-08-20 NOTE — Progress Notes (Signed)
Physical Therapy Treatment Patient Details Name: [name] MRN: [id number] DOB: 02/05/1924 Today's Date: 08/20/2012 Time: 1478-2956 PT Time Calculation (min): 24 min   PT Assessment / Plan / Recommendation Comments on Treatment Session  Pt initially refused treatment but able to be convinced by family members.  Pt. requires vc's and minimal assistance with all transfers.  Pt. able to complete but with increased time.  Pt. encouraged to sit in chair but refused wanting to return to bed.                           Precautions / Restrictions Precautions Precautions: Fall        Mobility  Bed Mobility Bed Mobility: Sit to Supine;Sitting - Scoot to Edge of Bed;Supine to Sit Supine to Sit: 4: Min assist Sitting - Scoot to Edge of Bed: 4: Min assist Sit to Supine: 4: Min assist Transfers Transfers: Sit to Stand;Stand to Sit Sit to Stand: 4: Min assist Stand to Sit: 4: Min assist Ambulation/Gait Ambulation/Gait Assistance: 3: Mod assist Ambulation Distance (Feet): 10 Feet Assistive device: 1 person hand held assist Ambulation/Gait Assistance Details: Pt able to ambulate to chair, rest and return back to bed. Gait Pattern: Trunk flexed;Narrow base of support Gait velocity: slow    PT Goals    Visit Information  Last PT Received On: 08/20/12    Subjective Data  Subjective: Pt. refused initially, however family present and able to convince to participate.  Pt. has no complaints of pain.   Cognition  Cognition Arousal/Alertness: Awake/alert Orientation Level: Appears intact for tasks assessed Behavior During Session: [place] for tasks performed       End of Session PT - End of Session Equipment Utilized During Treatment: Gait belt Activity Tolerance: Patient tolerated treatment well Patient left: in bed;with call bell/phone within reach;with bed alarm set;with nursing in room;with family/visitor present     Lurena Nida, PTA/CLT 08/20/2012, 4:46 PM

## 2012-08-20 NOTE — Clinical Social Work Note (Signed)
Patient received bed offers from [facility], [facility], [facility], [facility] and [facility].  CSW left VM requesting niece to return call to discuss bed offers and make choice of SNF for rehab.  CSW attempted to speak w patient, but patient was unable to engage in conversation.  Per record, appears that patient's niece and sister are assisting w decision making process and patient is deferring to them.  [place], LCSW Clinical Social Worker [phone])

## 2012-08-21 ENCOUNTER — Encounter (HOSPITAL_COMMUNITY): Payer: Self-pay | Admitting: Internal Medicine

## 2012-08-21 ENCOUNTER — Inpatient Hospital Stay
Admission: RE | Admit: 2012-08-21 | Discharge: 2012-10-05 | Disposition: A | Discharge status: Nursing Home (Includes ICF) | Payer: Medicare PPO | Source: Ambulatory Visit | Attending: Internal Medicine | Admitting: Internal Medicine

## 2012-08-21 DIAGNOSIS — R1314 Dysphagia, pharyngoesophageal phase: Secondary | ICD-10-CM | POA: Diagnosis present

## 2012-08-21 DIAGNOSIS — S065XAA Traumatic subdural hemorrhage with loss of consciousness status unknown, initial encounter: Principal | ICD-10-CM

## 2012-08-21 DIAGNOSIS — H53469 Homonymous bilateral field defects, unspecified side: Secondary | ICD-10-CM | POA: Diagnosis present

## 2012-08-21 LAB — CBC
Hemoglobin: 11.3 g/dL — ABNORMAL LOW (ref 12.0–15.0)
MCH: 29.1 pg (ref 26.0–34.0)
Platelets: 192 10*3/uL (ref 150–400)
RBC: 3.88 MIL/uL (ref 3.87–5.11)
WBC: 5.6 10*3/uL (ref 4.0–10.5)

## 2012-08-21 LAB — GLUCOSE, CAPILLARY
Glucose-Capillary: 122 mg/dL — ABNORMAL HIGH (ref 70–99)
Glucose-Capillary: 98 mg/dL (ref 70–99)
Glucose-Capillary: 85 mg/dL (ref 70–99)

## 2012-08-21 LAB — BASIC METABOLIC PANEL
CO2: 21 mEq/L (ref 19–32)
Chloride: 105 mEq/L (ref 96–112)
Glucose, Bld: 92 mg/dL (ref 70–99)
Sodium: 138 mEq/L (ref 135–145)

## 2012-08-21 MED ORDER — PHENOL 1.4 % MT LIQD: 1.0000 | OROMUCOSAL | Status: DC | PRN

## 2012-08-21 MED ORDER — POTASSIUM CHLORIDE 20 MEQ/15ML (10%) PO LIQD: 20.0000 meq | Freq: Two times a day (BID) | ORAL | Status: AC

## 2012-08-21 MED ORDER — POTASSIUM CHLORIDE 20 MEQ/15ML (10%) PO LIQD: 20.0000 meq | Freq: Two times a day (BID) | ORAL | Status: DC

## 2012-08-21 MED ORDER — FLUOXETINE HCL 20 MG PO CAPS: 20.0000 mg | ORAL_CAPSULE | Freq: Every day | ORAL | Status: DC

## 2012-08-21 MED ORDER — RAMIPRIL 5 MG PO CAPS: 5.0000 mg | ORAL_CAPSULE | Freq: Every day | ORAL | Status: DC

## 2012-08-21 MED ORDER — POTASSIUM CHLORIDE 20 MEQ/15ML (10%) PO LIQD: 40.0000 meq | ORAL | Status: DC

## 2012-08-21 MED ORDER — SIMVASTATIN 20 MG PO TABS: 20.0000 mg | ORAL_TABLET | Freq: Every day | ORAL | Status: DC

## 2012-08-21 MED ORDER — POTASSIUM CHLORIDE 20 MEQ/15ML (10%) PO LIQD
40.0000 meq | Freq: Once | ORAL | Status: AC
  Administered 2012-08-21: 40 meq via ORAL
  Filled 2012-08-21: qty 30

## 2012-08-21 NOTE — Progress Notes (Signed)
Pt discharged to [facility] today per Dr. Sherrie Mustache. Pt's VS stable at this time. Pt's niece provided with stroke education handout. Verbalized understanding. Report called to nurse at [facility]. Verbalized understanding. Pt left floor via WC in stable condition accompanied by NT.

## 2012-08-21 NOTE — Progress Notes (Signed)
Patient refused fingerstick, became combative, daughter at bedside.

## 2012-08-21 NOTE — Discharge Summary (Addendum)
Physician Discharge Summary  [name] WUJ:[id number] DOB: 02/05/1924 DOA: 08/16/2012  PCP: Colette Ribas, MD  Admit date: 08/16/2012 Discharge date: 08/21/2012  Time spent: 40 minutes  Recommendations for Outpatient Follow-up:  1. Pt being discharged to SNF 2. Daily PT 3. Potassium chloride po BID x 7 days. Recommend BMET 08/22/12 to trend potassium level 4. Repeat CT head 4 weeks to eval subdural hematoma 5. Confer with Dr. Gerilyn Pilgrim regarding results of CT and resumption of antiplatelet agents 6. Approach pt from left side 7. Encourage po intake  Discharge Diagnoses:  Principal Problem:   CVA (cerebral infarction) Active Problems:   Type II or unspecified type diabetes mellitus with neurological manifestations, not stated as uncontrolled(250.60)   HTN (hypertension), malignant   UTI (lower urinary tract infection)   Hypokalemia   Altered mental status   Subdural hematoma   Discharge Condition: stable and ready for discharge to nursing facility  Diet recommendation: dysphagia 1 carb modified thin liquid  Filed Weights   08/16/12 1743 08/18/12 0523 08/19/12 2055  Weight: 62.914 kg (138 lb 11.2 oz) 61 kg (134 lb 7.7 oz) 67 kg (147 lb 11.3 oz)    History of present illness:  [name] is a 79 y.o. female with past medical history of hypertension, diabetes with peripheral neuropathy and previous strokes on Aggrenox who on 08/15/12 family noted that she looked to be a little weaker and more tired. When they tended to her on 08/16/12 they noted that she looked to be quite confused and not moving very much. She did not seem to favor one side or the other.  Of one of her daughters reported that she would not really respond properly to questions. She became quite concerned and brought patient into the emergency room. CT scan head noted a subacute CVA in the left occipitoparietal region. Vital signs are otherwise unremarkable. Patient brought in for stroke workup  and evaluation   Hospital Course:  1. Acute left PCA CVA with homonymous hemianopia. Patient found to have an acute/subacute left PCA territory infarction affecting the posteromedial temporal lobe and occipital lobe with involvement of left thalamus. She was found to have an elevated LDL and so was started on a statin. Carotid arteries do not show any significant stenosis b/l. Fair control of BP. Seen by neurology. She was felt not to be a tpa candidate due to delay in patient arrival. Has demonstrated little interest in eating.  Neuro opines some post stroke depression playing a role. Prozac started 08/19/12. At time of discharge po intake improved somewhat with continuous encouragement.  2. Subdural hematoma s/p fall. Patient was examined and did not appear to have any other superficial injuries. MRI of the brain was conducted which confirmed stroke, but also indicated a thin subdural hematoma along the posterior convexity on the right. Case was discussed and MRI reviewed with Dr. Newell Coral, on call for neurosurgery. It was felt that this subdural hematoma was minimal. Repeat CT result stable. Neuro exam remained stable with some right sided weakness as result of #1. Neuro opines that pt will need to be on antiplatelet agent again. Recommending repeat CT in 4 weeks. If ok and resolution of hemorrhage, pt can be restarted on antiplatelet agent.  3. Hypertension. Blood pressure with better control. SBP range 125-180. Continue altace. 4. Dysphagia. Seen by speech therapy. Recommended puree diet. Managing secretions without problem. Has taken ice chips without problem. Pt demonstrating more of an unwillingness to swallow than inability. On 08/20/12  complained of sore throat. Improved with oral spray. At discharge pt po intake improved with encouragement and favorite foods. Taking po med without problem  5. Diabetes Mellitus. Blood sugars fair control. I episode hypoglycemia likely related to decreased po intake.  Given D5w. Actos discontinued. HgA1c 6.7. At discharge CBG range 85-120. Given variability of po intake, will not resume Actos. Will need daily CBG's for trending and optimal glycemic control.  6. Altered mental status. Initially pt slow to respond and lethargic.  Discussed with neurology and felt to be due to stroke. At discharge pt remains weak but is clear and able to make needs known.  Per neuro pt has a dense right homonymous hemianopia and should be approached on from left.  7. Hypokalemia: repleted and remain low. Mag level 1.9. Likely related to decreased po intake. At discharge trending up. Will discharge on maintenance dose BID for 7 days. Will need BMET on 08/28/12 to trend potassium level.  8. Hyperlipidemia, started on statin      Procedures:  none  Consultations:  Neurosurgery  neurology  Discharge Exam: Filed Vitals:   08/19/12 2055 08/19/12 2125 08/20/12 0614 08/21/12 0730  BP:  155/68 134/71 146/52  Pulse:  65 67 66  Temp:  99.7 F (37.6 C) 97.8 F (36.6 C) 98.4 F (36.9 C)  TempSrc:  Oral Oral Axillary  Resp:  20 20 16   Height: 5\' 3"  (1.6 m)     Weight: 67 kg (147 lb 11.3 oz)     SpO2:  99% 96% 100%    General: awake, refusing to open eyes. Somewhat resistant to exam. (Attending exam: The patient is sitting up in a chair with her eyes closed. She opens her eyes to voice. She takes a bite of mashed potatoes upon request. No signs of overt dysphagia with the mashed potatoes. However, she refuses to eat any more. Cardiovascular: RRR No MGR No LEE Respiratory: normal effort somewhat shallow. BSCTAB No wheeze/rhochi Neuro: Right homonymous hemianopia.  Discharge Instructions     Medication List    STOP taking these medications       amitriptyline 25 MG tablet  Commonly known as:  ELAVIL     clidinium-chlordiazePOXIDE 2.5-5 MG per capsule  Commonly known as:  LIBRAX     dipyridamole-aspirin 200-25 MG per 12 hr capsule  Commonly known as:  AGGRENOX      pioglitazone 30 MG tablet  Commonly known as:  ACTOS     pregabalin 50 MG capsule  Commonly known as:  LYRICA      TAKE these medications       FLUoxetine 20 MG capsule  Commonly known as:  PROZAC  Take 1 capsule (20 mg total) by mouth daily.     multivitamin tablet  Take 1 tablet by mouth daily.     pantoprazole 40 MG tablet  Commonly known as:  PROTONIX  Take 1 tablet (40 mg total) by mouth daily at 12 noon. FOR TREATMENT OF IRRITATION OF YOUR STOMACH LINING.     phenol 1.4 % Liqd  Commonly known as:  CHLORASEPTIC  Use as directed 1 spray in the mouth or throat as needed.     potassium chloride 20 MEQ/15ML (10%) solution  Take 15 mLs (20 mEq total) by mouth 2 (two) times daily.  Start taking on:  08/22/2012     ramipril 5 MG capsule  Commonly known as:  ALTACE  Take 1 capsule (5 mg total) by mouth daily.     simvastatin  20 MG tablet  Commonly known as:  ZOCOR  Take 1 tablet (20 mg total) by mouth daily at 6 PM.           Follow-up Information   Follow up with Colette Ribas, MD. Schedule an appointment as soon as possible for a visit in 1 week. (will need bmet 08/28/12 to evaluate potassium leve.)    Contact information:   [address] [place] [address] [phone]        The results of significant diagnostics from this hospitalization (including imaging, microbiology, ancillary and laboratory) are listed below for reference.    Significant Diagnostic Studies: Ct Head Wo Contrast  08/19/2012  *RADIOLOGY REPORT*  Clinical Data: Follow up subdural fluid and recent infarctions.  CT HEAD WITHOUT CONTRAST  Technique:  Contiguous axial images were obtained from the base of the skull through the vertex without contrast.  Comparison: 08/18/2012  Findings: MRI showed a thin focus of extra-axial fluid along the posterior convexity on the right up to 4 mm in thickness.  This collection can be identified on today's examination and  appears unchanged.  No additional extra-axial fluid collections. Further evolutionary changes are noted in the patient's documented posterior left posterior cerebral artery territory infarction including a large focal area of subacute infarction in the left thalamus.  These findings were called described on the preceding studies and are stable.  No new findings. These areas of infarction show no significant mass effect and no evidence of hemorrhage or other complicating features.  Moderate atrophy.  Small vessel white matter ischemic change.  Extensive intracranial atherosclerotic calcifications.  Bony calvarium remains intact.  IMPRESSION: Stable, small and the and subdural hematoma or fluid collection along the posterior convexity on the right.  Stable the left posterior cerebral artery territory infarction.  No new interval changes.   Original Report Authenticated By: Sander Radon, M.D.    Ct Head Wo Contrast  08/16/2012  *RADIOLOGY REPORT*  Clinical Data: Fall, right-sided weakness.  Altered mental status.  CT HEAD WITHOUT CONTRAST  Technique:  Contiguous axial images were obtained from the base of the skull through the vertex without contrast.  Comparison: 04/28/2010  Findings: Area of low density noted in the left occipital lobe and posterior temporal lobe compatible with infarction.  This is likely acute to subacute.  No hemorrhage.  No midline shift or mass lesion.  No hydrocephalus.  Old left thalamic lacunar infarct, stable.  No acute calvarial abnormality.  IMPRESSION: Acute to subacute left occipital and posterior temporal infarct.  These results were discussed with Dr. Lynelle Doctor at the time of interpretation.   Original Report Authenticated By: Charlett Nose, M.D.    Mr [facility] Wo Contrast  08/18/2012  *RADIOLOGY REPORT*  Clinical Data:  Stroke.  Gait disturbance.  Speech disturbance. Abnormal head CT.  MRI HEAD WITHOUT CONTRAST MRA HEAD WITHOUT CONTRAST  Technique:  Multiplanar, multiecho pulse  sequences of the brain and surrounding structures were obtained without intravenous contrast. Angiographic images of the head were obtained using MRA technique without contrast.  Comparison:  Head CT 08/16/2012  MRI HEAD  Findings:  Diffusion imaging shows acute/subacute infarction in the left PCA territory affecting the posteromedial temporal lobe and occipital lobe on the left and the left thalamus.  The area of infarction shows mild swelling without evidence of acute hemorrhage or mass effect/shift.  There are extensive old small vessel infarctions throughout the pons.  There are a few old small vessel  infarctions affecting the cerebellum.  There is an old left thalamic infarction posterior to the acute infarction.  There are chronic small vessel ischemic changes throughout the cerebral hemispheric white matter.  There is a thin subdural hematoma or fluid collection along the posterior convexity on the right, maximal thickness 4 mm.  No significant mass effect.  No evidence of tumor/mass lesion, hydrocephalus or midline shift. No pituitary mass.  No inflammatory sinus disease.  IMPRESSION: Acute/subacute infarction in the left PCA territory affecting the posteromedial temporal lobe and occipital lobe on the left with involvement also of the left thalamus.  Old ischemic changes throughout the pons, cerebellum, left thalamus and hemispheric white matter.  Thin subdural hematoma along the posterior convexity on the right, maximal thickness 4 mm.  MRA HEAD  Findings: Both internal carotid arteries are patent into the brain. There is atherosclerotic narrowing and both carotid siphon and supraclinoid ICA regions with maximal stenosis estimated at 50% on each side.  Beyond that, the anterior and middle cerebral vessels are patent but show widespread atherosclerotic irregularity with narrowing affecting multiple branch vessels.  Both vertebral arteries are patent to the basilar.  The distal vertebral arteries show  narrowing and irregularity.  Maximal stenosis of the left vertebral artery is estimated at 50%.  The basilar artery shows moderate atherosclerotic irregularity but no stenosis.  There is no flow demonstrated in the left PCA, consistent with the region of acute infarction.  There is flow in the right PCA, but the vessel is severely diseased with severe stenosis and limited flow.  IMPRESSION: Severe widespread intracranial atherosclerotic disease with multiple branch vessel narrowings throughout.  Nonvisualization of the left posterior cerebral artery, consistent with acute infarction in that vascular territory.   Original Report Authenticated By: Paulina Fusi, M.D.    Mr Brain Wo Contrast  08/18/2012  *RADIOLOGY REPORT*  Clinical Data:  Stroke.  Gait disturbance.  Speech disturbance. Abnormal head CT.  MRI HEAD WITHOUT CONTRAST MRA HEAD WITHOUT CONTRAST  Technique:  Multiplanar, multiecho pulse sequences of the brain and surrounding structures were obtained without intravenous contrast. Angiographic images of the head were obtained using MRA technique without contrast.  Comparison:  Head CT 08/16/2012  MRI HEAD  Findings:  Diffusion imaging shows acute/subacute infarction in the left PCA territory affecting the posteromedial temporal lobe and occipital lobe on the left and the left thalamus.  The area of infarction shows mild swelling without evidence of acute hemorrhage or mass effect/shift.  There are extensive old small vessel infarctions throughout the pons.  There are a few old small vessel infarctions affecting the cerebellum.  There is an old left thalamic infarction posterior to the acute infarction.  There are chronic small vessel ischemic changes throughout the cerebral hemispheric white matter.  There is a thin subdural hematoma or fluid collection along the posterior convexity on the right, maximal thickness 4 mm.  No significant mass effect.  No evidence of tumor/mass lesion, hydrocephalus or midline  shift. No pituitary mass.  No inflammatory sinus disease.  IMPRESSION: Acute/subacute infarction in the left PCA territory affecting the posteromedial temporal lobe and occipital lobe on the left with involvement also of the left thalamus.  Old ischemic changes throughout the pons, cerebellum, left thalamus and hemispheric white matter.  Thin subdural hematoma along the posterior convexity on the right, maximal thickness 4 mm.  MRA HEAD  Findings: Both internal carotid arteries are patent into the brain. There is atherosclerotic narrowing and both carotid siphon and supraclinoid ICA regions  with maximal stenosis estimated at 50% on each side.  Beyond that, the anterior and middle cerebral vessels are patent but show widespread atherosclerotic irregularity with narrowing affecting multiple branch vessels.  Both vertebral arteries are patent to the basilar.  The distal vertebral arteries show narrowing and irregularity.  Maximal stenosis of the left vertebral artery is estimated at 50%.  The basilar artery shows moderate atherosclerotic irregularity but no stenosis.  There is no flow demonstrated in the left PCA, consistent with the region of acute infarction.  There is flow in the right PCA, but the vessel is severely diseased with severe stenosis and limited flow.  IMPRESSION: Severe widespread intracranial atherosclerotic disease with multiple branch vessel narrowings throughout.  Nonvisualization of the left posterior cerebral artery, consistent with acute infarction in that vascular territory.   Original Report Authenticated By: Paulina Fusi, M.D.    US Carotid Duplex Bilateral  08/19/2012  *RADIOLOGY REPORT*  Clinical Data: Stroke  BILATERAL CAROTID DUPLEX ULTRASOUND  Technique: Gray scale imaging, color Doppler and duplex ultrasound was performed of bilateral carotid and vertebral arteries in the neck.  Comparison:  CT head 08/19/2012; MRI of the brain 08/18/2012  Criteria:  Quantification of carotid stenosis  is based on velocity parameters that correlate the residual internal carotid diameter with NASCET-based stenosis levels, using the diameter of the distal internal carotid lumen as the denominator for stenosis measurement.  The following velocity measurements were obtained:                   PEAK SYSTOLIC/END DIASTOLIC RIGHT ICA:                        97/8cm/sec CCA:                        83/10cm/sec SYSTOLIC ICA/CCA RATIO:     1.2 DIASTOLIC ICA/CCA RATIO:    0.8 ECA:                        91cm/sec  LEFT ICA:                        84/16cm/sec CCA:                        84/15cm/sec SYSTOLIC ICA/CCA RATIO:     1.0 DIASTOLIC ICA/CCA RATIO:    1.0 ECA:                        106cm/sec  Findings:  RIGHT CAROTID ARTERY: Intimal medial thickening throughout the common carotid artery.  There is noncalcified mild atherosclerotic plaque in the bifurcation extending into the proximal internal carotid artery.  No evidence of significant stenosis by gray scale, color Doppler or spectral waveform analysis.  RIGHT VERTEBRAL ARTERY:  Patent with antegrade flow.  LEFT CAROTID ARTERY: Mild intimal medial thickening in the mid common carotid artery.  Heterogeneous, irregular and possibly ulcerated fibrofatty atherosclerotic plaque noted in the distal common carotid artery.  Atherosclerotic plaque extends into the bulb and proximal internal carotid artery.  No significant stenosis by gray scale, color Doppler or spectral waveform analysis.  LEFT VERTEBRAL ARTERY:  Patent with normal antegrade flow.  IMPRESSION:  1.  Mild atherosclerotic plaque in the bilateral proximal internal carotid arteries results in a less than 50% estimated diameter stenosis bilaterally. 2.  Irregular, and possibly  ulcerated fibrofatty plaque in the distal left common carotid artery. 3.  Bilateral vertebral arteries are patent with normal antegrade flow.   Original Report Authenticated By: Malachy Moan, M.D.     Microbiology: No results found for  this or any previous visit (from the past 240 hour(s)).   Labs: Basic Metabolic Panel:  Recent Labs Lab 08/16/12 1231 08/18/12 0420 08/19/12 0547 08/20/12 0555 08/21/12 0850  NA 143 140 141 138 138  K 3.5 2.9* 2.8* 2.9* 3.1*  CL 109 108 108 108 105  CO2 24 22 22 21 21   GLUCOSE 101* 139* 105* 70 92  BUN 22 8 7 6 10   CREATININE 0.99 0.67 0.53 0.59 0.63  CALCIUM 10.1 8.7 9.1 9.1 9.2  MG  --   --  1.9  --   --    Liver Function Tests:  Recent Labs Lab 08/16/12 1231  AST 18  ALT 9  ALKPHOS 39  BILITOT 0.4  PROT 7.6  ALBUMIN 4.0   No results found for this basename: LIPASE, AMYLASE,  in the last 168 hours No results found for this basename: AMMONIA,  in the last 168 hours CBC:  Recent Labs Lab 08/16/12 1231 08/18/12 0420 08/19/12 0547 08/21/12 0850  WBC 6.2 5.9 5.5 5.6  NEUTROABS 4.0  --   --   --   HGB 11.2* 10.0* 10.9* 11.3*  HCT 34.5* 30.8* 33.4* 34.0*  MCV 88.2 88.8 87.7 87.6  PLT 184 152 157 192   Cardiac Enzymes:  Recent Labs Lab 08/16/12 1231  CKTOTAL 123  TROPONINI <0.30   BNP: BNP (last 3 results) No results found for this basename: PROBNP,  in the last 8760 hours CBG:  Recent Labs Lab 08/20/12 1218 08/20/12 1622 08/21/12 0722 08/21/12 1040 08/21/12 1132  GLUCAP 89 93 122* 98 85       Signed:  BLACK,KAREN M  [facility] 08/21/2012, 12:14 PM   Attending addendum: The patient was seen and examined. She was discussed with NP, Ms. Vedia Coffer. Above note annotated. The patient has been eating very little, however, the plan is to discharge her to the skilled nursing facility hoping that a different environment would improve her oral intake. If not, the long-term plan may require PEG tube insertion. This was discussed with her family.  According to this clinical social worker, the patient's current insurance carrier requires prior preauthorization, and therefore, the patient may not be able to be discharged today.

## 2012-08-21 NOTE — Progress Notes (Signed)
Patient refused fingerstick, became combative, daughter at bedside.  

## 2012-08-21 NOTE — Progress Notes (Signed)
Patient ID: [name], female   DOB: 02/05/1924, 79 y.o.   MRN: [id number]  [facility] NEUROLOGY Aryanah Enslow A. Gerilyn Pilgrim, MD     [email or web address]          [name] is an 79 y.o. female.   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 Currently she is off antiplatelet agents because of a tiny right posterior subdural hematoma. Antiplatelet agents will be reconsidered a month from now once the patient has had a repeat scan at that time.  Poststroke depression. She has been started on Prozac. We will see if she does with this.  Patient apparently had some resistance and oppositional behavior overnight. This has been reported by the nurse and also the family. She seems quite pleasant to me this morning however. She followed commands well and tells me that she has been to try to eat her breakfast. Again, she has a dense right homonymous hemianopia. He struggled right side seems improved today. 4+ in both upper and lower extremities.     Objective: Vital signs in last 24 hours: Temp:  [98.4 F (36.9 C)] 98.4 F (36.9 C) (02/20 0730) Pulse Rate:  [66] 66 (02/20 0730) Resp:  [16] 16 (02/20 0730) BP: (146)/(52) 146/52 mmHg (02/20 0730) SpO2:  [100 %] 100 % (02/20 0730)  Intake/Output from previous day: 02/19 0701 - 02/20 0700 In: 120 [P.O.:120] Out: -  Intake/Output this shift:   Nutritional status: Dysphagia   Lab Results: Results for orders placed during the hospital encounter of 08/16/12 (from the past 48 hour(s))  RPR     Status: None   Collection Time    08/19/12  9:13 AM      Result Value Range   RPR NON REACTIVE  NON REACTIVE  HOMOCYSTEINE     Status: None   Collection Time    08/19/12  9:13 AM      Result Value Range   Homocysteine 9.3  4.0 - 15.4 umol/L  VITAMIN B12     Status: Abnormal   Collection Time    08/19/12  9:13 AM      Result Value Range   Vitamin B-12 1150 (*) 211 - 911 pg/mL  TSH     Status: None   Collection Time    08/19/12  9:13 AM      Result Value Range   TSH 2.456  0.350 - 4.500 uIU/mL  GLUCOSE, CAPILLARY     Status: Abnormal   Collection Time    08/19/12  9:50 AM      Result Value Range   Glucose-Capillary 214 (*) 70 - 99 mg/dL   Comment 1 Notify RN    GLUCOSE, CAPILLARY     Status: Abnormal   Collection Time    08/19/12 11:56 AM      Result Value Range   Glucose-Capillary 124 (*) 70 - 99 mg/dL  GLUCOSE, CAPILLARY     Status: None   Collection Time    08/19/12 12:57 PM      Result Value Range   Glucose-Capillary 81  70 - 99 mg/dL  GLUCOSE, CAPILLARY     Status: Abnormal   Collection Time    08/19/12  4:53 PM      Result Value Range   Glucose-Capillary 420 (*) 70 - 99 mg/dL   Comment 1 Notify RN     Comment 2 Documented in Chart    GLUCOSE, CAPILLARY     Status: None   Collection Time  08/19/12  4:56 PM      Result Value Range   Glucose-Capillary 87  70 - 99 mg/dL   Comment 1 Notify RN     Comment 2 Documented in Chart    GLUCOSE, CAPILLARY     Status: Abnormal   Collection Time    08/19/12  8:18 PM      Result Value Range   Glucose-Capillary 192 (*) 70 - 99 mg/dL   Comment 1 Notify RN     Comment 2 Documented in Chart    GLUCOSE, CAPILLARY     Status: Abnormal   Collection Time    08/19/12 11:57 PM      Result Value Range   Glucose-Capillary 131 (*) 70 - 99 mg/dL  GLUCOSE, CAPILLARY     Status: Abnormal   Collection Time    08/20/12  3:44 AM      Result Value Range   Glucose-Capillary 209 (*) 70 - 99 mg/dL  BASIC METABOLIC PANEL     Status: Abnormal   Collection Time    08/20/12  5:55 AM      Result Value Range   Sodium 138  135 - 145 mEq/L   Potassium 2.9 (*) 3.5 - 5.1 mEq/L   Chloride 108  96 - 112 mEq/L   CO2 21  19 - 32 mEq/L   Glucose, Bld 70  70 - 99 mg/dL   BUN 6  6 - 23 mg/dL   Creatinine, Ser 1.61  0.50 - 1.10 mg/dL   Calcium 9.1  8.4 - 09.6 mg/dL   GFR calc non Af Amer 81 (*) >90 mL/min   GFR calc Af Amer >90  >90 mL/min    Comment:            The eGFR has been calculated     using the CKD EPI equation.     This calculation has not been     validated in all clinical     situations.     eGFR's persistently     <90 mL/min signify     possible Chronic Kidney Disease.  GLUCOSE, CAPILLARY     Status: None   Collection Time    08/20/12  7:29 AM      Result Value Range   Glucose-Capillary 88  70 - 99 mg/dL  GLUCOSE, CAPILLARY     Status: None   Collection Time    08/20/12 12:18 PM      Result Value Range   Glucose-Capillary 89  70 - 99 mg/dL  GLUCOSE, CAPILLARY     Status: None   Collection Time    08/20/12  4:22 PM      Result Value Range   Glucose-Capillary 93  70 - 99 mg/dL  GLUCOSE, CAPILLARY     Status: Abnormal   Collection Time    08/21/12  7:22 AM      Result Value Range   Glucose-Capillary 122 (*) 70 - 99 mg/dL   Comment 1 Documented in Chart     Comment 2 Notify RN      Lipid Panel No results found for this basename: CHOL, TRIG, HDL, CHOLHDL, VLDL, LDLCALC,  in the last 72 hours  Studies/Results: Ct Head Wo Contrast  08/19/2012  *RADIOLOGY REPORT*  Clinical Data: Follow up subdural fluid and recent infarctions.  CT HEAD WITHOUT CONTRAST  Technique:  Contiguous axial images were obtained from the base of the skull through the vertex without contrast.  Comparison: 08/18/2012  Findings: MRI showed  a thin focus of extra-axial fluid along the posterior convexity on the right up to 4 mm in thickness.  This collection can be identified on today's examination and appears unchanged.  No additional extra-axial fluid collections. Further evolutionary changes are noted in the patient's documented posterior left posterior cerebral artery territory infarction including a large focal area of subacute infarction in the left thalamus.  These findings were called described on the preceding studies and are stable.  No new findings. These areas of infarction show no significant mass effect and no evidence of  hemorrhage or other complicating features.  Moderate atrophy.  Small vessel white matter ischemic change.  Extensive intracranial atherosclerotic calcifications.  Bony calvarium remains intact.  IMPRESSION: Stable, small and the and subdural hematoma or fluid collection along the posterior convexity on the right.  Stable the left posterior cerebral artery territory infarction.  No new interval changes.   Original Report Authenticated By: Sander Radon, M.D.    US Carotid Duplex Bilateral  08/19/2012  *RADIOLOGY REPORT*  Clinical Data: Stroke  BILATERAL CAROTID DUPLEX ULTRASOUND  Technique: Gray scale imaging, color Doppler and duplex ultrasound was performed of bilateral carotid and vertebral arteries in the neck.  Comparison:  CT head 08/19/2012; MRI of the brain 08/18/2012  Criteria:  Quantification of carotid stenosis is based on velocity parameters that correlate the residual internal carotid diameter with NASCET-based stenosis levels, using the diameter of the distal internal carotid lumen as the denominator for stenosis measurement.  The following velocity measurements were obtained:                   PEAK SYSTOLIC/END DIASTOLIC RIGHT ICA:                        97/8cm/sec CCA:                        83/10cm/sec SYSTOLIC ICA/CCA RATIO:     1.2 DIASTOLIC ICA/CCA RATIO:    0.8 ECA:                        91cm/sec  LEFT ICA:                        84/16cm/sec CCA:                        84/15cm/sec SYSTOLIC ICA/CCA RATIO:     1.0 DIASTOLIC ICA/CCA RATIO:    1.0 ECA:                        106cm/sec  Findings:  RIGHT CAROTID ARTERY: Intimal medial thickening throughout the common carotid artery.  There is noncalcified mild atherosclerotic plaque in the bifurcation extending into the proximal internal carotid artery.  No evidence of significant stenosis by gray scale, color Doppler or spectral waveform analysis.  RIGHT VERTEBRAL ARTERY:  Patent with antegrade flow.  LEFT CAROTID ARTERY: Mild intimal medial  thickening in the mid common carotid artery.  Heterogeneous, irregular and possibly ulcerated fibrofatty atherosclerotic plaque noted in the distal common carotid artery.  Atherosclerotic plaque extends into the bulb and proximal internal carotid artery.  No significant stenosis by gray scale, color Doppler or spectral waveform analysis.  LEFT VERTEBRAL ARTERY:  Patent with normal antegrade flow.  IMPRESSION:  1.  Mild atherosclerotic plaque in the bilateral proximal internal carotid  arteries results in a less than 50% estimated diameter stenosis bilaterally. 2.  Irregular, and possibly ulcerated fibrofatty plaque in the distal left common carotid artery. 3.  Bilateral vertebral arteries are patent with normal antegrade flow.   Original Report Authenticated By: Malachy Moan, M.D.     Medications:  Scheduled Meds: . FLUoxetine  20 mg Oral Daily  . insulin aspart  0-9 Units Subcutaneous Q4H  . ramipril  5 mg Oral Daily  . simvastatin  20 mg Oral q1800   Continuous Infusions: . sodium chloride Stopped (08/19/12 1715)   PRN Meds:.sodium chloride, dextrose, hydrALAZINE, ketorolac, phenol    LOS: 5 days   Jezebelle Ledwell A. Gerilyn Pilgrim, M.D.  Diplomate, Biomedical engineer of Psychiatry and Neurology ( Neurology).

## 2012-08-22 LAB — GLUCOSE, CAPILLARY: Glucose-Capillary: 86 mg/dL (ref 70–99)

## 2012-08-22 NOTE — Clinical Social Work Placement (Addendum)
    Clinical Social Work Department CLINICAL SOCIAL WORK PLACEMENT NOTE 08/22/2012  Patient:  [name], [name]  Account Number:  [id number] Admit date:  08/16/2012  Clinical Social Worker:  Sherrlyn Hock  Date/time:  08/19/2012 02:10 PM  Clinical Social Work is seeking post-discharge placement for this patient at the following level of care:   SKILLED NURSING   (*CSW will update this form in Epic as items are completed)   08/19/2012  Patient/family provided with [facility] Department of Clinical Social Work's list of facilities offering this level of care within the geographic area requested by the patient (or if unable, by the patient's family).  08/19/2012  Patient/family informed of their freedom to choose among providers that offer the needed level of care, that participate in Medicare, Medicaid or managed care program needed by the patient, have an available bed and are willing to accept the patient.  08/19/2012  Patient/family informed of [facility]' ownership interest in [facility], as well as of the fact that they are under no obligation to receive care at this facility.  PASARR submitted to [employer] on  PASARR number received from [employer] on   FL2 transmitted to all facilities in geographic area requested by pt/family on  08/19/2012 FL2 transmitted to all facilities within larger geographic area on   Patient informed that his/her managed care company has contracts with or will negotiate with  certain facilities, including the following:   Has [employer]-- this was NOT discovered until early afternoon on 08/21/12 by the [facility]     Patient/family informed of bed offers received:  08/21/2012 Patient chooses bed at [facility] Physician recommends and patient chooses bed at    Patient to be transferred to [facility] on  08/21/2012 Patient to be transferred to facility by via wheelchair by acute care staff  The following physician request were entered in  Epic:   Additional Comments: previous pasarr number  08/21/12  Late rush to obtain [facility] authorization but this was accomplished with d/c to [facility].  OK per patients neiceConsuella Lose who completed papers at the SNF. Patient is poorly responsive to d/c plan.  Notified pt's nurse of d/c plan. No further CSW needs identified.  CSW signing off.

## 2012-08-22 NOTE — Clinical Social Work Note (Signed)
CSW received report that CSW  Crowder had received word that [facility] received [facility] authorization for SNF placement and patient discharged 08/21/12 to [facility] w [facility] escort via tunnel.  Per CSW Jaci Lazier, family and facility informed and agreeable.  CSW discharge process followed per protocol w discharge summary, FL2 update, discharge packet preparation.  Santa Genera, LCSW Clinical Social Worker [phone])

## 2012-08-24 LAB — GLUCOSE, CAPILLARY: Glucose-Capillary: 97 mg/dL (ref 70–99)

## 2012-08-26 LAB — GLUCOSE, CAPILLARY: Glucose-Capillary: 105 mg/dL — ABNORMAL HIGH (ref 70–99)

## 2012-08-27 LAB — GLUCOSE, CAPILLARY: Glucose-Capillary: 103 mg/dL — ABNORMAL HIGH (ref 70–99)

## 2012-08-28 LAB — GLUCOSE, CAPILLARY: Glucose-Capillary: 108 mg/dL — ABNORMAL HIGH (ref 70–99)

## 2012-08-29 LAB — GLUCOSE, CAPILLARY: Glucose-Capillary: 104 mg/dL — ABNORMAL HIGH (ref 70–99)

## 2012-09-02 ENCOUNTER — Encounter (HOSPITAL_COMMUNITY): Payer: Medicare PPO | Attending: Internal Medicine

## 2012-09-02 ENCOUNTER — Inpatient Hospital Stay (HOSPITAL_COMMUNITY): Payer: Medicare PPO | Attending: Internal Medicine

## 2012-09-02 DIAGNOSIS — E860 Dehydration: Secondary | ICD-10-CM | POA: Insufficient documentation

## 2012-09-02 DIAGNOSIS — R627 Adult failure to thrive: Secondary | ICD-10-CM | POA: Insufficient documentation

## 2012-09-02 MED ORDER — SODIUM CHLORIDE 0.9 % IJ SOLN: 10.0000 mL | Freq: Two times a day (BID) | INTRAMUSCULAR | Status: DC

## 2012-09-02 MED ORDER — SODIUM CHLORIDE 0.9 % IJ SOLN: 10.0000 mL | INTRAMUSCULAR | Status: DC | PRN

## 2012-09-02 NOTE — Progress Notes (Signed)
Single lumen PICC placed per Dr. Jannetta Quint order. Pt diagnosed with dehydration and failure to thrive and  will be receiving fluids. No peripheral access could be found.

## 2012-09-03 LAB — GLUCOSE, CAPILLARY: Glucose-Capillary: 170 mg/dL — ABNORMAL HIGH (ref 70–99)

## 2012-09-04 LAB — GLUCOSE, CAPILLARY: Glucose-Capillary: 96 mg/dL (ref 70–99)

## 2012-09-05 LAB — GLUCOSE, CAPILLARY: Glucose-Capillary: 148 mg/dL — ABNORMAL HIGH (ref 70–99)

## 2012-09-08 LAB — GLUCOSE, CAPILLARY: Glucose-Capillary: 105 mg/dL — ABNORMAL HIGH (ref 70–99)

## 2012-09-09 LAB — GLUCOSE, CAPILLARY: Glucose-Capillary: 102 mg/dL — ABNORMAL HIGH (ref 70–99)

## 2012-09-12 LAB — GLUCOSE, CAPILLARY: Glucose-Capillary: 116 mg/dL — ABNORMAL HIGH (ref 70–99)

## 2012-09-16 ENCOUNTER — Other Ambulatory Visit: Payer: Self-pay | Admitting: *Deleted

## 2012-09-16 MED ORDER — METHYLPHENIDATE HCL 5 MG PO TABS: ORAL_TABLET | ORAL | Status: DC

## 2012-09-18 ENCOUNTER — Ambulatory Visit (HOSPITAL_COMMUNITY)
Admit: 2012-09-18 | Discharge: 2012-09-18 | Disposition: A | Discharge status: Home / Self Care | Payer: Medicare PPO | Source: Ambulatory Visit | Attending: Internal Medicine | Admitting: Internal Medicine

## 2012-09-18 DIAGNOSIS — Z09 Encounter for follow-up examination after completed treatment for conditions other than malignant neoplasm: Secondary | ICD-10-CM | POA: Insufficient documentation

## 2012-09-18 DIAGNOSIS — I6200 Nontraumatic subdural hemorrhage, unspecified: Secondary | ICD-10-CM | POA: Insufficient documentation

## 2012-09-19 LAB — GLUCOSE, CAPILLARY: Glucose-Capillary: 104 mg/dL — ABNORMAL HIGH (ref 70–99)

## 2012-09-20 LAB — GLUCOSE, CAPILLARY: Glucose-Capillary: 48 mg/dL — ABNORMAL LOW (ref 70–99)

## 2012-09-22 LAB — GLUCOSE, CAPILLARY: Glucose-Capillary: 89 mg/dL (ref 70–99)

## 2012-09-24 LAB — GLUCOSE, CAPILLARY: Glucose-Capillary: 99 mg/dL (ref 70–99)

## 2012-09-26 LAB — GLUCOSE, CAPILLARY: Glucose-Capillary: 90 mg/dL (ref 70–99)

## 2012-10-05 ENCOUNTER — Inpatient Hospital Stay (HOSPITAL_COMMUNITY)
Admission: EM | Admit: 2012-10-05 | Discharge: 2012-10-14 | DRG: 166 | Disposition: A | Discharge status: Skilled Nursing Facility | Payer: Medicare PPO | Attending: Internal Medicine | Admitting: Internal Medicine

## 2012-10-05 ENCOUNTER — Other Ambulatory Visit: Payer: Self-pay

## 2012-10-05 ENCOUNTER — Emergency Department (HOSPITAL_COMMUNITY): Payer: Medicare PPO

## 2012-10-05 ENCOUNTER — Encounter (HOSPITAL_COMMUNITY): Payer: Self-pay

## 2012-10-05 DIAGNOSIS — D649 Anemia, unspecified: Secondary | ICD-10-CM | POA: Diagnosis present

## 2012-10-05 DIAGNOSIS — R000 Tachycardia, unspecified: Secondary | ICD-10-CM | POA: Diagnosis present

## 2012-10-05 DIAGNOSIS — E872 Acidosis, unspecified: Secondary | ICD-10-CM | POA: Diagnosis present

## 2012-10-05 DIAGNOSIS — R1314 Dysphagia, pharyngoesophageal phase: Secondary | ICD-10-CM | POA: Diagnosis present

## 2012-10-05 DIAGNOSIS — IMO0002 Reserved for concepts with insufficient information to code with codable children: Secondary | ICD-10-CM

## 2012-10-05 DIAGNOSIS — S065X9A Traumatic subdural hemorrhage with loss of consciousness of unspecified duration, initial encounter: Secondary | ICD-10-CM | POA: Diagnosis present

## 2012-10-05 DIAGNOSIS — I639 Cerebral infarction, unspecified: Secondary | ICD-10-CM | POA: Diagnosis present

## 2012-10-05 DIAGNOSIS — I82409 Acute embolism and thrombosis of unspecified deep veins of unspecified lower extremity: Secondary | ICD-10-CM | POA: Diagnosis present

## 2012-10-05 DIAGNOSIS — I69920 Aphasia following unspecified cerebrovascular disease: Secondary | ICD-10-CM

## 2012-10-05 DIAGNOSIS — R10817 Generalized abdominal tenderness: Secondary | ICD-10-CM | POA: Diagnosis present

## 2012-10-05 DIAGNOSIS — M2420 Disorder of ligament, unspecified site: Secondary | ICD-10-CM | POA: Diagnosis present

## 2012-10-05 DIAGNOSIS — I2699 Other pulmonary embolism without acute cor pulmonale: Principal | ICD-10-CM | POA: Diagnosis present

## 2012-10-05 DIAGNOSIS — M6250 Muscle wasting and atrophy, not elsewhere classified, unspecified site: Secondary | ICD-10-CM | POA: Diagnosis present

## 2012-10-05 DIAGNOSIS — I959 Hypotension, unspecified: Secondary | ICD-10-CM | POA: Diagnosis present

## 2012-10-05 DIAGNOSIS — Z79899 Other long term (current) drug therapy: Secondary | ICD-10-CM

## 2012-10-05 DIAGNOSIS — I679 Cerebrovascular disease, unspecified: Secondary | ICD-10-CM | POA: Diagnosis present

## 2012-10-05 DIAGNOSIS — I10 Essential (primary) hypertension: Secondary | ICD-10-CM | POA: Diagnosis present

## 2012-10-05 DIAGNOSIS — E1142 Type 2 diabetes mellitus with diabetic polyneuropathy: Secondary | ICD-10-CM | POA: Diagnosis present

## 2012-10-05 DIAGNOSIS — Z9181 History of falling: Secondary | ICD-10-CM

## 2012-10-05 DIAGNOSIS — M6289 Other specified disorders of muscle: Secondary | ICD-10-CM | POA: Diagnosis not present

## 2012-10-05 DIAGNOSIS — E46 Unspecified protein-calorie malnutrition: Secondary | ICD-10-CM | POA: Diagnosis present

## 2012-10-05 DIAGNOSIS — I82401 Acute embolism and thrombosis of unspecified deep veins of right lower extremity: Secondary | ICD-10-CM

## 2012-10-05 DIAGNOSIS — H53469 Homonymous bilateral field defects, unspecified side: Secondary | ICD-10-CM | POA: Diagnosis present

## 2012-10-05 DIAGNOSIS — E876 Hypokalemia: Secondary | ICD-10-CM

## 2012-10-05 DIAGNOSIS — R680 Hypothermia, not associated with low environmental temperature: Secondary | ICD-10-CM | POA: Diagnosis present

## 2012-10-05 DIAGNOSIS — M629 Disorder of muscle, unspecified: Secondary | ICD-10-CM | POA: Diagnosis present

## 2012-10-05 DIAGNOSIS — E1149 Type 2 diabetes mellitus with other diabetic neurological complication: Secondary | ICD-10-CM | POA: Diagnosis present

## 2012-10-05 DIAGNOSIS — R5381 Other malaise: Secondary | ICD-10-CM | POA: Diagnosis present

## 2012-10-05 DIAGNOSIS — J9600 Acute respiratory failure, unspecified whether with hypoxia or hypercapnia: Secondary | ICD-10-CM | POA: Diagnosis present

## 2012-10-05 DIAGNOSIS — Z66 Do not resuscitate: Secondary | ICD-10-CM | POA: Diagnosis present

## 2012-10-05 DIAGNOSIS — J9601 Acute respiratory failure with hypoxia: Secondary | ICD-10-CM | POA: Diagnosis present

## 2012-10-05 DIAGNOSIS — R627 Adult failure to thrive: Secondary | ICD-10-CM | POA: Diagnosis present

## 2012-10-05 LAB — CBC
HCT: 32.6 % — ABNORMAL LOW (ref 36.0–46.0)
Hemoglobin: 10.6 g/dL — ABNORMAL LOW (ref 12.0–15.0)
MCV: 88.8 fL (ref 78.0–100.0)
RBC: 3.67 MIL/uL — ABNORMAL LOW (ref 3.87–5.11)
WBC: 8.3 10*3/uL (ref 4.0–10.5)

## 2012-10-05 LAB — COMPREHENSIVE METABOLIC PANEL
Alkaline Phosphatase: 57 U/L (ref 39–117)
BUN: 10 mg/dL (ref 6–23)
CO2: 15 mEq/L — ABNORMAL LOW (ref 19–32)
Chloride: 104 mEq/L (ref 96–112)
Creatinine, Ser: 0.91 mg/dL (ref 0.50–1.10)
GFR calc non Af Amer: 56 mL/min — ABNORMAL LOW (ref >90)
Potassium: 4.4 mEq/L (ref 3.5–5.1)
Total Bilirubin: 0.3 mg/dL (ref 0.3–1.2)

## 2012-10-05 LAB — URINALYSIS, ROUTINE W REFLEX MICROSCOPIC
Nitrite: NEGATIVE
Protein, ur: NEGATIVE mg/dL
Specific Gravity, Urine: >1.03 — ABNORMAL HIGH (ref 1.005–1.030)
Urobilinogen, UA: 0.2 mg/dL (ref 0.0–1.0)

## 2012-10-05 LAB — URINE MICROSCOPIC-ADD ON

## 2012-10-05 LAB — PRO B NATRIURETIC PEPTIDE: Pro B Natriuretic peptide (BNP): 332 pg/mL (ref 0–450)

## 2012-10-05 LAB — TROPONIN I: Troponin I: <0.3 ng/mL (ref <0.30)

## 2012-10-05 MED ORDER — SODIUM CHLORIDE 0.9 % IV BOLUS (SEPSIS)
1000.0000 mL | Freq: Once | INTRAVENOUS | Status: AC
  Administered 2012-10-05: 1000 mL via INTRAVENOUS

## 2012-10-05 MED ORDER — ENOXAPARIN SODIUM 60 MG/0.6ML ~~LOC~~ SOLN
1.0000 mg/kg | Freq: Once | SUBCUTANEOUS | Status: AC
  Administered 2012-10-05: 55 mg via SUBCUTANEOUS

## 2012-10-05 MED ORDER — PANTOPRAZOLE SODIUM 40 MG IV SOLR
40.0000 mg | Freq: Once | INTRAVENOUS | Status: AC
  Administered 2012-10-05: 40 mg via INTRAVENOUS
  Filled 2012-10-05: qty 40

## 2012-10-05 MED ORDER — NOREPINEPHRINE BITARTRATE 1 MG/ML IJ SOLN
2.0000 ug/min | INTRAVENOUS | Status: DC
  Administered 2012-10-06: 2 ug/min via INTRAVENOUS
  Filled 2012-10-05: qty 4

## 2012-10-05 MED ORDER — IOHEXOL 350 MG/ML SOLN
100.0000 mL | Freq: Once | INTRAVENOUS | Status: AC | PRN
  Administered 2012-10-05: 100 mL via INTRAVENOUS

## 2012-10-05 MED ORDER — ENOXAPARIN SODIUM 60 MG/0.6ML ~~LOC~~ SOLN
SUBCUTANEOUS | Status: AC
  Filled 2012-10-05: qty 0.6

## 2012-10-05 NOTE — ED Provider Notes (Addendum)
History  This chart was scribed for Benny Lennert, MD by Bennett Scrape, ED Scribe. This patient was seen in room APA02/APA02 and the patient's care was started at 7:56 PM.  CSN: [id number]  Arrival date & time 10/05/12  1956   First MD Initiated Contact with Patient 10/05/12 1956     Level 5 Caveat-AMS  Chief Complaint  Patient presents with  . Chest Pain  . Back Pain     The history is provided by the patient. No language interpreter was used.    [name] is a 79 y.o. female brought in by ambulance from the [facility], who presents to the Emergency Department complaining of sudden onset of mid sternal chest pain and back pain. EMS reports that pt's BP in route was 70 systolically. Her BP is 68/57 currently. When asked questions, pt responds "Stop. Don't mess."   Past Medical History  Diagnosis Date  . Stroke 2000 and 2002  . Hypertension   . Erosive gastritis 08/29/2011    Per EGD  . Hiatal hernia 08/29/2011    Per EGD  . Diverticulosis 08/30/2011    Per colonoscopy  . Peripheral neuropathy 08/30/2011  . EKG abnormalities February 2013    Lateral inverted T waves  . 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 Subdural hematoma 08/2012    Status post fall  . Dysphagia, pharyngoesophageal phase 08/16/2012  . Type II or unspecified type diabetes mellitus with neurological manifestations, not stated as uncontrolled(250.60)   . Acute left PCA stroke 08/16/2012    Past Surgical History  Procedure Laterality Date  . Abdominal hysterectomy    . Tonsillectomy    . Cholecystectomy    . Appendectomy    . Esophagogastroduodenoscopy  08/28/2011    Procedure: ESOPHAGOGASTRODUODENOSCOPY (EGD);  Surgeon: Malissa Hippo, MD;  Location: [facility];  Service: Endoscopy;  Laterality: N/A;  . Colonoscopy  08/29/2011    Procedure: COLONOSCOPY;  Surgeon: Malissa Hippo, MD;  Location: [facility];  Service: Endoscopy;  Laterality: N/A;    No family history on file.  History   Substance Use Topics  . Smoking status: Never Smoker   . Smokeless tobacco: Not on file  . Alcohol Use: No    No OB history provided.  Review of Systems  Unable to perform ROS: Mental status change    Allergies  Review of patient's allergies indicates no known allergies.  Home Medications   Current Outpatient Rx  Name  Route  Sig  Dispense  Refill  . FLUoxetine (PROZAC) 20 MG capsule   Oral   Take 1 capsule (20 mg total) by mouth daily.   30 capsule   0   . methylphenidate (RITALIN) 5 MG tablet      Take 1 tablet by mouth twice a day in the morning and at noon   60 tablet   0   . EXPIRED: pantoprazole (PROTONIX) 40 MG tablet   Oral   Take 1 tablet (40 mg total) by mouth daily at 12 noon. FOR TREATMENT OF IRRITATION OF YOUR STOMACH LINING.   30 tablet   2   . phenol (CHLORASEPTIC) 1.4 % LIQD   Mouth/Throat   Use as directed 1 spray in the mouth or throat as needed.         . ramipril (ALTACE) 5 MG capsule   Oral   Take 1 capsule (5 mg total) by mouth daily.   30 capsule   0   . simvastatin (ZOCOR)  20 MG tablet   Oral   Take 1 tablet (20 mg total) by mouth daily at 6 PM.   30 tablet   0     Triage Vitals: BP 97/49  Pulse 61  Temp(Src) 96.3 F (35.7 C)  Resp 21  SpO2 79%  Physical Exam  Nursing note and vitals reviewed. Constitutional: She appears well-developed.  HENT:  Head: Normocephalic and atraumatic.  Dry MM  Eyes: Conjunctivae and EOM are normal. No scleral icterus.  Neck: Neck supple. No thyromegaly present.  Cardiovascular: Regular rhythm.  Tachycardia present.  Exam reveals no gallop and no friction rub.   No murmur heard. HR 104  Pulmonary/Chest: Effort normal and breath sounds normal. No stridor. She has no wheezes. She has no rales. She exhibits no tenderness.  Abdominal: She exhibits no distension. There is tenderness (moderate tenderness throughout, worse in the epigastric region). There is no rebound.  Musculoskeletal: Normal  range of motion. She exhibits no edema.  Lymphadenopathy:    She has no cervical adenopathy.  Neurological: Coordination normal.  Skin: No rash noted. No erythema.    ED Course  Procedures (including critical care time)  DIAGNOSTIC STUDIES: Oxygen Saturation is 79% on room air, low by my interpretation.    COORDINATION OF CARE: 8:09 PM- Ordered CBC, CMP, UA and troponin  8:15 PM-Ordered two 1,000 mL of bolus and 40 mg protonix injection  8:20 PM- Pt rechecked and is having diarrhea.  8:49 PM- Pt rechecked and BP is 105/49 after 2L and HR is 89.  8:58 PM-Pt rechecked and is sleeping. BP is 100/46.  9:11 PM-Spoke with Daughter in the room. She states that the pt has had a decreased appetite for the past month. She is drinking less than a glass of fluid on day and eating less 2 to 3 spoonfuls per meal. Daughter states that she had a PICC line placed but the pt pulled it out. She states the pt has chronic abdominal pain due to diverticulitis. Granddaughter states that pt has expressed wishes over wanting to have CPR performed if necessary.   10:00 PM-PEr pt's nurse, BP was 98/36 on last recheck. She states that the pt has a h/o hypotensive episodes and was taken off of bisystolic medication due to hypotension episodes.  10:12 PM-Pt rechecked and BP is 119/59.  10:13 PM-Consult complete with Dr. Phillips Odor, hospitalist. Patient case explained and discussed. Dr. Phillips Odor agrees to admit patient for further evaluation and treatment. Call ended at 10:14 PM.  Labs Reviewed  CBC - Abnormal; Notable for the following:    RBC 3.67 (*)    Hemoglobin 10.6 (*)    HCT 32.6 (*)    All other components within normal limits  COMPREHENSIVE METABOLIC PANEL - Abnormal; Notable for the following:    Sodium 134 (*)    CO2 15 (*)    Glucose, Bld 266 (*)    Total Protein 5.9 (*)    Albumin 2.8 (*)    AST 72 (*)    GFR calc non Af Amer 56 (*)    GFR calc Af Amer 64 (*)    All other components within  normal limits  URINALYSIS, ROUTINE W REFLEX MICROSCOPIC - Abnormal; Notable for the following:    Specific Gravity, Urine >1.030 (*)    Hgb urine dipstick TRACE (*)    Bilirubin Urine MODERATE (*)    Ketones, ur 15 (*)    All other components within normal limits  LACTIC ACID, PLASMA - Abnormal; Notable  for the following:    Lactic Acid, Venous 5.2 (*)    All other components within normal limits  PRO B NATRIURETIC PEPTIDE  TROPONIN I  URINE MICROSCOPIC-ADD ON   Ct Angio Chest Pe W/cm &/or Wo Cm  10/05/2012  *RADIOLOGY REPORT*  Clinical Data:  Chest pain.  Lower mid to upper back pain.  CT ANGIOGRAPHY CHEST AND ABDOMEN  Technique:  Multidetector CT imaging of the chest and abdomen was performed using the standard protocol during bolus administration of intravenous contrast.  Multiplanar CT image reconstructions including MIPs were obtained to evaluate the vascular anatomy.  Contrast: OMNIPAQUE IOHEXOL 350 MG/ML SOLN  Comparison:  Chest x-ray 10/05/2012.  CT abdomen and pelvis 05/30/2009.  CTA CHEST  Findings:  Large pulmonary emboli are evident at the bifurcations of the right than the left main pulmonary artery extending into the lobar arteries bilaterally.  Thrombotic burden is symmetric.  The thoracic aorta is tortuous.  Atherosclerotic calcifications are present within the thoracic aorta.  A standard three-vessel arch configuration is present.  There is no significant aneurysm or dissection.  There is no significant stenosis of the origins of the great vessels.  The heart size is normal.  No significant pleural or pericardial effusion is evident.  Mild bronchiectasis is evident bilaterally.  Minimal dependent atelectasis is worse on the right.  No focal nodule, mass, or airspace disease is present.  The bone windows are unremarkable. The noncontrast study demonstrates no displaced calcifications.   Review of the MIP images confirms the above findings.  IMPRESSION:  1.  Prominent bilateral  pulmonary emboli with extensive thrombus at the right and left main pulmonary artery bifurcations extending into the lobar arteries on both sides. 2.  Atherosclerotic changes in the aorta without evidence for acute dissection or aneurysm. 3.  Standard three-vessel arch configuration.  CTA ABDOMEN  Findings:  Atherosclerotic calcifications and mural plaque present within the abdominal aorta without aneurysm or dissection.  The iliac arteries are normal size but with extensive atherosclerotic calcifications as well.  The celiac artery and superior mesenteric artery are patent.  The renal artery ostia are patent.  The inferior mesenteric artery is opacified.  The liver and spleen are within normal limits.  The stomach, duodenum, and pancreas are normal.  The common bile duct is within normal limits following cholecystectomy.  Adrenal glands are normal bilaterally.  The kidneys and ureters are unremarkable.  A Foley catheter is present within the urinary bladder.  The patient is status post hysterectomy.  The ovaries are not clearly visualized and may be surgically absent.  The rectosigmoid colon is mostly collapsed.  Scattered diverticular changes are present without focal inflammation to suggest acute diverticulitis.  The ascending colon is within normal limits.  The patient is status post cholecystectomy.  The small bowel is unremarkable.  No significant adenopathy or free fluid is present.  Bone windows demonstrates degenerative disc disease at L4-5 and multilevel facet degenerative change.  No focal lytic or blastic lesions are evident   Review of the MIP images confirms the above findings.  IMPRESSION:  1.  Atherosclerotic changes throughout the abdominal aorta branch vessels without aneurysm. 2.  Multilevel degenerative changes in the lumbar spine.   Original Report Authenticated By: Marin Roberts, M.D.    Dg Chest Port 1 View  10/05/2012  *RADIOLOGY REPORT*  Clinical Data: Chest pain  PORTABLE CHEST - 1  VIEW  Comparison: 09/02/2012  Findings: Low lung volumes are present, causing crowding of the pulmonary  vasculature.  Atherosclerotic calcification of the aortic arch noted. No cardiomegaly.  The patient is rotated to the left on today's exam, resulting in reduced diagnostic sensitivity and specificity.   The lungs appear clear.  IMPRESSION:  1.  No acute findings. 2.  Low lung volumes. 3.  Atherosclerosis.   Original Report Authenticated By: Gaylyn Rong, M.D.    Ct Cta Abd/pel W/cm &/or W/o Cm  10/05/2012  *RADIOLOGY REPORT*  Clinical Data:  Chest pain.  Lower mid to upper back pain.  CT ANGIOGRAPHY CHEST AND ABDOMEN  Technique:  Multidetector CT imaging of the chest and abdomen was performed using the standard protocol during bolus administration of intravenous contrast.  Multiplanar CT image reconstructions including MIPs were obtained to evaluate the vascular anatomy.  Contrast: OMNIPAQUE IOHEXOL 350 MG/ML SOLN  Comparison:  Chest x-ray 10/05/2012.  CT abdomen and pelvis 05/30/2009.  CTA CHEST  Findings:  Large pulmonary emboli are evident at the bifurcations of the right than the left main pulmonary artery extending into the lobar arteries bilaterally.  Thrombotic burden is symmetric.  The thoracic aorta is tortuous.  Atherosclerotic calcifications are present within the thoracic aorta.  A standard three-vessel arch configuration is present.  There is no significant aneurysm or dissection.  There is no significant stenosis of the origins of the great vessels.  The heart size is normal.  No significant pleural or pericardial effusion is evident.  Mild bronchiectasis is evident bilaterally.  Minimal dependent atelectasis is worse on the right.  No focal nodule, mass, or airspace disease is present.  The bone windows are unremarkable. The noncontrast study demonstrates no displaced calcifications.   Review of the MIP images confirms the above findings.  IMPRESSION:  1.  Prominent bilateral pulmonary  emboli with extensive thrombus at the right and left main pulmonary artery bifurcations extending into the lobar arteries on both sides. 2.  Atherosclerotic changes in the aorta without evidence for acute dissection or aneurysm. 3.  Standard three-vessel arch configuration.  CTA ABDOMEN  Findings:  Atherosclerotic calcifications and mural plaque present within the abdominal aorta without aneurysm or dissection.  The iliac arteries are normal size but with extensive atherosclerotic calcifications as well.  The celiac artery and superior mesenteric artery are patent.  The renal artery ostia are patent.  The inferior mesenteric artery is opacified.  The liver and spleen are within normal limits.  The stomach, duodenum, and pancreas are normal.  The common bile duct is within normal limits following cholecystectomy.  Adrenal glands are normal bilaterally.  The kidneys and ureters are unremarkable.  A Foley catheter is present within the urinary bladder.  The patient is status post hysterectomy.  The ovaries are not clearly visualized and may be surgically absent.  The rectosigmoid colon is mostly collapsed.  Scattered diverticular changes are present without focal inflammation to suggest acute diverticulitis.  The ascending colon is within normal limits.  The patient is status post cholecystectomy.  The small bowel is unremarkable.  No significant adenopathy or free fluid is present.  Bone windows demonstrates degenerative disc disease at L4-5 and multilevel facet degenerative change.  No focal lytic or blastic lesions are evident   Review of the MIP images confirms the above findings.  IMPRESSION:  1.  Atherosclerotic changes throughout the abdominal aorta branch vessels without aneurysm. 2.  Multilevel degenerative changes in the lumbar spine.   Original Report Authenticated By: Marin Roberts, M.D.     Date: 10/05/2012  Rate:100  Rhythm: normal sinus  rhythm  QRS Axis: normal  Intervals: normal  ST/T Wave  abnormalities: nonspecific ST changes  Conduction Disutrbances:right bundle branch block  Narrative Interpretation:   Old EKG Reviewed: changes noted    No diagnosis found.  CRITICAL CARE Performed by: Elby Blackwelder L   Total critical care time: 45  Critical care time was exclusive of separately billable procedures and treating other patients.  Critical care was necessary to treat or prevent imminent or life-threatening deterioration.  Critical care was time spent personally by me on the following activities: development of treatment plan with patient and/or surrogate as well as nursing, discussions with consultants, evaluation of patient's response to treatment, examination of patient, obtaining history from patient or surrogate, ordering and performing treatments and interventions, ordering and review of laboratory studies, ordering and review of radiographic studies, pulse oximetry and re-evaluation of patient's condition.   MDM  Pe,  Admit to icu  Procedure.  Pt had a femoral stick for blood done by dr.  Estell Harpin on right femoral vein.  No complications  The chart was scribed for me under my direct supervision.  I personally performed the history, physical, and medical decision making and all procedures in the evaluation of this patient.Benny Lennert, MD 10/05/12 2231  Benny Lennert, MD 10/05/12 [phone]

## 2012-10-05 NOTE — ED Notes (Signed)
Pt from [facility] with sudden onset of mid sternal chest pain and back pain.

## 2012-10-06 ENCOUNTER — Inpatient Hospital Stay (HOSPITAL_COMMUNITY): Payer: Medicare PPO

## 2012-10-06 ENCOUNTER — Encounter (HOSPITAL_COMMUNITY): Payer: Self-pay | Admitting: *Deleted

## 2012-10-06 DIAGNOSIS — I959 Hypotension, unspecified: Secondary | ICD-10-CM | POA: Diagnosis present

## 2012-10-06 DIAGNOSIS — J9600 Acute respiratory failure, unspecified whether with hypoxia or hypercapnia: Secondary | ICD-10-CM

## 2012-10-06 DIAGNOSIS — E872 Acidosis: Secondary | ICD-10-CM | POA: Diagnosis present

## 2012-10-06 DIAGNOSIS — R627 Adult failure to thrive: Secondary | ICD-10-CM | POA: Diagnosis present

## 2012-10-06 DIAGNOSIS — J9601 Acute respiratory failure with hypoxia: Secondary | ICD-10-CM | POA: Diagnosis present

## 2012-10-06 DIAGNOSIS — I2699 Other pulmonary embolism without acute cor pulmonale: Secondary | ICD-10-CM | POA: Diagnosis present

## 2012-10-06 DIAGNOSIS — I359 Nonrheumatic aortic valve disorder, unspecified: Secondary | ICD-10-CM

## 2012-10-06 LAB — BASIC METABOLIC PANEL
BUN: 9 mg/dL (ref 6–23)
Chloride: 111 mEq/L (ref 96–112)
Creatinine, Ser: 0.7 mg/dL (ref 0.50–1.10)
GFR calc Af Amer: 88 mL/min — ABNORMAL LOW (ref >90)
Glucose, Bld: 183 mg/dL — ABNORMAL HIGH (ref 70–99)
Potassium: 4.6 mEq/L (ref 3.5–5.1)

## 2012-10-06 LAB — CBC
HCT: 30.5 % — ABNORMAL LOW (ref 36.0–46.0)
MCH: 28.7 pg (ref 26.0–34.0)
MCV: 87.4 fL (ref 78.0–100.0)
Platelets: 213 10*3/uL (ref 150–400)
RBC: 3.49 MIL/uL — ABNORMAL LOW (ref 3.87–5.11)
RDW: 15.5 % (ref 11.5–15.5)
WBC: 7 10*3/uL (ref 4.0–10.5)

## 2012-10-06 LAB — GLUCOSE, CAPILLARY: Glucose-Capillary: 93 mg/dL (ref 70–99)

## 2012-10-06 LAB — HEPARIN LEVEL (UNFRACTIONATED): Heparin Unfractionated: 2 IU/mL — ABNORMAL HIGH (ref 0.30–0.70)

## 2012-10-06 LAB — APTT: aPTT: 38 seconds — ABNORMAL HIGH (ref 24–37)

## 2012-10-06 MED ORDER — ONDANSETRON HCL 4 MG PO TABS: 4.0000 mg | ORAL_TABLET | Freq: Four times a day (QID) | ORAL | Status: DC | PRN

## 2012-10-06 MED ORDER — ALBUTEROL SULFATE (5 MG/ML) 0.5% IN NEBU: 2.5000 mg | INHALATION_SOLUTION | Freq: Four times a day (QID) | RESPIRATORY_TRACT | Status: DC | PRN

## 2012-10-06 MED ORDER — HEPARIN (PORCINE) IN NACL 100-0.45 UNIT/ML-% IJ SOLN
700.0000 [IU]/h | INTRAMUSCULAR | Status: DC
  Administered 2012-10-06: 800 [IU]/h via INTRAVENOUS
  Administered 2012-10-07 (×2): 700 [IU]/h via INTRAVENOUS
  Filled 2012-10-06: qty 250

## 2012-10-06 MED ORDER — ACETAMINOPHEN 650 MG RE SUPP: 650.0000 mg | Freq: Four times a day (QID) | RECTAL | Status: DC | PRN

## 2012-10-06 MED ORDER — SODIUM CHLORIDE 0.9 % IV SOLN
Freq: Once | INTRAVENOUS | Status: AC
  Administered 2012-10-05: 23:00:00 via INTRAVENOUS

## 2012-10-06 MED ORDER — PANTOPRAZOLE SODIUM 40 MG IV SOLR: 40.0000 mg | INTRAVENOUS | Status: DC

## 2012-10-06 MED ORDER — ACETAMINOPHEN 325 MG PO TABS
650.0000 mg | ORAL_TABLET | Freq: Four times a day (QID) | ORAL | Status: DC | PRN
  Administered 2012-10-06 – 2012-10-10 (×3): 650 mg via ORAL
  Filled 2012-10-06: qty 1
  Filled 2012-10-06 (×2): qty 2

## 2012-10-06 MED ORDER — PANTOPRAZOLE SODIUM 40 MG PO TBEC
40.0000 mg | DELAYED_RELEASE_TABLET | Freq: Two times a day (BID) | ORAL | Status: DC
  Administered 2012-10-06 – 2012-10-14 (×16): 40 mg via ORAL
  Filled 2012-10-06 (×15): qty 1

## 2012-10-06 MED ORDER — STERILE WATER FOR INJECTION IV SOLN
INTRAVENOUS | Status: AC
  Administered 2012-10-06: 12:00:00 via INTRAVENOUS
  Filled 2012-10-06: qty 850

## 2012-10-06 MED ORDER — ALBUTEROL SULFATE (5 MG/ML) 0.5% IN NEBU
2.5000 mg | INHALATION_SOLUTION | Freq: Four times a day (QID) | RESPIRATORY_TRACT | Status: DC
  Administered 2012-10-06: 2.5 mg via RESPIRATORY_TRACT
  Filled 2012-10-06: qty 0.5

## 2012-10-06 MED ORDER — ONDANSETRON HCL 4 MG/2ML IJ SOLN
4.0000 mg | Freq: Four times a day (QID) | INTRAMUSCULAR | Status: DC | PRN
  Administered 2012-10-08 – 2012-10-09 (×2): 4 mg via INTRAVENOUS
  Filled 2012-10-06 (×2): qty 2

## 2012-10-06 MED ORDER — SODIUM CHLORIDE 0.9 % IV SOLN
INTRAVENOUS | Status: DC
  Administered 2012-10-06: 03:00:00 via INTRAVENOUS

## 2012-10-06 MED ORDER — MORPHINE SULFATE 2 MG/ML IJ SOLN
2.0000 mg | INTRAMUSCULAR | Status: DC | PRN
  Administered 2012-10-06: 2 mg via INTRAVENOUS
  Filled 2012-10-06: qty 1

## 2012-10-06 MED ORDER — NOREPINEPHRINE BITARTRATE 1 MG/ML IJ SOLN
INTRAMUSCULAR | Status: AC
  Filled 2012-10-06: qty 4

## 2012-10-06 MED ORDER — INSULIN ASPART 100 UNIT/ML ~~LOC~~ SOLN
0.0000 [IU] | Freq: Three times a day (TID) | SUBCUTANEOUS | Status: DC
  Administered 2012-10-09 – 2012-10-12 (×3): 2 [IU] via SUBCUTANEOUS

## 2012-10-06 MED ORDER — MORPHINE SULFATE 2 MG/ML IJ SOLN
2.0000 mg | INTRAMUSCULAR | Status: DC | PRN
  Administered 2012-10-08 – 2012-10-14 (×15): 2 mg via INTRAVENOUS
  Filled 2012-10-06 (×16): qty 1

## 2012-10-06 MED ORDER — SODIUM CHLORIDE 0.9 % IJ SOLN
3.0000 mL | Freq: Two times a day (BID) | INTRAMUSCULAR | Status: DC
  Administered 2012-10-06 – 2012-10-13 (×7): 3 mL via INTRAVENOUS

## 2012-10-06 MED ORDER — HEPARIN (PORCINE) IN NACL 100-0.45 UNIT/ML-% IJ SOLN
950.0000 [IU]/h | INTRAMUSCULAR | Status: DC
  Administered 2012-10-06: 950 [IU]/h via INTRAVENOUS
  Filled 2012-10-06: qty 250

## 2012-10-06 MED ORDER — IPRATROPIUM BROMIDE 0.02 % IN SOLN: 0.5000 mg | Freq: Four times a day (QID) | RESPIRATORY_TRACT | Status: DC | PRN

## 2012-10-06 MED ORDER — ALBUTEROL SULFATE (5 MG/ML) 0.5% IN NEBU: 2.5000 mg | INHALATION_SOLUTION | RESPIRATORY_TRACT | Status: DC | PRN

## 2012-10-06 MED ORDER — IPRATROPIUM BROMIDE 0.02 % IN SOLN
0.5000 mg | Freq: Four times a day (QID) | RESPIRATORY_TRACT | Status: DC
  Administered 2012-10-06: 0.5 mg via RESPIRATORY_TRACT
  Filled 2012-10-06: qty 2.5

## 2012-10-06 NOTE — Care Management Note (Signed)
    Page 1 of 2   10/14/2012     12:06:15 PM   CARE MANAGEMENT NOTE 10/14/2012  Patient:  [name], [name]   Account Number:  [id number]  Date Initiated:  10/06/2012  Documentation initiated by:  Rosemary Holms  Subjective/Objective Assessment:   Pt admitted from [facility] SN[facility] with Bilateral pulmonary emboli, unstable hemodynamicly.     Action/Plan:   Anticipated DC Date:     Anticipated DC Plan:  SKILLED NURSING FACILITY  In-house referral  Clinical Social Worker      DC Planning Services  CM consult      Choice offered to / List presented to:             Status of service:  Completed, signed off Medicare Important Message given?  YES (If response is "NO", the following Medicare IM given date fields will be blank) Date Medicare IM given:  10/14/2012 Date Additional Medicare IM given:    Discharge Disposition:  SKILLED NURSING FACILITY  Per UR Regulation:    If discussed at Long Length of Stay Meetings, dates discussed:   10/14/2012    Comments:  10/14/12 Rosemary Holms RN BSN CM CM spoke to pt's neice [name] to discuss IM. Assured [name] that Md and CSW would be calling her regarding details of discharge.  10/13/12 Abisai Deer Leanord Hawking RN BSN CM CSW working on placement issues regarding denial from [facility] for SNF placement.  10/09/12 Rosemary Holms RN BSN CM Anticipate 2-3 more days until coumadin is therapedic  10/08/12 Rosemary Holms RN BSN CM Pt transferred to 3rd floor. Plans to DC to [facility] when stable.  10/06/12 Oona Trammel Leanord Hawking RN BSN CM

## 2012-10-06 NOTE — Progress Notes (Signed)
Notified Dr. Alanda Slim of heparin level of 2.00 and he said to call pharmacist on call. Pharmacist called and said to turn heparin drip off and he would put in new orders and to restart in 1 hour.

## 2012-10-06 NOTE — Progress Notes (Signed)
Inpatient Diabetes Program Recommendations  AACE/ADA: New Consensus Statement on Inpatient Glycemic Control (2013)  Target Ranges:  Prepandial:   less than 140 mg/dL      Peak postprandial:   less than 180 mg/dL (1-2 hours)      Critically ill patients:  140 - 180 mg/dL   Results for RAMINA, HULET (MRN [id number]) as of 10/06/2012 08:59  Ref. Range 10/05/2012 19:13 10/06/2012 07:54  Glucose-Capillary Latest Range: 70-99 mg/dL 409 (H) 811 (H)  Results for LINNAE, RASOOL (MRN [id number]) as of 10/06/2012 08:59  Ref. Range 10/05/2012 20:27 10/06/2012 04:30  Glucose Latest Range: 70-99 mg/dL 213 (H) 086 (H)    Inpatient Diabetes Program Recommendations Correction (SSI): Please consider changing frequency of CBGs and Novolog correction to Q4H if patient is not eating.  Thanks, Orlando Penner, RN, BSN, CCRN Diabetes Coordinator Inpatient Diabetes Program [phone]

## 2012-10-06 NOTE — H&P (Addendum)
[facility] History and Physical  [name] ZOX:[id number] DOB: 02/05/1924 DOA: 10/05/2012  Referring physician: EDP Zammitt PCP: Colette Ribas, MD  Specialists: none  Chief Complaint: Chest Pain  HPI: [name] is a 79 y.o. female with a PMH significant for hypertension, diabetes, erosive esophagitis and multiple prior strokes who was brought into the emergency department from [facility] nursing home with acute onset chest pain. She was not on any antiplatelet therapy due to prior GI bleeding and subdural hematoma on her last admission in February of 2014. Upon arrival in the emergency department she was hypotensive with her blood pressure systolically in the 60s and an oxygen saturation of 79%. Her blood pressure responded to IV fluids and her oxygen saturations came up above 90 with nonrebreather oxygen. A CT of her chest was performed and revealed very large bilateral pulmonary emboli. [place] was requested for further evaluation and management. The patient is unable to carry out a full conversation she is able to answer simple yes no questions, her granddaughter is at the bedside and states that earlier today the patient was at her baseline.   Review of Systems: Unable to obtain from patient  Past Medical History  Diagnosis Date  . Stroke 2000 and 2002  . Hypertension   . Erosive gastritis 08/29/2011    Per EGD  . Hiatal hernia 08/29/2011    Per EGD  . Diverticulosis 08/30/2011    Per colonoscopy  . Peripheral neuropathy 08/30/2011  . EKG abnormalities February 2013    Lateral inverted T waves  . 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 Subdural hematoma 08/2012    Status post fall  . Dysphagia, pharyngoesophageal phase 08/16/2012  . Type II or unspecified type diabetes mellitus with neurological manifestations, not stated as uncontrolled(250.60)   . Acute left PCA stroke 08/16/2012   Past Surgical History  Procedure Laterality Date  . Abdominal hysterectomy     . Tonsillectomy    . Cholecystectomy    . Appendectomy    . Esophagogastroduodenoscopy  08/28/2011    Procedure: ESOPHAGOGASTRODUODENOSCOPY (EGD);  Surgeon: Malissa Hippo, MD;  Location: [facility];  Service: Endoscopy;  Laterality: N/A;  . Colonoscopy  08/29/2011    Procedure: COLONOSCOPY;  Surgeon: Malissa Hippo, MD;  Location: [facility];  Service: Endoscopy;  Laterality: N/A;   Social History:  reports that she has never smoked. She does not have any smokeless tobacco history on file. She reports that she does not drink alcohol or use illicit drugs.   No Known Allergies  No family history on file. unable to obtain from patient  Prior to Admission medications   Medication Sig Start Date End Date Taking? Authorizing Provider  FLUoxetine (PROZAC) 20 MG/5ML solution Take 20 mg by mouth daily.   Yes Historical Provider, MD  methylphenidate (RITALIN) 5 MG tablet Take 1 tablet by mouth twice a day in the morning and at noon 09/16/12  Yes Kimber Relic, MD  Multiple Vitamin (MULTIVITAMIN WITH MINERALS) TABS Take 1 tablet by mouth daily.   Yes Historical Provider, MD  omeprazole (PRILOSEC) 20 MG capsule Take 20 mg by mouth daily at 12 noon.   Yes Historical Provider, MD  potassium chloride (MICRO-K) 10 MEQ CR capsule Take 30 mEq by mouth 2 (two) times daily with a meal.   Yes Historical Provider, MD  ramipril (ALTACE) 5 MG capsule Take 1 capsule (5 mg total) by mouth daily. 08/21/12  Yes Gwenyth Bender, NP  simvastatin (  ZOCOR) 20 MG tablet Take 1 tablet (20 mg total) by mouth daily at 6 PM. 08/21/12  Yes Gwenyth Bender, NP  UNABLE TO FIND Take by mouth daily. Med Name: MAGIC CUP--Taken daily with lunch to support weight maintenance per nutritional support protocol   Yes Historical Provider, MD  UNABLE TO FIND Take 120 mLs by mouth 2 (two) times daily. Med Name: MIGHTY SHAKE( 4 ounces (oz) of vanilla added to each shake)   Yes Historical Provider, MD   Physical Exam: Filed Vitals:    10/06/12 0330 10/06/12 0345 10/06/12 0400 10/06/12 0415  BP: 118/70 115/66 97/69 116/70  Pulse:   108 105  Temp: 98.9 F (37.2 C) 99 F (37.2 C) 99.1 F (37.3 C) 99 F (37.2 C)  Resp: 19 17 17 15   Height:      Weight:      SpO2:   100% 98%     General:  Critically ill appearing elderly woman, lethargic answers questions appropriately  Eyes: Normal  ENT: Dry mucous membranes  Neck: Supple, prominent JVD  Cardiovascular: Tachycardic, regular rate and rhythm  Respiratory: Decreased breath sounds in bases and anterior auscultation rhonchi  Abdomen: Soft not distended  Skin: No rashes  Musculoskeletal: Muscle atrophy  Psychiatric: Appropriate given medical condition  Neurologic: Limited exam, no obvious new focal abnormalities  Labs on Admission:  Basic Metabolic Panel:  Recent Labs Lab 10/05/12 2027  NA 134*  K 4.4  CL 104  CO2 15*  GLUCOSE 266*  BUN 10  CREATININE 0.91  CALCIUM 8.9   Liver Function Tests:  Recent Labs Lab 10/05/12 2027  AST 72*  ALT 27  ALKPHOS 57  BILITOT 0.3  PROT 5.9*  ALBUMIN 2.8*   No results found for this basename: LIPASE, AMYLASE,  in the last 168 hours No results found for this basename: AMMONIA,  in the last 168 hours CBC:  Recent Labs Lab 10/05/12 2027  WBC 8.3  HGB 10.6*  HCT 32.6*  MCV 88.8  PLT 198   Cardiac Enzymes:  Recent Labs Lab 10/05/12 2027  TROPONINI <0.30    BNP (last 3 results)  Recent Labs  10/05/12 2027  PROBNP 332.0   CBG:  Recent Labs Lab 09/29/12 0508 10/01/12 0612 10/03/12 0507 10/05/12 1913  GLUCAP 82 74 73 152*    Radiological Exams on Admission: Ct Angio Chest Pe W/cm &/or Wo Cm  10/05/2012  **ADDENDUM** CREATED: 10/05/2012 22:12:10  Critical Value/emergent results were called by telephone at the time of interpretation on 10/05/2012 at 10:10 p.m. to Dr. Estell Harpin, who verbally acknowledged these results.  **END ADDENDUM** SIGNED BY: Chauncey Fischer, M.D.    10/05/2012  *RADIOLOGY REPORT*  Clinical Data:  Chest pain.  Lower mid to upper back pain.  CT ANGIOGRAPHY CHEST AND ABDOMEN  Technique:  Multidetector CT imaging of the chest and abdomen was performed using the standard protocol during bolus administration of intravenous contrast.  Multiplanar CT image reconstructions including MIPs were obtained to evaluate the vascular anatomy.  Contrast: OMNIPAQUE IOHEXOL 350 MG/ML SOLN  Comparison:  Chest x-ray 10/05/2012.  CT abdomen and pelvis 05/30/2009.  CTA CHEST  Findings:  Large pulmonary emboli are evident at the bifurcations of the right than the left main pulmonary artery extending into the lobar arteries bilaterally.  Thrombotic burden is symmetric.  The thoracic aorta is tortuous.  Atherosclerotic calcifications are present within the thoracic aorta.  A standard three-vessel arch configuration is present.  There is no significant aneurysm  or dissection.  There is no significant stenosis of the origins of the great vessels.  The heart size is normal.  No significant pleural or pericardial effusion is evident.  Mild bronchiectasis is evident bilaterally.  Minimal dependent atelectasis is worse on the right.  No focal nodule, mass, or airspace disease is present.  The bone windows are unremarkable. The noncontrast study demonstrates no displaced calcifications.   Review of the MIP images confirms the above findings.  IMPRESSION:  1.  Prominent bilateral pulmonary emboli with extensive thrombus at the right and left main pulmonary artery bifurcations extending into the lobar arteries on both sides. 2.  Atherosclerotic changes in the aorta without evidence for acute dissection or aneurysm. 3.  Standard three-vessel arch configuration.  CTA ABDOMEN  Findings:  Atherosclerotic calcifications and mural plaque present within the abdominal aorta without aneurysm or dissection.  The iliac arteries are normal size but with extensive atherosclerotic calcifications as  well.  The celiac artery and superior mesenteric artery are patent.  The renal artery ostia are patent.  The inferior mesenteric artery is opacified.  The liver and spleen are within normal limits.  The stomach, duodenum, and pancreas are normal.  The common bile duct is within normal limits following cholecystectomy.  Adrenal glands are normal bilaterally.  The kidneys and ureters are unremarkable.  A Foley catheter is present within the urinary bladder.  The patient is status post hysterectomy.  The ovaries are not clearly visualized and may be surgically absent.  The rectosigmoid colon is mostly collapsed.  Scattered diverticular changes are present without focal inflammation to suggest acute diverticulitis.  The ascending colon is within normal limits.  The patient is status post cholecystectomy.  The small bowel is unremarkable.  No significant adenopathy or free fluid is present.  Bone windows demonstrates degenerative disc disease at L4-5 and multilevel facet degenerative change.  No focal lytic or blastic lesions are evident   Review of the MIP images confirms the above findings.  IMPRESSION:  1.  Atherosclerotic changes throughout the abdominal aorta branch vessels without aneurysm. 2.  Multilevel degenerative changes in the lumbar spine.   Original Report Authenticated By: Marin Roberts, M.D.    Dg Chest Port 1 View  10/05/2012  *RADIOLOGY REPORT*  Clinical Data: Chest pain  PORTABLE CHEST - 1 VIEW  Comparison: 09/02/2012  Findings: Low lung volumes are present, causing crowding of the pulmonary vasculature.  Atherosclerotic calcification of the aortic arch noted. No cardiomegaly.  The patient is rotated to the left on today's exam, resulting in reduced diagnostic sensitivity and specificity.   The lungs appear clear.  IMPRESSION:  1.  No acute findings. 2.  Low lung volumes. 3.  Atherosclerosis.   Original Report Authenticated By: Gaylyn Rong, M.D.    Ct Cta Abd/pel W/cm &/or W/o  Cm  10/05/2012  **ADDENDUM** CREATED: 10/05/2012 22:12:10  Critical Value/emergent results were called by telephone at the time of interpretation on 10/05/2012 at 10:10 p.m. to Dr. Estell Harpin, who verbally acknowledged these results.  **END ADDENDUM** SIGNED BY: Chauncey Fischer, M.D.   10/05/2012  *RADIOLOGY REPORT*  Clinical Data:  Chest pain.  Lower mid to upper back pain.  CT ANGIOGRAPHY CHEST AND ABDOMEN  Technique:  Multidetector CT imaging of the chest and abdomen was performed using the standard protocol during bolus administration of intravenous contrast.  Multiplanar CT image reconstructions including MIPs were obtained to evaluate the vascular anatomy.  Contrast: OMNIPAQUE IOHEXOL 350 MG/ML SOLN  Comparison:  Chest  x-ray 10/05/2012.  CT abdomen and pelvis 05/30/2009.  CTA CHEST  Findings:  Large pulmonary emboli are evident at the bifurcations of the right than the left main pulmonary artery extending into the lobar arteries bilaterally.  Thrombotic burden is symmetric.  The thoracic aorta is tortuous.  Atherosclerotic calcifications are present within the thoracic aorta.  A standard three-vessel arch configuration is present.  There is no significant aneurysm or dissection.  There is no significant stenosis of the origins of the great vessels.  The heart size is normal.  No significant pleural or pericardial effusion is evident.  Mild bronchiectasis is evident bilaterally.  Minimal dependent atelectasis is worse on the right.  No focal nodule, mass, or airspace disease is present.  The bone windows are unremarkable. The noncontrast study demonstrates no displaced calcifications.   Review of the MIP images confirms the above findings.  IMPRESSION:  1.  Prominent bilateral pulmonary emboli with extensive thrombus at the right and left main pulmonary artery bifurcations extending into the lobar arteries on both sides. 2.  Atherosclerotic changes in the aorta without evidence for acute dissection or  aneurysm. 3.  Standard three-vessel arch configuration.  CTA ABDOMEN  Findings:  Atherosclerotic calcifications and mural plaque present within the abdominal aorta without aneurysm or dissection.  The iliac arteries are normal size but with extensive atherosclerotic calcifications as well.  The celiac artery and superior mesenteric artery are patent.  The renal artery ostia are patent.  The inferior mesenteric artery is opacified.  The liver and spleen are within normal limits.  The stomach, duodenum, and pancreas are normal.  The common bile duct is within normal limits following cholecystectomy.  Adrenal glands are normal bilaterally.  The kidneys and ureters are unremarkable.  A Foley catheter is present within the urinary bladder.  The patient is status post hysterectomy.  The ovaries are not clearly visualized and may be surgically absent.  The rectosigmoid colon is mostly collapsed.  Scattered diverticular changes are present without focal inflammation to suggest acute diverticulitis.  The ascending colon is within normal limits.  The patient is status post cholecystectomy.  The small bowel is unremarkable.  No significant adenopathy or free fluid is present.  Bone windows demonstrates degenerative disc disease at L4-5 and multilevel facet degenerative change.  No focal lytic or blastic lesions are evident   Review of the MIP images confirms the above findings.  IMPRESSION:  1.  Atherosclerotic changes throughout the abdominal aorta branch vessels without aneurysm. 2.  Multilevel degenerative changes in the lumbar spine.   Original Report Authenticated By: Marin Roberts, M.D.     EKG: Independently reviewed. Right bundle branch block, normal sinus rhythm  Assessment/Plan The patient is an 79 year old woman with multiple chronic end-stage medical problems who has been debilitated especially in the past year by multiple strokes and has been unable to take antiplatelets do to GI bleeding and a  subdural hematoma from a fall that occurred during her last hospitalization. She came into the emergency department critically ill and hemodynamically unstable with a significant pulmonary embolism.   I met with the patient's granddaughter and her niece who is the reported but unverified healthcare surrogate to discuss goals of care and to determine the best course of management and level of care needed given the patient's current medical condition. Her niece reports that over the past month she has been declining in health in general she has not been taking adequate nutrition has been unable to participate with rehabilitation, she  is mostly bed or chair bound, but fortunately he is able to communicate with her family although she has difficulty with her memory and periods of confusion.  I had a frank discussion with family about how serious her condition was and that I needed to know what her wishes would be regarding aggressive resuscitation. If she deteriorates to the point of needing intubation or CPR her chance of survival is zero. Intubation would lead to worsening hypotension and reduce her RV output and chest compressions would cause further respiratory collapse and potentially disrupt the saddle embolus causing complete occlusion and death. They agreed to a limited code and I reassured them that everything would be done that is medically feasible and reasonable to treat her. I stressed how serious her condition was and prepared them for the possibility that she may not survive.   1. Acute PE, large clot burden hemodynamically significant, poor prognostic indicators including elevated lactic acid.  Admitted to the intensive care unit  The patient has received a total of 6 L of normal saline in order to maintain a low normal blood pressure, at this point I will reduce the rate of her IV fluids asked him not to overload her RV. We'll start her on norepinephrine to maintain her blood pressure since  she has been adequately hydrated, will strictly monitor her urine output.   Continue to support oxygenation with 100% nonrebreather, titrate as her oxygen saturations can handle.  Started on IV heparin, given her history of prior GI bleed and subdural, will need to closely monitor her neurologic status and her hemoglobin.  Manage her chest wall pain with IV morphine when necessary  Currently she is probably not an intervention candidate and needs to be stabilized here at [facility]. Transport would be high risk for her at this juncture.  2. Diabetes mellitus, glucose control order set  3. Erosive esophagitis and lower GI bleeding history, placed on IV Protonix  4. Cerebrovascular disease, status post multiple strokes, maximize medical therapy as tolerated including resuming her statin, antiplatelet, and blood pressure control when stable.  5. Chronic anemia, will monitor hemoglobin in the setting of anticoagulation.  6. Protein calorie malnutrition, low albumin 2.8, fits with overall picture of chronic disease and failure to thrive.   Code Status: Limited code: No CPR, no intubation, no BiPAP, cardioversion or defibrillation for shockable rhythms, yes to pressors and antiarrhythmics IV Family Communication: Plan discussed in detail with patient's niece and granddaughter Disposition Plan: Maintain in intensive care unit, difficult to determine trajectory at this point patient may not survive this critical pulmonary embolism but she may stabilize, it really depends on whether she can systemically take anticoagulation.  Time spent: 70 minutes  [facility] Pager [phone]  If 7PM-7AM, please contact night-coverage [email or web address] Password [facility] 10/06/2012, 4:23 AM

## 2012-10-06 NOTE — Progress Notes (Signed)
ANTICOAGULATION CONSULT NOTE  Pharmacy Consult for Heparin Indication: pulmonary embolus  No Known Allergies  Patient Measurements: Height: 5\' 6"  (167.6 cm) Weight: 125 lb 10.6 oz (57 kg) IBW/kg (Calculated) : 59.3 Heparin Dosing Weight: 54.4kg  Vital Signs: Temp: 98.9 F (37.2 C) (04/07 1700) BP: 138/65 mmHg (04/07 1700) Pulse Rate: 79 (04/07 1700)  Labs:  Recent Labs  10/05/12 2027 10/06/12 0430 10/06/12 1557  HGB 10.6* 10.0*  --   HCT 32.6* 30.5*  --   PLT 198 213  --   APTT 38*  --   --   LABPROT 16.7*  --   --   INR 1.39  --   --   HEPARINUNFRC  --   --  2.00*  CREATININE 0.91 0.70  --   TROPONINI <0.30  --   --     Estimated Creatinine Clearance: 45.4 ml/min (by C-G formula based on Cr of 0.7).   Medical History: Past Medical History  Diagnosis Date  . Stroke 2000 and 2002  . Hypertension   . Erosive gastritis 08/29/2011    Per EGD  . Hiatal hernia 08/29/2011    Per EGD  . Diverticulosis 08/30/2011    Per colonoscopy  . Peripheral neuropathy 08/30/2011  . EKG abnormalities February 2013    Lateral inverted T waves  . 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 Subdural hematoma 08/2012    Status post fall  . Dysphagia, pharyngoesophageal phase 08/16/2012  . Type II or unspecified type diabetes mellitus with neurological manifestations, not stated as uncontrolled(250.60)   . Acute left PCA stroke 08/16/2012    Medications: Scheduled:  . [COMPLETED] sodium chloride   Intravenous Once  . [COMPLETED] enoxaparin (LOVENOX) injection  1 mg/kg Subcutaneous Once  . insulin aspart  0-15 Units Subcutaneous TID WC  . pantoprazole  40 mg Oral BID  . [COMPLETED] pantoprazole (PROTONIX) IV  40 mg Intravenous Once  . [COMPLETED] sodium chloride  1,000 mL Intravenous Once  . [COMPLETED] sodium chloride  1,000 mL Intravenous Once  . [COMPLETED] sodium chloride  1,000 mL Intravenous Once  . sodium chloride  3 mL Intravenous Q12H  . [DISCONTINUED] albuterol  2.5 mg  Nebulization Q6H  . [DISCONTINUED] ipratropium  0.5 mg Nebulization Q6H  . [DISCONTINUED] pantoprazole (PROTONIX) IV  40 mg Intravenous Q24H    Assessment: Patient with (+)PE per CT report who has received a treatment dose of Enoxaparin at 22:45 PM last evening.  Patient is hemodynamically unstable with a Hx GI bleed, Stroke, Subdural Hematoma  Heparin level above goal.  Verified with RN lab was drawn from arm opposite side in which heparin infusing.  Goal of Therapy:  Heparin level 0.3-0.7 units/ml Monitor platelets by anticoagulation protocol: Yes   Plan:  Hold Heparin x 1 hours. Decrease to 800 units/hr  Check AM Labs 6-8 hours after resuming Heparin.  Mady Gemma 10/06/2012,6:01 PM

## 2012-10-06 NOTE — Progress Notes (Addendum)
[facility] PROGRESS NOTE  [name] ZOX:[id number] DOB: 02/05/1924 DOA: 10/05/2012 PCP: Colette Ribas, MD  Addendum [phone] Patient has remained stable today. Discussed with niece by telephone and updated on care. She will relay message to all family members. She is the point person but there is no formal POA.  Discussed with Dr. Sung Amabile. Not a candidate for intervention given recent stroke and SDH. Recommended heparin, BLE dopplers and IVC filter if positive. Discussed with niece.  Brief narrative: 79 year old woman presented from [facility] with acute chest pain, back pain. Found to be hypothermic hypotensive, tachycardic and hypoxic on room air. CT of the chest revealed bilateral pulmonary emboli with high clot burden. Treated with 2 L bolus with improvement in blood pressure. Admitted for stabilization Not on antiplatelet therapy or anticoagulation secondary to history GI bleed in the past (2013) as well as a traumatic subdural hematoma 08/2012.  Assessment: 1. Acute bilateral pulmonary embolism: Large clot burden hemodynamically significant with associated hypotension and hypoxia on admission. Extensive thrombus at the right and left main pulmonary artery bifurcations extending into the lobar arteries on both sides. Received a total of 6 L of normal saline on admission. Required vasopressor for 5 hours. Started on IV heparin. EKG with borderline right bundle branch block. Now hemodynamically stable. Further evaluation as below. 2. Acute hypoxic respiratory failure: Improved. Secondary pulmonary embolism. Continue IV heparin and oxygen supplementation. 3. Metabolic acidosis: Secondary to lactic acidosis secondary to hypotension.  4. Abdominal tenderness generalized, worse in the epigastric region: Daughter reported pt has chronic abdominal pain due to diverticulitis.  5. Failure to thrive: Her niece reported that over the past month she has been declining in health in general she  has not been taking adequate nutrition has been unable to participate with rehabilitation, she is mostly bed or chair bound, but fortunately he is able to communicate with her family although she has difficulty with her memory and periods of confusion. 6. History of left PCA stroke 08/2012: Was on Aggrenox prior to that admission. Deficits included moderate aphasia, mild right-sided weakness and right homonymous hemianopsia. Patient was placed on aspirin and Plavix but this was discontinued secondary to fall and subdural hematoma 7. Subdural hematoma status post fall 08/2012: Repeat CT that admission demonstrated resolution. No new focal deficits noted. 8. History of erosive gastritis, hiatal hernia, GI bleed: PPI. GI bleed 08/2011. Thought to be diverticular exacerbated by NSAIDs. 9. Diabetes mellitus type 2: Well controlled. Sliding-scale insulin. 10. Normocytic anemia: Stable. 11. Dysphagia: Dysphasia 1. Pure.  Plan: 1. Continue IV heparin. Check bilateral lower extremity venous Dopplers. 2. Repeat cardiac enzymes, EKG, echocardiogram to assess for continued strain. 3. Sodium bicarbonate and IV fluids, repeat basic metabolic panel in the morning  Code Status: Limited code: No CPR, no intubation, no BiPAP. Yes to cardioversion or defibrillation for shockable rhythms, yes to pressors and antiarrhythmics IV Family Communication: None bedside, will contact Disposition Plan: Remain in ICU.  Brendia Sacks, MD  [facility]  Pager [phone] If 7PM-7AM, please contact night-coverage at [email or web address], password [facility] 10/06/2012, 7:39 AM  LOS: 1 day   Consultants:  None  Procedures:  2-D echocardiogram  HPI/Subjective: Afebrile. Normotensive to hypertensive since 0415 this morning. Oxygenation stable 3 L nasal cannula. Never required levophed. Denies chest pain. Some shortness of breath. Minimal epigastric pain. No nausea or vomiting. Discussed with RN--no new issues, has been  hemodynamically stable since admission.  Objective: Filed Vitals:   10/06/12 0500 10/06/12 0515 10/06/12 0530 10/06/12  0656  BP: 151/89 135/72 146/79   Pulse: 103 96 95   Temp: 98.8 F (37.1 C) 98.7 F (37.1 C) 98.8 F (37.1 C)   Resp: 17 17 14    Height:      Weight:      SpO2: 97% 99% 100% 100%    Intake/Output Summary (Last 24 hours) at 10/06/12 0739 Last data filed at 10/06/12 0534  Gross per 24 hour  Intake 355.56 ml  Output   1650 ml  Net -1294.44 ml   Filed Weights   10/05/12 2235 10/06/12 0245  Weight: 54.432 kg (120 lb) 57 kg (125 lb 10.6 oz)    Exam:  General:  Examined in the ICU. Appears very calm and comfortable. Nontoxic. Eyes: PERRL, normal lids, irises  ENT: grossly normal hearing, lips & tongue. Upper and lower dentures present. Cardiovascular: RRR, no m/r/g. No LE edema. Telemetry: SR, no arrhythmias  Respiratory: CTA bilaterally, no w/r/r. Normal respiratory effort. Abdomen: soft, minimal epigastric tenderness, nondistended. Skin: no rash or induration seen  Musculoskeletal: Moves all extremities to command. Exam grossly nonfocal. Psychiatric: grossly normal mood and affect, speech fluent and appropriate. Oriented to location but not month or year. Neurologic: As above. Cranial nerves appear grossly intact.  Data Reviewed: Basic Metabolic Panel:  Recent Labs Lab 10/05/12 2027 10/06/12 0430  NA 134* 139  K 4.4 4.6  CL 104 111  CO2 15* 15*  GLUCOSE 266* 183*  BUN 10 9  CREATININE 0.91 0.70  CALCIUM 8.9 8.1*   Liver Function Tests:  Recent Labs Lab 10/05/12 2027  AST 72*  ALT 27  ALKPHOS 57  BILITOT 0.3  PROT 5.9*  ALBUMIN 2.8*   CBC:  Recent Labs Lab 10/05/12 2027 10/06/12 0430  WBC 8.3 7.0  HGB 10.6* 10.0*  HCT 32.6* 30.5*  MCV 88.8 87.4  PLT 198 213   Cardiac Enzymes:  Recent Labs Lab 10/05/12 2027  TROPONINI <0.30     Recent Labs  10/05/12 2027  PROBNP 332.0   CBG:  Recent Labs Lab 10/01/12 0612  10/03/12 0507 10/05/12 1913  GLUCAP 74 73 152*    Studies: Ct Angio Chest Pe W/cm &/or Wo Cm  10/05/2012  **ADDENDUM** CREATED: 10/05/2012 22:12:10  Critical Value/emergent results were called by telephone at the time of interpretation on 10/05/2012 at 10:10 p.m. to Dr. Estell Harpin, who verbally acknowledged these results.  **END ADDENDUM** SIGNED BY: Chauncey Fischer, M.D.   10/05/2012  *RADIOLOGY REPORT*  Clinical Data:  Chest pain.  Lower mid to upper back pain.  CT ANGIOGRAPHY CHEST AND ABDOMEN  Technique:  Multidetector CT imaging of the chest and abdomen was performed using the standard protocol during bolus administration of intravenous contrast.  Multiplanar CT image reconstructions including MIPs were obtained to evaluate the vascular anatomy.  Contrast: OMNIPAQUE IOHEXOL 350 MG/ML SOLN  Comparison:  Chest x-ray 10/05/2012.  CT abdomen and pelvis 05/30/2009.  CTA CHEST  Findings:  Large pulmonary emboli are evident at the bifurcations of the right than the left main pulmonary artery extending into the lobar arteries bilaterally.  Thrombotic burden is symmetric.  The thoracic aorta is tortuous.  Atherosclerotic calcifications are present within the thoracic aorta.  A standard three-vessel arch configuration is present.  There is no significant aneurysm or dissection.  There is no significant stenosis of the origins of the great vessels.  The heart size is normal.  No significant pleural or pericardial effusion is evident.  Mild bronchiectasis is evident bilaterally.  Minimal  dependent atelectasis is worse on the right.  No focal nodule, mass, or airspace disease is present.  The bone windows are unremarkable. The noncontrast study demonstrates no displaced calcifications.   Review of the MIP images confirms the above findings.  IMPRESSION:  1.  Prominent bilateral pulmonary emboli with extensive thrombus at the right and left main pulmonary artery bifurcations extending into the lobar arteries on  both sides. 2.  Atherosclerotic changes in the aorta without evidence for acute dissection or aneurysm. 3.  Standard three-vessel arch configuration.  CTA ABDOMEN  Findings:  Atherosclerotic calcifications and mural plaque present within the abdominal aorta without aneurysm or dissection.  The iliac arteries are normal size but with extensive atherosclerotic calcifications as well.  The celiac artery and superior mesenteric artery are patent.  The renal artery ostia are patent.  The inferior mesenteric artery is opacified.  The liver and spleen are within normal limits.  The stomach, duodenum, and pancreas are normal.  The common bile duct is within normal limits following cholecystectomy.  Adrenal glands are normal bilaterally.  The kidneys and ureters are unremarkable.  A Foley catheter is present within the urinary bladder.  The patient is status post hysterectomy.  The ovaries are not clearly visualized and may be surgically absent.  The rectosigmoid colon is mostly collapsed.  Scattered diverticular changes are present without focal inflammation to suggest acute diverticulitis.  The ascending colon is within normal limits.  The patient is status post cholecystectomy.  The small bowel is unremarkable.  No significant adenopathy or free fluid is present.  Bone windows demonstrates degenerative disc disease at L4-5 and multilevel facet degenerative change.  No focal lytic or blastic lesions are evident   Review of the MIP images confirms the above findings.  IMPRESSION:  1.  Atherosclerotic changes throughout the abdominal aorta branch vessels without aneurysm. 2.  Multilevel degenerative changes in the lumbar spine.   Original Report Authenticated By: Marin Roberts, M.D.    Dg Chest Port 1 View  10/05/2012  *RADIOLOGY REPORT*  Clinical Data: Chest pain  PORTABLE CHEST - 1 VIEW  Comparison: 09/02/2012  Findings: Low lung volumes are present, causing crowding of the pulmonary vasculature.  Atherosclerotic  calcification of the aortic arch noted. No cardiomegaly.  The patient is rotated to the left on today's exam, resulting in reduced diagnostic sensitivity and specificity.   The lungs appear clear.  IMPRESSION:  1.  No acute findings. 2.  Low lung volumes. 3.  Atherosclerosis.   Original Report Authenticated By: Gaylyn Rong, M.D.    Ct Cta Abd/pel W/cm &/or W/o Cm  10/05/2012  **ADDENDUM** CREATED: 10/05/2012 22:12:10  Critical Value/emergent results were called by telephone at the time of interpretation on 10/05/2012 at 10:10 p.m. to Dr. Estell Harpin, who verbally acknowledged these results.  **END ADDENDUM** SIGNED BY: Chauncey Fischer, M.D.   10/05/2012  *RADIOLOGY REPORT*  Clinical Data:  Chest pain.  Lower mid to upper back pain.  CT ANGIOGRAPHY CHEST AND ABDOMEN  Technique:  Multidetector CT imaging of the chest and abdomen was performed using the standard protocol during bolus administration of intravenous contrast.  Multiplanar CT image reconstructions including MIPs were obtained to evaluate the vascular anatomy.  Contrast: OMNIPAQUE IOHEXOL 350 MG/ML SOLN  Comparison:  Chest x-ray 10/05/2012.  CT abdomen and pelvis 05/30/2009.  CTA CHEST  Findings:  Large pulmonary emboli are evident at the bifurcations of the right than the left main pulmonary artery extending into the lobar arteries bilaterally.  Thrombotic burden is symmetric.  The thoracic aorta is tortuous.  Atherosclerotic calcifications are present within the thoracic aorta.  A standard three-vessel arch configuration is present.  There is no significant aneurysm or dissection.  There is no significant stenosis of the origins of the great vessels.  The heart size is normal.  No significant pleural or pericardial effusion is evident.  Mild bronchiectasis is evident bilaterally.  Minimal dependent atelectasis is worse on the right.  No focal nodule, mass, or airspace disease is present.  The bone windows are unremarkable. The noncontrast  study demonstrates no displaced calcifications.   Review of the MIP images confirms the above findings.  IMPRESSION:  1.  Prominent bilateral pulmonary emboli with extensive thrombus at the right and left main pulmonary artery bifurcations extending into the lobar arteries on both sides. 2.  Atherosclerotic changes in the aorta without evidence for acute dissection or aneurysm. 3.  Standard three-vessel arch configuration.  CTA ABDOMEN  Findings:  Atherosclerotic calcifications and mural plaque present within the abdominal aorta without aneurysm or dissection.  The iliac arteries are normal size but with extensive atherosclerotic calcifications as well.  The celiac artery and superior mesenteric artery are patent.  The renal artery ostia are patent.  The inferior mesenteric artery is opacified.  The liver and spleen are within normal limits.  The stomach, duodenum, and pancreas are normal.  The common bile duct is within normal limits following cholecystectomy.  Adrenal glands are normal bilaterally.  The kidneys and ureters are unremarkable.  A Foley catheter is present within the urinary bladder.  The patient is status post hysterectomy.  The ovaries are not clearly visualized and may be surgically absent.  The rectosigmoid colon is mostly collapsed.  Scattered diverticular changes are present without focal inflammation to suggest acute diverticulitis.  The ascending colon is within normal limits.  The patient is status post cholecystectomy.  The small bowel is unremarkable.  No significant adenopathy or free fluid is present.  Bone windows demonstrates degenerative disc disease at L4-5 and multilevel facet degenerative change.  No focal lytic or blastic lesions are evident   Review of the MIP images confirms the above findings.  IMPRESSION:  1.  Atherosclerotic changes throughout the abdominal aorta branch vessels without aneurysm. 2.  Multilevel degenerative changes in the lumbar spine.   Original Report  Authenticated By: Marin Roberts, M.D.     Scheduled Meds: . albuterol  2.5 mg Nebulization Q6H  . insulin aspart  0-15 Units Subcutaneous TID WC  . ipratropium  0.5 mg Nebulization Q6H  . pantoprazole (PROTONIX) IV  40 mg Intravenous Q24H  . sodium chloride  3 mL Intravenous Q12H   Continuous Infusions: . sodium chloride 125 mL/hr at 10/06/12 0500  . heparin 950 Units/hr (10/06/12 0542)  . norepinephrine (LEVOPHED) Adult infusion Stopped (10/06/12 0534)    Principal Problem:   Acute pulmonary embolism Active Problems:   Anemia   Type II or unspecified type diabetes mellitus with neurological manifestations, not stated as uncontrolled(250.60)   CVA (cerebral infarction)   Subdural hematoma   Homonymous hemianopia   Dysphagia, pharyngoesophageal phase   Hypotension, unspecified   Acute respiratory failure with hypoxia   Metabolic acidosis   Adult failure to thrive   Brendia Sacks, MD  [facility] Pager [phone] If 7PM-7AM, please contact night-coverage at [email or web address], password [facility] 10/06/2012, 7:39 AM  LOS: 1 day   Time spent: 40 minutes

## 2012-10-06 NOTE — ED Notes (Signed)
All fluids stopped and levophed started/

## 2012-10-06 NOTE — Progress Notes (Signed)
Utilization Review Complete  

## 2012-10-06 NOTE — Clinical Social Work Psychosocial (Signed)
    Clinical Social Work Department BRIEF PSYCHOSOCIAL ASSESSMENT 10/06/2012  Patient:  [name], [name]     Account Number:  [id number]     Admit date:  10/05/2012  Clinical Social Worker:  Santa Genera, CLINICAL SOCIAL WORKER  Date/Time:  10/06/2012 09:00 AM  Referred by:  Physician  Date Referred:  10/06/2012 Referred for  SNF Placement   Other Referral:   Interview type:  Patient Other interview type:   Also spoke w family.    PSYCHOSOCIAL DATA Living Status:  FACILITY Admitted from facility:  [facility] Level of care:  Skilled Nursing Facility Primary support name:  [name] Primary support relationship to patient:  SIBLING Degree of support available:   Adequate    CURRENT CONCERNS Current Concerns  Post-Acute Placement   Other Concerns:    SOCIAL WORK ASSESSMENT / PLAN CSW met w patient at bedside, patient alert and talkative, however not fully oriented.  Patient stated she was not certain of date/timeand that she was in [facility]. Easily corrected to stating that she was at [facility]. Named her sister as primary visitor, said she has lots of children and other relatives who visit her at [facility].  Patient easily fatigued, CSW decided to talk w patient sister w patient permission.    CSW spoke w sister, [name].  Sister is 52 years old and says that patients niece [name] takes care of "business" for patient.  States patient has been resident of [facility] for about a month, was also recently at [facility] after a fall at sister's home.  Patient lived by herself and did not keep family aware of her needs, family surprised about patient's physical decline and falls.  Per sister, patient fell while inpatient and also "slid out of chair" at [facility] recently.  Sister states that patient is now "hardly ever herself" and "talks about visits from dead people."  Sister says patient fluctuates in her level of orientation.  Per  sister, patient is actively rehabbing at [facility], but requires significant assistance w ADLs and much supervision to prevent falls.    [facility] admissions confirmed that patient is resident, walks w rolling walker, requires assistance w most ADLs, "does not say much" so diffiuclt to determine mental status.  Says that patient has family support while at SNF, but no family is willing to take her home.  [name] has been contacted by SNF and says family wants to pay bed hold in order to keep bed secure.   Assessment/plan status:  Psychosocial Support/Ongoing Assessment of Needs Other assessment/ plan:   Information/referral to community resources:   None needed at this time.    PATIENT'S/FAMILY'S RESPONSE TO PLAN OF CARE: Family appreciative of CSW contact re patient and discharge    Santa Genera, [place] Clinical Social Worker [phone])

## 2012-10-06 NOTE — Progress Notes (Signed)
*  PRELIMINARY RESULTS* Echocardiogram 2D Echocardiogram has been performed.  Conrad Emerald 10/06/2012, 4:28 PM

## 2012-10-06 NOTE — Progress Notes (Signed)
ANTICOAGULATION CONSULT NOTE  Pharmacy Consult for Heparin Indication: pulmonary embolus  No Known Allergies  Patient Measurements: Height: 5' 2.99" (160 cm) Weight: 120 lb (54.432 kg) IBW/kg (Calculated) : 52.38 Heparin Dosing Weight: 54.4kg  Vital Signs: Temp: 96.9 F (36.1 C) (04/07 0030) BP: 99/51 mmHg (04/07 0030) Pulse Rate: 108 (04/07 0015)  Labs:  Recent Labs  10/05/12 2027  HGB 10.6*  HCT 32.6*  PLT 198  CREATININE 0.91  TROPONINI <0.30    Estimated Creatinine Clearance: 36.7 ml/min (by C-G formula based on Cr of 0.91).   Medical History: Past Medical History  Diagnosis Date  . Stroke 2000 and 2002  . Hypertension   . Erosive gastritis 08/29/2011    Per EGD  . Hiatal hernia 08/29/2011    Per EGD  . Diverticulosis 08/30/2011    Per colonoscopy  . Peripheral neuropathy 08/30/2011  . EKG abnormalities February 2013    Lateral inverted T waves  . 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 Subdural hematoma 08/2012    Status post fall  . Dysphagia, pharyngoesophageal phase 08/16/2012  . Type II or unspecified type diabetes mellitus with neurological manifestations, not stated as uncontrolled(250.60)   . Acute left PCA stroke 08/16/2012    Medications:   (Not in a hospital admission) Scheduled:  . [COMPLETED] enoxaparin (LOVENOX) injection  1 mg/kg Subcutaneous Once  . [COMPLETED] pantoprazole (PROTONIX) IV  40 mg Intravenous Once    Assessment: Patient with (+)PE per CT report who has received a treatment dose of Enoxaparin at 22:45 PM.  Patient is hemodynamically unstable with a Hx GI bleed, Stroke, Subdural Hematoma  Goal of Therapy:  Heparin level 0.3-0.7 units/ml Monitor platelets by anticoagulation protocol: Yes   Plan:  Begin Heparin 8 hours after Lovenox dose. Start heparin infusion at 950 units/hr Check anti-Xa level in 6-8 hours and daily while on heparin Continue to monitor H&H and platelets  Mady Gemma 10/06/2012,12:41 AM

## 2012-10-07 ENCOUNTER — Encounter (HOSPITAL_COMMUNITY): Payer: Self-pay | Admitting: Radiology

## 2012-10-07 ENCOUNTER — Ambulatory Visit (HOSPITAL_COMMUNITY)
Admit: 2012-10-07 | Discharge: 2012-10-07 | Disposition: A | Discharge status: Home / Self Care | Payer: Medicare PPO | Attending: Family Medicine | Admitting: Family Medicine

## 2012-10-07 DIAGNOSIS — R627 Adult failure to thrive: Secondary | ICD-10-CM

## 2012-10-07 DIAGNOSIS — I82409 Acute embolism and thrombosis of unspecified deep veins of unspecified lower extremity: Secondary | ICD-10-CM | POA: Diagnosis present

## 2012-10-07 LAB — CBC
MCHC: 33.2 g/dL (ref 30.0–36.0)
Platelets: 180 10*3/uL (ref 150–400)
RDW: 15.5 % (ref 11.5–15.5)
WBC: 4.4 10*3/uL (ref 4.0–10.5)

## 2012-10-07 LAB — PROTIME-INR
INR: 1.25 (ref 0.00–1.49)
Prothrombin Time: 15.5 seconds — ABNORMAL HIGH (ref 11.6–15.2)

## 2012-10-07 LAB — GLUCOSE, CAPILLARY

## 2012-10-07 LAB — BASIC METABOLIC PANEL
Calcium: 8.2 mg/dL — ABNORMAL LOW (ref 8.4–10.5)
Creatinine, Ser: 0.63 mg/dL (ref 0.50–1.10)
GFR calc Af Amer: >90 mL/min (ref >90)

## 2012-10-07 LAB — HEPARIN LEVEL (UNFRACTIONATED): Heparin Unfractionated: 1.14 IU/mL — ABNORMAL HIGH (ref 0.30–0.70)

## 2012-10-07 MED ORDER — DEXTROSE-NACL 5-0.45 % IV SOLN
INTRAVENOUS | Status: DC
  Administered 2012-10-07: 50 mL/h via INTRAVENOUS

## 2012-10-07 MED ORDER — MIDAZOLAM HCL 2 MG/2ML IJ SOLN
INTRAMUSCULAR | Status: AC
  Filled 2012-10-07: qty 2

## 2012-10-07 MED ORDER — FENTANYL CITRATE 0.05 MG/ML IJ SOLN
INTRAMUSCULAR | Status: AC
  Filled 2012-10-07: qty 2

## 2012-10-07 MED ORDER — PRO-STAT SUGAR FREE PO LIQD
30.0000 mL | Freq: Three times a day (TID) | ORAL | Status: DC
  Administered 2012-10-08 – 2012-10-14 (×17): 30 mL via ORAL
  Filled 2012-10-07 (×17): qty 30

## 2012-10-07 MED ORDER — BOOST / RESOURCE BREEZE PO LIQD
1.0000 | Freq: Three times a day (TID) | ORAL | Status: DC
  Administered 2012-10-07 – 2012-10-14 (×14): 1 via ORAL

## 2012-10-07 MED ORDER — IOHEXOL 300 MG/ML  SOLN
100.0000 mL | Freq: Once | INTRAMUSCULAR | Status: AC | PRN
  Administered 2012-10-07: 35 mL via INTRAVENOUS

## 2012-10-07 MED ORDER — FENTANYL CITRATE 0.05 MG/ML IJ SOLN
INTRAMUSCULAR | Status: AC | PRN
  Administered 2012-10-07: 12.5 ug via INTRAVENOUS

## 2012-10-07 MED ORDER — HEPARIN (PORCINE) IN NACL 100-0.45 UNIT/ML-% IJ SOLN
700.0000 [IU]/h | INTRAMUSCULAR | Status: DC
  Administered 2012-10-07 – 2012-10-10 (×3): 700 [IU]/h via INTRAVENOUS
  Filled 2012-10-07 (×2): qty 250

## 2012-10-07 NOTE — Progress Notes (Signed)
PT HAS RETURNED FROM [facility]. VSS. PT ALERT. LAB CALLED TO DRAW BLOOD FOR UNFRACTIONATED HEPARIN LEVEL.

## 2012-10-07 NOTE — Progress Notes (Signed)
ANTICOAGULATION CONSULT NOTE  Pharmacy Consult for Heparin Indication: pulmonary embolus  No Known Allergies  Patient Measurements: Height: 5\' 6"  (167.6 cm) Weight: 143 lb 8.3 oz (65.1 kg) IBW/kg (Calculated) : 59.3 Heparin Dosing Weight: 54.4kg  Vital Signs: Temp: 99.1 F (37.3 C) (04/08 1000) Temp src: Oral (04/08 0730) BP: 167/67 mmHg (04/08 1320) Pulse Rate: 70 (04/08 1320)  Labs:  Recent Labs  10/05/12 2027 10/06/12 0430 10/06/12 1557 10/07/12 0244 10/07/12 1421  HGB 10.6* 10.0*  --  9.4*  --   HCT 32.6* 30.5*  --  28.3*  --   PLT 198 213  --  180  --   APTT 38*  --   --   --   --   LABPROT 16.7*  --   --  15.5*  --   INR 1.39  --   --  1.25  --   HEPARINUNFRC  --   --  2.00* 1.14* 0.69  CREATININE 0.91 0.70  --  0.63  --   TROPONINI <0.30  --   --   --   --    Estimated Creatinine Clearance: 47.3 ml/min (by C-G formula based on Cr of 0.63).  Medical History: Past Medical History  Diagnosis Date  . Stroke 2000 and 2002  . Hypertension   . Erosive gastritis 08/29/2011    Per EGD  . Hiatal hernia 08/29/2011    Per EGD  . Diverticulosis 08/30/2011    Per colonoscopy  . Peripheral neuropathy 08/30/2011  . EKG abnormalities February 2013    Lateral inverted T waves  . 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 Subdural hematoma 08/2012    Status post fall  . Dysphagia, pharyngoesophageal phase 08/16/2012  . Type II or unspecified type diabetes mellitus with neurological manifestations, not stated as uncontrolled(250.60)   . Acute left PCA stroke 08/16/2012   Medications: Scheduled:  . insulin aspart  0-15 Units Subcutaneous TID WC  . pantoprazole  40 mg Oral BID  . sodium chloride  3 mL Intravenous Q12H   Assessment: Patient with (+)PE per CT report and was started on IV Heparin.  Pt went to [facility] today and had IVC filter placed.  Heparin level checked on patients return is within therapeutic range.  Pt is now hemodynamically stable per MD eval.    Goal of  Therapy:  Heparin level 0.3-0.7 units/ml Monitor platelets by anticoagulation protocol: Yes   Plan:  Resume Heparin at 700 units/hr  Check heparin level tonight & daily CBC daily while on Heparin Duration of heparin Rx per MD  Valrie Hart A 10/07/2012,3:41 PM

## 2012-10-07 NOTE — ED Notes (Signed)
[facility] here for transport abck to [facility].   Report given.  Tolerated procedure well.  VSS

## 2012-10-07 NOTE — Procedures (Signed)
Procedure:  IVC filter placement Access:  Right IJ vein Bard Denali IVC filter placed in infrarenal IVC.

## 2012-10-07 NOTE — ED Notes (Signed)
Heparin stopped per Dr Yamagata 

## 2012-10-07 NOTE — Interval H&P Note (Cosign Needed)
History and Physical Interval Note:  10/07/2012 11:15 AM  [name]  has been admitted with bilateral PE and is found to have LE DVT as well. She has hx of GI bleed and recent SDH when she fell from an acute CVA. She is being anticoagulated, but IR is requested to place IVC filter.  The various methods of treatment have been discussed with the patient and family. After consideration of risks, benefits and other options for treatment, the patient has consented to placement of IVC filter intervention .  The patient's history has been reviewed, patient examined, no change in status, stable for procedure.  I have reviewed the patient's chart and labs.  Questions were answered to the patient's satisfaction.   BP 144/61  Pulse 71  Temp(Src) 99.1 F (37.3 C) (Oral)  Resp 16  Ht 5\' 6"  (1.676 m)  Wt 143 lb 8.3 oz (65.1 kg)  BMI 23.18 kg/m2  SpO2 99% ENT: clear airway Neck: supple, no JVD, masses, trachea midline Lungs: CTA Heart: Reg rate and rhythm Abd: soft, NT  Results for orders placed during the hospital encounter of 10/05/12  MRSA PCR SCREENING      Result Value Range   MRSA by PCR NEGATIVE  NEGATIVE  CBC      Result Value Range   WBC 8.3  4.0 - 10.5 K/uL   RBC 3.67 (*) 3.87 - 5.11 MIL/uL   Hemoglobin 10.6 (*) 12.0 - 15.0 g/dL   HCT 16.1 (*) 09.6 - 04.5 %   MCV 88.8  78.0 - 100.0 fL   MCH 28.9  26.0 - 34.0 pg   MCHC 32.5  30.0 - 36.0 g/dL   RDW 40.9  81.1 - 91.4 %   Platelets 198  150 - 400 K/uL  COMPREHENSIVE METABOLIC PANEL      Result Value Range   Sodium 134 (*) 135 - 145 mEq/L   Potassium 4.4  3.5 - 5.1 mEq/L   Chloride 104  96 - 112 mEq/L   CO2 15 (*) 19 - 32 mEq/L   Glucose, Bld 266 (*) 70 - 99 mg/dL   BUN 10  6 - 23 mg/dL   Creatinine, Ser 7.82  0.50 - 1.10 mg/dL   Calcium 8.9  8.4 - 95.6 mg/dL   Total Protein 5.9 (*) 6.0 - 8.3 g/dL   Albumin 2.8 (*) 3.5 - 5.2 g/dL   AST 72 (*) 0 - 37 U/L   ALT 27  0 - 35 U/L   Alkaline Phosphatase 57  39 - 117 U/L    Total Bilirubin 0.3  0.3 - 1.2 mg/dL   GFR calc non Af Amer 56 (*) >90 mL/min   GFR calc Af Amer 64 (*) >90 mL/min  PRO B NATRIURETIC PEPTIDE      Result Value Range   Pro B Natriuretic peptide (BNP) 332.0  0 - 450 pg/mL  TROPONIN I      Result Value Range   Troponin I <0.30  <0.30 ng/mL  URINALYSIS, ROUTINE W REFLEX MICROSCOPIC      Result Value Range   Color, Urine YELLOW  YELLOW   APPearance CLEAR  CLEAR   Specific Gravity, Urine >1.030 (*) 1.005 - 1.030   pH 5.5  5.0 - 8.0   Glucose, UA NEGATIVE  NEGATIVE mg/dL   Hgb urine dipstick TRACE (*) NEGATIVE   Bilirubin Urine MODERATE (*) NEGATIVE   Ketones, ur 15 (*) NEGATIVE mg/dL   Protein, ur NEGATIVE  NEGATIVE mg/dL  Urobilinogen, UA 0.2  0.0 - 1.0 mg/dL   Nitrite NEGATIVE  NEGATIVE   Leukocytes, UA NEGATIVE  NEGATIVE  LACTIC ACID, PLASMA      Result Value Range   Lactic Acid, Venous 5.2 (*) 0.5 - 2.2 mmol/L  URINE MICROSCOPIC-ADD ON      Result Value Range   Squamous Epithelial / LPF RARE  RARE   WBC, UA 3-6  <3 WBC/hpf   RBC / HPF 3-6  <3 RBC/hpf  APTT      Result Value Range   aPTT 38 (*) 24 - 37 seconds  PROTIME-INR      Result Value Range   Prothrombin Time 16.7 (*) 11.6 - 15.2 seconds   INR 1.39  0.00 - 1.49  CBC      Result Value Range   WBC 7.0  4.0 - 10.5 K/uL   RBC 3.49 (*) 3.87 - 5.11 MIL/uL   Hemoglobin 10.0 (*) 12.0 - 15.0 g/dL   HCT 16.1 (*) 09.6 - 04.5 %   MCV 87.4  78.0 - 100.0 fL   MCH 28.7  26.0 - 34.0 pg   MCHC 32.8  30.0 - 36.0 g/dL   RDW 40.9  81.1 - 91.4 %   Platelets 213  150 - 400 K/uL  BASIC METABOLIC PANEL      Result Value Range   Sodium 139  135 - 145 mEq/L   Potassium 4.6  3.5 - 5.1 mEq/L   Chloride 111  96 - 112 mEq/L   CO2 15 (*) 19 - 32 mEq/L   Glucose, Bld 183 (*) 70 - 99 mg/dL   BUN 9  6 - 23 mg/dL   Creatinine, Ser 7.82  0.50 - 1.10 mg/dL   Calcium 8.1 (*) 8.4 - 10.5 mg/dL   GFR calc non Af Amer 76 (*) >90 mL/min   GFR calc Af Amer 88 (*) >90 mL/min  GLUCOSE, CAPILLARY       Result Value Range   Glucose-Capillary 118 (*) 70 - 99 mg/dL   Comment 1 Documented in Chart     Comment 2 Notify RN    GLUCOSE, CAPILLARY      Result Value Range   Glucose-Capillary 109 (*) 70 - 99 mg/dL   Comment 1 Documented in Chart     Comment 2 Notify RN    HEPARIN LEVEL (UNFRACTIONATED)      Result Value Range   Heparin Unfractionated 2.00 (*) 0.30 - 0.70 IU/mL  GLUCOSE, CAPILLARY      Result Value Range   Glucose-Capillary 93  70 - 99 mg/dL  PROTIME-INR      Result Value Range   Prothrombin Time 15.5 (*) 11.6 - 15.2 seconds   INR 1.25  0.00 - 1.49  HEPARIN LEVEL (UNFRACTIONATED)      Result Value Range   Heparin Unfractionated 1.14 (*) 0.30 - 0.70 IU/mL  BASIC METABOLIC PANEL      Result Value Range   Sodium 139  135 - 145 mEq/L   Potassium 3.5  3.5 - 5.1 mEq/L   Chloride 107  96 - 112 mEq/L   CO2 21  19 - 32 mEq/L   Glucose, Bld 83  70 - 99 mg/dL   BUN 8  6 - 23 mg/dL   Creatinine, Ser 9.56  0.50 - 1.10 mg/dL   Calcium 8.2 (*) 8.4 - 10.5 mg/dL   GFR calc non Af Amer 79 (*) >90 mL/min   GFR calc Af Amer >90  >90  mL/min  CBC      Result Value Range   WBC 4.4  4.0 - 10.5 K/uL   RBC 3.24 (*) 3.87 - 5.11 MIL/uL   Hemoglobin 9.4 (*) 12.0 - 15.0 g/dL   HCT 46.9 (*) 62.9 - 52.8 %   MCV 87.3  78.0 - 100.0 fL   MCH 29.0  26.0 - 34.0 pg   MCHC 33.2  30.0 - 36.0 g/dL   RDW 41.3  24.4 - 01.0 %   Platelets 180  150 - 400 K/uL  GLUCOSE, CAPILLARY      Result Value Range   Glucose-Capillary 92  70 - 99 mg/dL   Comment 1 Documented in Chart     Comment 2 Notify RN    GLUCOSE, CAPILLARY      Result Value Range   Glucose-Capillary 72  70 - 99 mg/dL   Comment 1 Documented in Chart     Comment 2 Notify RN     Labs reviewed. Seen for and discussed with Dr. Regenia Skeeter, [facility]  PA-C

## 2012-10-07 NOTE — Progress Notes (Addendum)
[facility] PROGRESS NOTE  [name] ZOX:[id number] DOB: 02/05/1924 DOA: 10/05/2012 PCP: Colette Ribas, MD  Brief narrative: 79 year old woman presented from [facility] with acute chest pain, back pain. Found to be hypothermic hypotensive, tachycardic and hypoxic on room air. CT of the chest revealed bilateral pulmonary emboli with high clot burden. Received a total of 6 L of normal saline on admission. Required vasopressor for 5 hours. Started on IV heparin.   Assessment: 1. Acute bilateral pulmonary embolism: Extensive thrombus at the right and left main pulmonary artery bifurcations extending into the lobar arteries on both sides. Hemodynamically stable. Echocardiogram could not comment on RV strain. Continue IV heparin. 2. Acute hypoxic respiratory failure: Stable, minimal oxygen requirement. Secondary to pulmonary embolism. Continue IV heparin and oxygen supplementation. 3. Right acute lower extremity DVT: IVC filter placement as anticoagulation may be difficult given history of SDH last month and GI bleed 2013. Also will offer more protection in the early period of heparinization. 4. Metabolic acidosis: Resolved. Secondary to lactic acidosis secondary to hypotension.  5. Abdominal tenderness generalized, worse in the epigastric region: Stable. Daughter reported pt has chronic abdominal pain due to diverticulitis.  6. Failure to thrive: Her niece reported that over the past month she has been declining in health in general she has not been taking adequate nutrition has been unable to participate with rehabilitation, she is mostly bed or chair bound, but fortunately he is able to communicate with her family although she has difficulty with her memory and periods of confusion. 7. History of left PCA stroke 08/2012: Was on Aggrenox prior to that admission. Deficits included moderate aphasia, mild right-sided weakness and right homonymous hemianopsia. Patient was placed on aspirin  and Plavix but this was discontinued secondary to fall and subdural hematoma 8. Subdural hematoma status post fall 08/2012: Repeat CT that admission demonstrated resolution. No new focal deficits noted. 9. History of erosive gastritis, hiatal hernia, GI bleed: PPI. GI bleed 08/2011. Thought to be diverticular exacerbated by NSAIDs. 10. Diabetes mellitus type 2: Well controlled. Sliding-scale insulin. 11. Normocytic anemia: Stable. 12. Dysphagia: Dysphasia 1. Pure.  Plan: 1. Continue IV heparin.  2. IVC filter placement. 3. Start Coumadin 4/9 if remains stable. 4. Monitor neurologic status.  Code Status: Limited code: No CPR, no intubation, no BiPAP. Yes to cardioversion or defibrillation for shockable rhythms, yes to pressors and antiarrhythmics IV Family Communication: discussed with niece by telephone (point of contact), updated on care Disposition Plan: Remain in ICU.  Brendia Sacks, MD  [facility]  Pager [phone] If 7PM-7AM, please contact night-coverage at [email or web address], password [facility] 10/07/2012, 7:57 AM  LOS: 2 days   Consultants:  None  Procedures:  2-D echocardiogram: Left ventricular ejection fraction probably normal. Regional wall motion could not be fully evaluated. Grade 1 diastolic dysfunction. Mild to moderate aortic stenosis. RV grossly normal in size. Function could not be evaluated.  HPI/Subjective: Lower extremity Dopplers revealed right-sided DVT. Case was discussed last night with family and IVC filter was recommended. Discussed with RN--noted issues. Patient has remained stable. Patient denies chest pain shortness of breath.  Objective: Filed Vitals:   10/07/12 0300 10/07/12 0400 10/07/12 0500 10/07/12 0600  BP: 140/61 97/43 116/56 128/54  Pulse: 69 68 70 70  Temp: 98.7 F (37.1 C) 98.8 F (37.1 C) 98.8 F (37.1 C) 99 F (37.2 C)  TempSrc:      Resp: 19 16 15 17   Height:      Weight:   65.1 kg (  143 lb 8.3 oz)   SpO2: 100% 100% 95% 100%     Intake/Output Summary (Last 24 hours) at 10/07/12 0757 Last data filed at 10/07/12 0700  Gross per 24 hour  Intake  834.5 ml  Output    250 ml  Net  584.5 ml   Filed Weights   10/05/12 2235 10/06/12 0245 10/07/12 0500  Weight: 54.432 kg (120 lb) 57 kg (125 lb 10.6 oz) 65.1 kg (143 lb 8.3 oz)    Exam:  General:  Examined in the ICU. Appears calm and comfortable.  Eyes: Pupils, irises and lids appear normal ENT: grossly normal hearing, lips & tongue.  Cardiovascular: RRR, no m/r/g. No LE edema. Telemetry: SR, no arrhythmias  Respiratory: CTA bilaterally, no w/r/r. Normal respiratory effort. Abdomen: soft, nontender, nondistended. Musculoskeletal: Moves all extremities to command. Exam grossly nonfocal. Psychiatric: grossly normal mood and affect, speech fluent and appropriate.  Neurologic: As above. Cranial nerves appear grossly intact.  Data Reviewed: Basic Metabolic Panel:  Recent Labs Lab 10/05/12 2027 10/06/12 0430 10/07/12 0244  NA 134* 139 139  K 4.4 4.6 3.5  CL 104 111 107  CO2 15* 15* 21  GLUCOSE 266* 183* 83  BUN 10 9 8   CREATININE 0.91 0.70 0.63  CALCIUM 8.9 8.1* 8.2*   Liver Function Tests:  Recent Labs Lab 10/05/12 2027  AST 72*  ALT 27  ALKPHOS 57  BILITOT 0.3  PROT 5.9*  ALBUMIN 2.8*   CBC:  Recent Labs Lab 10/05/12 2027 10/06/12 0430 10/07/12 0244  WBC 8.3 7.0 4.4  HGB 10.6* 10.0* 9.4*  HCT 32.6* 30.5* 28.3*  MCV 88.8 87.4 87.3  PLT 198 213 180   Cardiac Enzymes:  Recent Labs Lab 10/05/12 2027  TROPONINI <0.30     Recent Labs  10/05/12 2027  PROBNP 332.0   CBG:  Recent Labs Lab 10/06/12 0754 10/06/12 1152 10/06/12 1749 10/06/12 2145 10/07/12 0745  GLUCAP 118* 109* 93 92 72    Studies: Ct Angio Chest Pe W/cm &/or Wo Cm  10/05/2012  **ADDENDUM** CREATED: 10/05/2012 22:12:10  Critical Value/emergent results were called by telephone at the time of interpretation on 10/05/2012 at 10:10 p.m. to Dr. Estell Harpin, who  verbally acknowledged these results.  **END ADDENDUM** SIGNED BY: Chauncey Fischer, M.D.   10/05/2012  *RADIOLOGY REPORT*  Clinical Data:  Chest pain.  Lower mid to upper back pain.  CT ANGIOGRAPHY CHEST AND ABDOMEN  Technique:  Multidetector CT imaging of the chest and abdomen was performed using the standard protocol during bolus administration of intravenous contrast.  Multiplanar CT image reconstructions including MIPs were obtained to evaluate the vascular anatomy.  Contrast: OMNIPAQUE IOHEXOL 350 MG/ML SOLN  Comparison:  Chest x-ray 10/05/2012.  CT abdomen and pelvis 05/30/2009.  CTA CHEST  Findings:  Large pulmonary emboli are evident at the bifurcations of the right than the left main pulmonary artery extending into the lobar arteries bilaterally.  Thrombotic burden is symmetric.  The thoracic aorta is tortuous.  Atherosclerotic calcifications are present within the thoracic aorta.  A standard three-vessel arch configuration is present.  There is no significant aneurysm or dissection.  There is no significant stenosis of the origins of the great vessels.  The heart size is normal.  No significant pleural or pericardial effusion is evident.  Mild bronchiectasis is evident bilaterally.  Minimal dependent atelectasis is worse on the right.  No focal nodule, mass, or airspace disease is present.  The bone windows are unremarkable. The noncontrast study  demonstrates no displaced calcifications.   Review of the MIP images confirms the above findings.  IMPRESSION:  1.  Prominent bilateral pulmonary emboli with extensive thrombus at the right and left main pulmonary artery bifurcations extending into the lobar arteries on both sides. 2.  Atherosclerotic changes in the aorta without evidence for acute dissection or aneurysm. 3.  Standard three-vessel arch configuration.  CTA ABDOMEN  Findings:  Atherosclerotic calcifications and mural plaque present within the abdominal aorta without aneurysm or  dissection.  The iliac arteries are normal size but with extensive atherosclerotic calcifications as well.  The celiac artery and superior mesenteric artery are patent.  The renal artery ostia are patent.  The inferior mesenteric artery is opacified.  The liver and spleen are within normal limits.  The stomach, duodenum, and pancreas are normal.  The common bile duct is within normal limits following cholecystectomy.  Adrenal glands are normal bilaterally.  The kidneys and ureters are unremarkable.  A Foley catheter is present within the urinary bladder.  The patient is status post hysterectomy.  The ovaries are not clearly visualized and may be surgically absent.  The rectosigmoid colon is mostly collapsed.  Scattered diverticular changes are present without focal inflammation to suggest acute diverticulitis.  The ascending colon is within normal limits.  The patient is status post cholecystectomy.  The small bowel is unremarkable.  No significant adenopathy or free fluid is present.  Bone windows demonstrates degenerative disc disease at L4-5 and multilevel facet degenerative change.  No focal lytic or blastic lesions are evident   Review of the MIP images confirms the above findings.  IMPRESSION:  1.  Atherosclerotic changes throughout the abdominal aorta branch vessels without aneurysm. 2.  Multilevel degenerative changes in the lumbar spine.   Original Report Authenticated By: Marin Roberts, M.D.    US Venous Img Lower Bilateral  10/06/2012  *RADIOLOGY REPORT*  Clinical Data: Acute bilateral pulmonary embolism.  VENOUS DUPLEX ULTRASOUND OF BILATERAL LOWER EXTREMITIES  Technique:  Gray-scale sonography with graded compression, as well as color Doppler and duplex ultrasound, were performed to evaluate the deep venous system of both lower extremities from the level of the common femoral vein through the popliteal and proximal calf veins.  Spectral Doppler was utilized to evaluate flow at rest and with  distal augmentation maneuvers.  Comparison:  None.  Findings:  Right lower extremity:  There is evidence of occlusive thrombus in the distal thigh segment of the femoral vein.  Contiguous thrombus extends into the proximal popliteal vein where some of the clot is nonocclusive.  No other DVT is identified on the right.  Left lower extremity:  No evidence of DVT in the left lower extremity.  IMPRESSION: Right lower extremity DVT at the level of the femoral vein in the distal thigh and proximal popliteal vein.  No DVT identified in the left lower extremity.   Original Report Authenticated By: Irish Lack, M.D.    Dg Chest Port 1 View  10/05/2012  *RADIOLOGY REPORT*  Clinical Data: Chest pain  PORTABLE CHEST - 1 VIEW  Comparison: 09/02/2012  Findings: Low lung volumes are present, causing crowding of the pulmonary vasculature.  Atherosclerotic calcification of the aortic arch noted. No cardiomegaly.  The patient is rotated to the left on today's exam, resulting in reduced diagnostic sensitivity and specificity.   The lungs appear clear.  IMPRESSION:  1.  No acute findings. 2.  Low lung volumes. 3.  Atherosclerosis.   Original Report Authenticated By: Gaylyn Rong, M.D.  Ct Cta Abd/pel W/cm &/or W/o Cm  10/05/2012  **ADDENDUM** CREATED: 10/05/2012 22:12:10  Critical Value/emergent results were called by telephone at the time of interpretation on 10/05/2012 at 10:10 p.m. to Dr. Estell Harpin, who verbally acknowledged these results.  **END ADDENDUM** SIGNED BY: Chauncey Fischer, M.D.   10/05/2012  *RADIOLOGY REPORT*  Clinical Data:  Chest pain.  Lower mid to upper back pain.  CT ANGIOGRAPHY CHEST AND ABDOMEN  Technique:  Multidetector CT imaging of the chest and abdomen was performed using the standard protocol during bolus administration of intravenous contrast.  Multiplanar CT image reconstructions including MIPs were obtained to evaluate the vascular anatomy.  Contrast: OMNIPAQUE IOHEXOL 350 MG/ML SOLN   Comparison:  Chest x-ray 10/05/2012.  CT abdomen and pelvis 05/30/2009.  CTA CHEST  Findings:  Large pulmonary emboli are evident at the bifurcations of the right than the left main pulmonary artery extending into the lobar arteries bilaterally.  Thrombotic burden is symmetric.  The thoracic aorta is tortuous.  Atherosclerotic calcifications are present within the thoracic aorta.  A standard three-vessel arch configuration is present.  There is no significant aneurysm or dissection.  There is no significant stenosis of the origins of the great vessels.  The heart size is normal.  No significant pleural or pericardial effusion is evident.  Mild bronchiectasis is evident bilaterally.  Minimal dependent atelectasis is worse on the right.  No focal nodule, mass, or airspace disease is present.  The bone windows are unremarkable. The noncontrast study demonstrates no displaced calcifications.   Review of the MIP images confirms the above findings.  IMPRESSION:  1.  Prominent bilateral pulmonary emboli with extensive thrombus at the right and left main pulmonary artery bifurcations extending into the lobar arteries on both sides. 2.  Atherosclerotic changes in the aorta without evidence for acute dissection or aneurysm. 3.  Standard three-vessel arch configuration.  CTA ABDOMEN  Findings:  Atherosclerotic calcifications and mural plaque present within the abdominal aorta without aneurysm or dissection.  The iliac arteries are normal size but with extensive atherosclerotic calcifications as well.  The celiac artery and superior mesenteric artery are patent.  The renal artery ostia are patent.  The inferior mesenteric artery is opacified.  The liver and spleen are within normal limits.  The stomach, duodenum, and pancreas are normal.  The common bile duct is within normal limits following cholecystectomy.  Adrenal glands are normal bilaterally.  The kidneys and ureters are unremarkable.  A Foley catheter is present within  the urinary bladder.  The patient is status post hysterectomy.  The ovaries are not clearly visualized and may be surgically absent.  The rectosigmoid colon is mostly collapsed.  Scattered diverticular changes are present without focal inflammation to suggest acute diverticulitis.  The ascending colon is within normal limits.  The patient is status post cholecystectomy.  The small bowel is unremarkable.  No significant adenopathy or free fluid is present.  Bone windows demonstrates degenerative disc disease at L4-5 and multilevel facet degenerative change.  No focal lytic or blastic lesions are evident   Review of the MIP images confirms the above findings.  IMPRESSION:  1.  Atherosclerotic changes throughout the abdominal aorta branch vessels without aneurysm. 2.  Multilevel degenerative changes in the lumbar spine.   Original Report Authenticated By: Marin Roberts, M.D.     Scheduled Meds: . insulin aspart  0-15 Units Subcutaneous TID WC  . pantoprazole  40 mg Oral BID  . sodium chloride  3 mL Intravenous  Q12H   Continuous Infusions: . heparin 700 Units/hr (10/07/12 0407)    Principal Problem:   Acute pulmonary embolism Active Problems:   Anemia   Type II or unspecified type diabetes mellitus with neurological manifestations, not stated as uncontrolled(250.60)   CVA (cerebral infarction)   Subdural hematoma   Homonymous hemianopia   Dysphagia, pharyngoesophageal phase   Hypotension, unspecified   Acute respiratory failure with hypoxia   Metabolic acidosis   Adult failure to thrive   Brendia Sacks, MD  [facility] Pager [phone] If 7PM-7AM, please contact night-coverage at [email or web address], password [facility] 10/07/2012, 7:57 AM  LOS: 2 days   Time spent: 30 minutes

## 2012-10-07 NOTE — ED Notes (Signed)
C/O buttock pain from lying on hard table.  Instructed would receive pain med shortly.

## 2012-10-07 NOTE — Progress Notes (Signed)
Pt being transfer to [facility] for placement of ivc filter. Transferred to [facility] via [facility].

## 2012-10-07 NOTE — ED Notes (Signed)
Awaiting MD 

## 2012-10-07 NOTE — ED Notes (Signed)
CBG 68 Ginger ale given.  Tolerated procedure well.  VSS.  Buttock pain relieved.

## 2012-10-07 NOTE — Progress Notes (Signed)
INITIAL NUTRITION ASSESSMENT  DOCUMENTATION CODES Per approved criteria  -Non-severe (moderate) malnutrition in the context of chronic illness   INTERVENTION: Recommend obtain re-weight and When diet is advanced add:  ProStat 30 ml TID (each 30 ml provides 100 kcal, 15 gr protein) Resource Breeze po TID, each supplement provides 250 kcal and 9 grams of protein. If unable to advance diet recommend initiate alternate nutrition support measures due to pt malnourished state.  NUTRITION DIAGNOSIS: Malnutrition related to inadequate oral intake as evidenced by average meal intake <75% > 1 month, wt loss 8% < 3 months, mild-moderate depletion of muscle, and fat .   Goal: Pt to meet >/= 90% of their estimated nutrition needs; and optimize protein intake   Monitor:  Skin assessments,Po intake, labs and wt trends  Reason for Assessment: Low Braden Score=12  79 y.o. female  Admitting Dx: Acute pulmonary embolism  ASSESSMENT: RD drawn to pt chart due to low Braden score.  Persistent poor po intake (food and beverages) over past month since d/c from hospital 08/21/12 where she was tx for acute left CVA. Her diet had progressed to Mechanical Soft Consistent CHO; while followed by ST.  Weight on admission to [facility]-131#. At discharge 10#, 8% wt loss < 3 months observed. This nice lady is at risk for skin breakdown given her Braden Score and hx of inadequate oral intake.  Height: Ht Readings from Last 1 Encounters:  10/06/12 5\' 6"  (1.676 m)    Weight: Wt Readings from Last 1 Encounters:  10/07/12 143 lb 8.3 oz (65.1 kg)    Ideal Body Weight: 130# (59 kg)  % Ideal Body Weight: 110%  Wt Readings from Last 10 Encounters:  10/07/12 143 lb 8.3 oz (65.1 kg)  08/19/12 147 lb 11.3 oz (67 kg)  08/28/11 132 lb 14.4 oz (60.283 kg)  08/28/11 132 lb 14.4 oz (60.283 kg)  08/28/11 132 lb 14.4 oz (60.283 kg)    Usual Body Weight: 120.8# on 10/03/12   BMI:  Body mass index is 23.18  kg/(m^2).WNL  Estimated Nutritional Needs: Kcal: 1400-1600 Protein: 72-83 gr Fluid: >1600 ml/day  Skin: No issues noted  Diet Order: NPO  EDUCATION NEEDS: -Education needs addressed   Intake/Output Summary (Last 24 hours) at 10/07/12 1105 Last data filed at 10/07/12 0813  Gross per 24 hour  Intake  853.5 ml  Output    100 ml  Net  753.5 ml    Last BM: 10/06/12  Labs:   Recent Labs Lab 10/05/12 2027 10/06/12 0430 10/07/12 0244  NA 134* 139 139  K 4.4 4.6 3.5  CL 104 111 107  CO2 15* 15* 21  BUN 10 9 8   CREATININE 0.91 0.70 0.63  CALCIUM 8.9 8.1* 8.2*  GLUCOSE 266* 183* 83    CBG (last 3)   Recent Labs  10/06/12 1749 10/06/12 2145 10/07/12 0745  GLUCAP 93 92 72    Scheduled Meds: . insulin aspart  0-15 Units Subcutaneous TID WC  . pantoprazole  40 mg Oral BID  . sodium chloride  3 mL Intravenous Q12H    Continuous Infusions: . dextrose 5 % and 0.45% NaCl 50 mL/hr (10/07/12 0813)  . heparin 700 Units/hr (10/07/12 1008)    Past Medical History  Diagnosis Date  . Stroke 2000 and 2002  . Hypertension   . Erosive gastritis 08/29/2011    Per EGD  . Hiatal hernia 08/29/2011    Per EGD  . Diverticulosis 08/30/2011    Per colonoscopy  .  Peripheral neuropathy 08/30/2011  . EKG abnormalities February 2013    Lateral inverted T waves  . 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 Subdural hematoma 08/2012    Status post fall  . Dysphagia, pharyngoesophageal phase 08/16/2012  . Type II or unspecified type diabetes mellitus with neurological manifestations, not stated as uncontrolled(250.60)   . Acute left PCA stroke 08/16/2012    Past Surgical History  Procedure Laterality Date  . Abdominal hysterectomy    . Tonsillectomy    . Cholecystectomy    . Appendectomy    . Esophagogastroduodenoscopy  08/28/2011    Procedure: ESOPHAGOGASTRODUODENOSCOPY (EGD);  Surgeon: Malissa Hippo, MD;  [facility]: [facility];  Service: Endoscopy;  Laterality: N/A;  .  Colonoscopy  08/29/2011    Procedure: COLONOSCOPY;  Surgeon: Malissa Hippo, MD;  Location: [facility];  Service: Endoscopy;  Laterality: N/A;    Royann Shivers MS,RD,LDN,CSG Office: [phone] Pager: [phone]

## 2012-10-07 NOTE — Clinical Social Work Note (Signed)
Family decided not to pay bed hold at [facility] so bed may not be available.  Will fax patient out to other facilities, niece E Courts informed and agreeable.  Santa Genera, LCSW Clinical Social Worker [phone])

## 2012-10-08 DIAGNOSIS — E876 Hypokalemia: Secondary | ICD-10-CM

## 2012-10-08 LAB — GLUCOSE, CAPILLARY
Glucose-Capillary: 89 mg/dL (ref 70–99)
Glucose-Capillary: 94 mg/dL (ref 70–99)
Glucose-Capillary: 111 mg/dL — ABNORMAL HIGH (ref 70–99)

## 2012-10-08 LAB — BASIC METABOLIC PANEL
BUN: 4 mg/dL — ABNORMAL LOW (ref 6–23)
CO2: 23 mEq/L (ref 19–32)
Chloride: 102 mEq/L (ref 96–112)
Glucose, Bld: 93 mg/dL (ref 70–99)
Potassium: 2.7 mEq/L — CL (ref 3.5–5.1)

## 2012-10-08 LAB — CBC
HCT: 29.2 % — ABNORMAL LOW (ref 36.0–46.0)
Hemoglobin: 9.7 g/dL — ABNORMAL LOW (ref 12.0–15.0)
MCH: 29.2 pg (ref 26.0–34.0)
MCHC: 33.2 g/dL (ref 30.0–36.0)

## 2012-10-08 MED ORDER — WARFARIN SODIUM 2 MG PO TABS
4.0000 mg | ORAL_TABLET | Freq: Once | ORAL | Status: AC
  Administered 2012-10-08: 4 mg via ORAL
  Filled 2012-10-08: qty 2

## 2012-10-08 MED ORDER — MAGNESIUM SULFATE 40 MG/ML IJ SOLN
4.0000 g | Freq: Once | INTRAMUSCULAR | Status: AC
  Administered 2012-10-08: 4 g via INTRAVENOUS
  Filled 2012-10-08: qty 100

## 2012-10-08 MED ORDER — WARFARIN - PHARMACIST DOSING INPATIENT
Status: DC
  Administered 2012-10-08: 16:00:00

## 2012-10-08 MED ORDER — SODIUM CHLORIDE 0.45 % IV SOLN
INTRAVENOUS | Status: DC
  Administered 2012-10-08 – 2012-10-11 (×2): via INTRAVENOUS
  Administered 2012-10-13: 20 mL/h via INTRAVENOUS

## 2012-10-08 MED ORDER — POTASSIUM CHLORIDE 10 MEQ/100ML IV SOLN
10.0000 meq | INTRAVENOUS | Status: AC
  Administered 2012-10-08 (×4): 10 meq via INTRAVENOUS
  Filled 2012-10-08: qty 400

## 2012-10-08 NOTE — Progress Notes (Addendum)
PT IS BEING TRANSFERRED TO ROOM 314 ACROSS FROM 300 NURSES STATION D/T BEING HIGH FALL RISK. PT ALERT. ORIENTED TO PERSON.  HR 70'S TO 80'S IN NSR. IV PATENT. FOLEY CATHETER PATENT.TRANSFER REPORT CALLED TO CINDY RN ON 300. PT TO BE TRANSPORTED IN HER BED.

## 2012-10-08 NOTE — Progress Notes (Addendum)
[facility] PROGRESS NOTE  [name] WUJ:[id number] DOB: 02/05/1924 DOA: 10/05/2012 PCP: Colette Ribas, MD  Brief narrative: 79 year old woman presented from [facility] with acute chest pain, back pain. Found to be hypothermic hypotensive, tachycardic and hypoxic on room air. CT of the chest revealed bilateral pulmonary emboli with high clot burden. Received a total of 6 L of normal saline on admission. Required vasopressor for 5 hours. Started on IV heparin.   Assessment/Plan: 1. Acute bilateral pulmonary embolism: Extensive thrombus at the right and left main pulmonary artery bifurcations extending into the lobar arteries on both sides. Hemodynamically stable. Echocardiogram could not comment on RV strain. Continue IV heparin. She is status post IVC filter placement on 10/07/12. With the extensive clot burden, I do feel that she does need to be on some sort of anticoagulation. Of course, with her recent subdural hematoma and propensity for falls , this will certainly be challenging. I discussed this with her niece [name] and explained the risk of starting anticoagulation. Certainly she will be at higher risk for bleeding including intracranial bleeding with anticoagulation, but the risk of morbidity/mortality due to an under treated, very large pulmonary embolus appears to be greater. We will cautiously start the patient on Coumadin. I feel this would be a better option than the newer anticoagulants such as Xarelto, since Coumadin can be easily reversed if necessary. It may also be beneficial to place the patient on a short course of anticoagulation such as 3 months with repeat CT of the chest in 3 months to evaluate for residual thrombus. 2. Acute hypoxic respiratory failure: Stable, minimal oxygen requirement. Secondary to pulmonary embolism. Continue IV heparin and oxygen supplementation. 3. Right acute lower extremity DVT: IVC filter placement as anticoagulation may be difficult  given history of SDH last month and GI bleed 2013. Also will offer more protection in the early period of heparinization. 4. Metabolic acidosis: Resolved. Secondary to lactic acidosis secondary to hypotension.  5. Abdominal tenderness generalized, worse in the epigastric region: Stable. Daughter reported pt has chronic abdominal pain due to diverticulitis.  6. Failure to thrive: Initially her niece reported that the patient was declining at the nursing home. Her by mouth intake has been minimal and she was not participating with physical therapy. She is essentially bed/chair bound. On further conversation today, her niece now reports that she feels the patient was doing better in therapy. She reports that regarding her diet, she did have good days and bad days. Regarding her functional status, she was starting to ambulate with a walker. Her niece wishes her to continue with physical therapy at a nursing home. 7. History of left PCA stroke 08/2012: Was on Aggrenox prior to that admission. Deficits included moderate aphasia, mild right-sided weakness and right homonymous hemianopsia. Patient was placed on aspirin and Plavix but this was discontinued secondary to fall and subdural hematoma 8. Subdural hematoma status post fall 08/2012: Repeat CT that admission demonstrated resolution. No new focal deficits noted. 9. History of erosive gastritis, hiatal hernia, GI bleed: PPI. GI bleed 08/2011. Thought to be diverticular exacerbated by NSAIDs. 10. Diabetes mellitus type 2: Well controlled. Sliding-scale insulin. 11. Normocytic anemia: Stable. 12. Dysphagia: Dysphasia 1. Pure.  Code Status: Limited code: No CPR, no intubation, no BiPAP. Yes to cardioversion or defibrillation for shockable rhythms, yes to pressors and antiarrhythmics IV Family Communication: discussed with niece by telephone (point of contact), updated on care Disposition Plan: Remain in ICU.  Erick Blinks, MD  [facility]  Pager  [phone] If 7PM-7AM, please contact night-coverage at [email or web address], password [facility] 10/08/2012, 11:52 AM  LOS: 3 days   Consultants:  None  Procedures:  2-D echocardiogram: Left ventricular ejection fraction probably normal. Regional wall motion could not be fully evaluated. Grade 1 diastolic dysfunction. Mild to moderate aortic stenosis. RV grossly normal in size. Function could not be evaluated.  HPI/Subjective: Patient seen in the ICU today. Denies any shortness of breath or chest pain. A breakfast this morning. Does not have any complaints.  Objective: Filed Vitals:   10/08/12 0846 10/08/12 0900 10/08/12 1000 10/08/12 1139  BP:  128/47 144/52 128/45  Pulse:    68  Temp: 98.4 F (36.9 C)     TempSrc: Oral     Resp:  19 17   Height:      Weight:      SpO2:    91%    Intake/Output Summary (Last 24 hours) at 10/08/12 1152 Last data filed at 10/08/12 1020  Gross per 24 hour  Intake   1991 ml  Output   1500 ml  Net    491 ml   Filed Weights   10/05/12 2235 10/06/12 0245 10/07/12 0500  Weight: 54.432 kg (120 lb) 57 kg (125 lb 10.6 oz) 65.1 kg (143 lb 8.3 oz)    Exam:  General:  Examined in the ICU. Appears calm and comfortable.  Eyes: Pupils, irises and lids appear normal ENT: grossly normal hearing, lips & tongue.  Cardiovascular: RRR, no m/r/g. No LE edema. Telemetry: SR, no arrhythmias  Respiratory: CTA bilaterally, no w/r/r. Normal respiratory effort. Abdomen: soft, nontender, nondistended. Musculoskeletal: Moves all extremities to command. Exam grossly nonfocal. Psychiatric: grossly normal mood and affect, speech fluent and appropriate.  Neurologic: As above. Cranial nerves appear grossly intact.  Data Reviewed: Basic Metabolic Panel:  Recent Labs Lab 10/05/12 2027 10/06/12 0430 10/07/12 0244 10/08/12 0425 10/08/12 0658  NA 134* 139 139 136  --   K 4.4 4.6 3.5 2.7*  --   CL 104 111 107 102  --   CO2 15* 15* 21 23  --   GLUCOSE 266* 183* 83 93  --    BUN 10 9 8  4*  --   CREATININE 0.91 0.70 0.63 0.54  --   CALCIUM 8.9 8.1* 8.2* 8.4  --   MG  --   --   --   --  1.2*   Liver Function Tests:  Recent Labs Lab 10/05/12 2027  AST 72*  ALT 27  ALKPHOS 57  BILITOT 0.3  PROT 5.9*  ALBUMIN 2.8*   CBC:  Recent Labs Lab 10/05/12 2027 10/06/12 0430 10/07/12 0244 10/08/12 0425  WBC 8.3 7.0 4.4 5.8  HGB 10.6* 10.0* 9.4* 9.7*  HCT 32.6* 30.5* 28.3* 29.2*  MCV 88.8 87.4 87.3 88.0  PLT 198 213 180 203   Cardiac Enzymes:  Recent Labs Lab 10/05/12 2027  TROPONINI <0.30     Recent Labs  10/05/12 2027  PROBNP 332.0   CBG:  Recent Labs Lab 10/07/12 1659 10/07/12 1956 10/07/12 2135 10/08/12 0812 10/08/12 1128  GLUCAP 75 89 94 101* 111*    Studies: Ir Ivc Filter Plmt / S&i /img Guid/mod Sed  10/07/2012  *RADIOLOGY REPORT*  Clinical Data:  Acute bilateral pulmonary embolism and residual right lower extremity DVT.  The patient is a poor candidate for long-term anticoagulation due to age, history of subdural hematoma and prior GI bleed.  1.  ULTRASOUND GUIDANCE FOR VASCULAR ACCESS OF THE  RIGHT INTERNAL JUGULAR VEIN. 2.  IVC VENOGRAM 3.  PERCUTANEOUS IVC FILTER PLACEMENT  Sedation:  12.5 mcg IV Fentanyl.  Total Moderate Sedation Time:  15 minutes.  Contrast:  35 ml Omnipaque-300  Fluoroscopy Time: 1.7 minutes.  Procedure:  The procedure, risks, benefits, and alternatives were explained to the patient's daughter.  Questions regarding the procedure were encouraged and answered.  The patient's daughter consents to the procedure.  The right neck was prepped with Betadine in a sterile fashion, and a sterile drape was applied covering the operative field.  A sterile gown and sterile gloves were used for the procedure.  Local anesthesia was provided with 1% Lidocaine.  Under direct ultrasound guidance, a 21 gauge needle was advanced into the right internal jugular vein with ultrasound image documentation performed.  After securing access  with a micropuncture dilator, a guidewire was advanced into the inferior vena cava.  A deployment sheath was advanced over the guidewire. This was utilized to perform IVC venography.  The deployment sheath was further positioned in an appropriate location for filter deployment.  A Bard Denali IVC filter was then advanced in the sheath.  This was then fully deployed in the infrarenal IVC.  Final filter position was confirmed with a fluoroscopic spot image.  Contrast injection was also performed through the sheath under fluoroscopy to confirm patency of the IVC at the level of the filter.  After the procedure the sheath was removed and hemostasis obtained with manual compression.  Complications: None  Findings:  IVC venography demonstrates a normal caliber IVC with no evidence of thrombus.  Renal veins are identified bilaterally.  The IVC filter was successfully positioned below the level of the renal veins and is appropriately oriented.  This IVC filter has both permanent and retrievable indications.  IMPRESSION:  Placement of percutaneous IVC filter in infrarenal IVC.  IVC venogram shows no evidence of IVC thrombus and normal caliber of the inferior vena cava.  This filter does have both permanent and retrievable indications.   Original Report Authenticated By: Irish Lack, M.D.    US Venous Img Lower Bilateral  10/06/2012  *RADIOLOGY REPORT*  Clinical Data: Acute bilateral pulmonary embolism.  VENOUS DUPLEX ULTRASOUND OF BILATERAL LOWER EXTREMITIES  Technique:  Gray-scale sonography with graded compression, as well as color Doppler and duplex ultrasound, were performed to evaluate the deep venous system of both lower extremities from the level of the common femoral vein through the popliteal and proximal calf veins.  Spectral Doppler was utilized to evaluate flow at rest and with distal augmentation maneuvers.  Comparison:  None.  Findings:  Right lower extremity:  There is evidence of occlusive thrombus in  the distal thigh segment of the femoral vein.  Contiguous thrombus extends into the proximal popliteal vein where some of the clot is nonocclusive.  No other DVT is identified on the right.  Left lower extremity:  No evidence of DVT in the left lower extremity.  IMPRESSION: Right lower extremity DVT at the level of the femoral vein in the distal thigh and proximal popliteal vein.  No DVT identified in the left lower extremity.   Original Report Authenticated By: Irish Lack, M.D.     Scheduled Meds: . feeding supplement  30 mL Oral TID WC  . feeding supplement  1 Container Oral TID BM  . insulin aspart  0-15 Units Subcutaneous TID WC  . pantoprazole  40 mg Oral BID  . potassium chloride  10 mEq Intravenous Q1 Hr x 4  .  sodium chloride  3 mL Intravenous Q12H   Continuous Infusions: . sodium chloride    . heparin 700 Units/hr (10/08/12 1001)    Principal Problem:   Acute pulmonary embolism Active Problems:   Anemia   Type II or unspecified type diabetes mellitus with neurological manifestations, not stated as uncontrolled(250.60)   CVA (cerebral infarction)   Subdural hematoma   Homonymous hemianopia   Dysphagia, pharyngoesophageal phase   Hypotension, unspecified   Acute respiratory failure with hypoxia   Metabolic acidosis   Adult failure to thrive   DVT, lower extremity   Erick Blinks, MD  [facility] Pager [phone] If 7PM-7AM, please contact night-coverage at [email or web address], password [facility] 10/08/2012, 11:52 AM  LOS: 3 days   Time spent: 30 minutes

## 2012-10-08 NOTE — Progress Notes (Signed)
ANTICOAGULATION CONSULT NOTE  Pharmacy Consult for Heparin Indication: pulmonary embolus & DVT  No Known Allergies  Patient Measurements: Height: 5\' 6"  (167.6 cm) Weight: 143 lb 8.3 oz (65.1 kg) IBW/kg (Calculated) : 59.3 Heparin Dosing Weight: 54.4kg  Vital Signs: Temp: 97.8 F (36.6 C) (04/09 0400) Temp src: Oral (04/09 0400) BP: 82/60 mmHg (04/09 0600) Pulse Rate: 69 (04/09 0500)  Labs:  Recent Labs  10/05/12 2027 10/06/12 0430  10/07/12 0244 10/07/12 1421 10/07/12 2043 10/08/12 0425  HGB 10.6* 10.0*  --  9.4*  --   --  9.7*  HCT 32.6* 30.5*  --  28.3*  --   --  29.2*  PLT 198 213  --  180  --   --  203  APTT 38*  --   --   --   --   --   --   LABPROT 16.7*  --   --  15.5*  --   --   --   INR 1.39  --   --  1.25  --   --   --   HEPARINUNFRC  --   --   < > 1.14* 0.69 0.57 0.52  CREATININE 0.91 0.70  --  0.63  --   --  0.54  TROPONINI <0.30  --   --   --   --   --   --   < > = values in this interval not displayed. Estimated Creatinine Clearance: 47.3 ml/min (by C-G formula based on Cr of 0.54).  Medical History: Past Medical History  Diagnosis Date  . Stroke 2000 and 2002  . Hypertension   . Erosive gastritis 08/29/2011    Per EGD  . Hiatal hernia 08/29/2011    Per EGD  . Diverticulosis 08/30/2011    Per colonoscopy  . Peripheral neuropathy 08/30/2011  . EKG abnormalities February 2013    Lateral inverted T waves  . 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 Subdural hematoma 08/2012    Status post fall  . Dysphagia, pharyngoesophageal phase 08/16/2012  . Type II or unspecified type diabetes mellitus with neurological manifestations, not stated as uncontrolled(250.60)   . Acute left PCA stroke 08/16/2012   Medications: Scheduled:  . feeding supplement  30 mL Oral TID WC  . feeding supplement  1 Container Oral TID BM  . insulin aspart  0-15 Units Subcutaneous TID WC  . pantoprazole  40 mg Oral BID  . sodium chloride  3 mL Intravenous Q12H   Assessment: Patient  with (+)PE per CT report and was started on IV Heparin.  Pt went to [facility] on 4/8 and had IVC filter placed.  Heparin level is within therapeutic range.  Pt is now hemodynamically stable per MD eval.  H/H is stable.  Goal of Therapy:  Heparin level 0.3-0.7 units/ml Monitor platelets by anticoagulation protocol: Yes   Plan:  Continue Heparin at 700 units/hr  Check heparin level daily CBC daily while on Heparin Duration of heparin Rx per MD  Valrie Hart A 10/08/2012,7:32 AM

## 2012-10-08 NOTE — Plan of Care (Signed)
Problem: ICU Phase Progression Outcomes Goal: O2 sats trending toward baseline Outcome: Progressing Nasal cannula 2 liters saturation 95% Goal: Dyspnea controlled at rest Outcome: Progressing Continues weakness and dyspnea with exertion

## 2012-10-08 NOTE — Clinical Social Work Placement (Signed)
    Clinical Social Work Department CLINICAL SOCIAL WORK PLACEMENT NOTE 10/14/2012  Patient:  [name], [name]  Account Number:  [id number] Admit date:  10/05/2012  Clinical Social Worker:  Santa Genera, CLINICAL SOCIAL WORKER  Date/time:  10/08/2012 11:00 AM  Clinical Social Work is seeking post-discharge placement for this patient at the following level of care:   SKILLED NURSING   (*CSW will update this form in Epic as items are completed)   10/08/2012  Patient/family provided with [facility] Department of Clinical Social Work's list of facilities offering this level of care within the geographic area requested by the patient (or if unable, by the patient's family).  10/08/2012  Patient/family informed of their freedom to choose among providers that offer the needed level of care, that participate in Medicare, Medicaid or managed care program needed by the patient, have an available bed and are willing to accept the patient.  10/08/2012  Patient/family informed of [facility]' ownership interest in [facility], as well as of the fact that they are under no obligation to receive care at this facility.  PASARR submitted to EDS on  PASARR number received from EDS on   FL2 transmitted to all facilities in geographic area requested by pt/family on  10/08/2012 FL2 transmitted to all facilities within larger geographic area on   Patient informed that his/her managed care company has contracts with or will negotiate with  certain facilities, including the following:     Patient/family informed of bed offers received:  10/08/2012 Patient chooses bed at [facility] [place] Physician recommends and patient chooses bed at  [facility] [place]  Patient to be transferred to [facility] [place] on  10/14/2012 Patient to be transferred to facility by [facility]  The following physician request were entered in Epic:   Additional Comments: Patient has preexisting  PASARR.  Patient previously placed at [facility] but family did not want to pay bed hold.  [facility] declined as they do not anticipate having open bed when patient ready for discharge.  Family aware and chose alternative SNF, [facility].  EMS transport arranged due to need for immobilization of intramuscular hemorrhage.  Santa Genera, LCSW Clinical Social Worker [phone])

## 2012-10-08 NOTE — Progress Notes (Signed)
ANTICOAGULATION CONSULT NOTE  Pharmacy Consult for Heparin & initiate Warfarin Indication: pulmonary embolus & DVT  No Known Allergies  Patient Measurements: Height: 5\' 6"  (167.6 cm) Weight: 143 lb 8.3 oz (65.1 kg) IBW/kg (Calculated) : 59.3 Heparin Dosing Weight: 54.4kg  Vital Signs: Temp: 98.4 F (36.9 C) (04/09 0846) Temp src: Oral (04/09 0846) BP: 128/45 mmHg (04/09 1139) Pulse Rate: 68 (04/09 1139)  Labs:  Recent Labs  10/05/12 2027 10/06/12 0430  10/07/12 0244 10/07/12 1421 10/07/12 2043 10/08/12 0425  HGB 10.6* 10.0*  --  9.4*  --   --  9.7*  HCT 32.6* 30.5*  --  28.3*  --   --  29.2*  PLT 198 213  --  180  --   --  203  APTT 38*  --   --   --   --   --   --   LABPROT 16.7*  --   --  15.5*  --   --   --   INR 1.39  --   --  1.25  --   --   --   HEPARINUNFRC  --   --   < > 1.14* 0.69 0.57 0.52  CREATININE 0.91 0.70  --  0.63  --   --  0.54  TROPONINI <0.30  --   --   --   --   --   --   < > = values in this interval not displayed. Estimated Creatinine Clearance: 47.3 ml/min (by C-G formula based on Cr of 0.54).  Medical History: Past Medical History  Diagnosis Date  . Stroke 2000 and 2002  . Hypertension   . Erosive gastritis 08/29/2011    Per EGD  . Hiatal hernia 08/29/2011    Per EGD  . Diverticulosis 08/30/2011    Per colonoscopy  . Peripheral neuropathy 08/30/2011  . EKG abnormalities February 2013    Lateral inverted T waves  . 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 Subdural hematoma 08/2012    Status post fall  . Dysphagia, pharyngoesophageal phase 08/16/2012  . Type II or unspecified type diabetes mellitus with neurological manifestations, not stated as uncontrolled(250.60)   . Acute left PCA stroke 08/16/2012   Medications: Scheduled:  . feeding supplement  30 mL Oral TID WC  . feeding supplement  1 Container Oral TID BM  . insulin aspart  0-15 Units Subcutaneous TID WC  . [COMPLETED] magnesium sulfate 1 - 4 g bolus IVPB  4 g Intravenous Once  .  pantoprazole  40 mg Oral BID  . potassium chloride  10 mEq Intravenous Q1 Hr x 4  . sodium chloride  3 mL Intravenous Q12H  . warfarin  4 mg Oral Once  . Warfarin - Pharmacist Dosing Inpatient   Does not apply Q24H   Assessment: Patient with (+)PE per CT report and was started on IV Heparin.  Pt went to [facility] on 4/8 and had IVC filter placed.  Heparin level is within therapeutic range.  Pt is now hemodynamically stable per MD eval.  H/H is stable.  Plan to cautiously initiate Warfarin.  Baseline INR is 1.2.  Acute bilateral pulmonary embolism: Extensive thrombus at the right and left main pulmonary artery bifurcations extending into the lobar arteries on both sides. She is status post IVC filter placement on 10/07/12. With the extensive clot burden, MD feels that she does need to be on some sort of anticoagulation. Of course, with her recent subdural hematoma and propensity for falls , this  will certainly be challenging. Per MD, she will be at higher risk for bleeding including intracranial bleeding with anticoagulation, but the risk of morbidity/mortality due to an under treated, very large pulmonary embolus appears to be greater. We will cautiously start the patient on Coumadin.   Goal of Therapy:  Heparin level 0.3-0.7 units/ml Monitor platelets by anticoagulation protocol: Yes INR 2-3   Plan:  Coumadin 4mg  PO today x 1 Continue Heparin at 700 units/hr  Heparin level daily INR daily CBC daily while on Heparin  Valrie Hart A 10/08/2012,12:28 PM

## 2012-10-08 NOTE — Progress Notes (Signed)
Patient has history of confusion/dementia.  Lashes out at staff, removes equipment frequently.  Refusing to reapply.  Oxygen saturation taken intermittently at present, refuses to wear the probe.  Notified E-Link of lack of data. Using Dinemap to take saturation intermittently for patient comfort

## 2012-10-09 LAB — BASIC METABOLIC PANEL
BUN: 5 mg/dL — ABNORMAL LOW (ref 6–23)
CO2: 22 mEq/L (ref 19–32)
Calcium: 8.1 mg/dL — ABNORMAL LOW (ref 8.4–10.5)
Creatinine, Ser: 0.6 mg/dL (ref 0.50–1.10)

## 2012-10-09 LAB — CBC
MCH: 28.4 pg (ref 26.0–34.0)
MCHC: 32.5 g/dL (ref 30.0–36.0)
MCV: 87.2 fL (ref 78.0–100.0)
Platelets: 214 10*3/uL (ref 150–400)
RDW: 14.8 % (ref 11.5–15.5)
WBC: 6.3 10*3/uL (ref 4.0–10.5)

## 2012-10-09 LAB — GLUCOSE, CAPILLARY
Glucose-Capillary: 100 mg/dL — ABNORMAL HIGH (ref 70–99)
Glucose-Capillary: 106 mg/dL — ABNORMAL HIGH (ref 70–99)
Glucose-Capillary: 112 mg/dL — ABNORMAL HIGH (ref 70–99)
Glucose-Capillary: 129 mg/dL — ABNORMAL HIGH (ref 70–99)
Glucose-Capillary: 130 mg/dL — ABNORMAL HIGH (ref 70–99)

## 2012-10-09 LAB — PROTIME-INR: INR: 1.3 (ref 0.00–1.49)

## 2012-10-09 MED ORDER — WARFARIN SODIUM 2 MG PO TABS
4.0000 mg | ORAL_TABLET | Freq: Once | ORAL | Status: AC
  Administered 2012-10-09: 4 mg via ORAL
  Filled 2012-10-09: qty 2

## 2012-10-09 NOTE — Clinical Documentation Improvement (Signed)
MALNUTRITION DOCUMENTATION CLARIFICATION  THIS DOCUMENT IS NOT A PERMANENT PART OF THE MEDICAL RECORD  TO RESPOND TO THE THIS QUERY, FOLLOW THE INSTRUCTIONS BELOW:  1. If needed, update documentation for the patient's encounter via the notes activity.  2. Access this query again and click edit on the In [employer].  3. After updating, or not, click F2 to complete all highlighted (required) fields concerning your review. Select "additional documentation in the medical record" OR "no additional documentation provided".  4. Click Sign note button.  5. The deficiency will fall out of your In Basket *Please let us know if you are not able to complete this workflow by phone or e-mail (listed below).  Please update your documentation within the medical record to reflect your response to this query.                                                                                        10/09/12   Dear Dr. Kerry Hough / Associates,  In a better effort to capture your patient's severity of illness, reflect appropriate length of stay and utilization of resources, a review of the patient medical record has revealed the following indicators.    Based on your clinical judgment, please clarify and document in a progress note and/or discharge summary the clinical condition associated with the following supporting information:  In responding to this query please exercise your independent judgment.  The fact that a query is asked, does not imply that any particular answer is desired or expected. Possible Clinical Conditions?  Mild Malnutrition  Moderate Malnutrition Severe Malnutrition   Protein Calorie Malnutrition Severe Protein Calorie Malnutrition Other Condition________________ Cannot clinically determine   Supporting Information: Risk Factors: Persistent poor po intake  Recent Acute Left CVA Advanced age Aphasia Dysphasia Acute respiratory failure  Signs & Symptoms: Ht 5'6"    Wt 143  lbs  BMI:  23.18  Weight  Loss  10lbs over 3 months    Treatment  Dys 1 diet   Nutrition Consult NUTRITION DIAGNOSIS:  Malnutrition related to inadequate oral intake as evidenced by average meal intake <75% > 1 month, wt loss 8% < 3 months, mild-moderate depletion of muscle, and fat .  Goal:  Pt to meet >/= 90% of their estimated nutrition needs; and optimize protein intake  Monitor:  Skin assessments,Po intake, labs and wt trends INTERVENTION: Recommend obtain re-weight and  When diet is advanced add:  ProStat 30 ml TID (each 30 ml provides 100 kcal, 15 gr protein) Resource Breeze po TID, each supplement provides 250 kcal and 9 grams of protein. If unable to advance diet recommend initiate alternate nutrition support measures due to pt malnourished state   You may use possible, probable, or suspect with inpatient documentation. possible, probable, suspected diagnoses MUST be documented at the time of discharge  Reviewed:  no additional documentation provided  Thank You,  Harless Litten RN, MSN Clinical Documentation Specialist: Office# [phone] [facility] Health Information Management [place]

## 2012-10-09 NOTE — Progress Notes (Signed)
ANTICOAGULATION CONSULT NOTE  Pharmacy Consult for Heparin --> Warfarin Indication: pulmonary embolus & DVT  No Known Allergies  Patient Measurements: Height: 5\' 6"  (167.6 cm) Weight: 143 lb 8.3 oz (65.1 kg) IBW/kg (Calculated) : 59.3  Vital Signs: Temp: 97.6 F (36.4 C) (04/10 0434) Temp src: Oral (04/10 0434) BP: 150/65 mmHg (04/10 0434) Pulse Rate: 76 (04/10 0434)  Labs:  Recent Labs  10/07/12 0244  10/07/12 2043 10/08/12 0425 10/09/12 0601  HGB 9.4*  --   --  9.7* 8.2*  HCT 28.3*  --   --  29.2* 25.2*  PLT 180  --   --  203 214  LABPROT 15.5*  --   --   --  15.9*  INR 1.25  --   --   --  1.30  HEPARINUNFRC 1.14*  < > 0.57 0.52 0.39  CREATININE 0.63  --   --  0.54 0.60  < > = values in this interval not displayed. Estimated Creatinine Clearance: 47.3 ml/min (by C-G formula based on Cr of 0.6).  Medical History: Past Medical History  Diagnosis Date  . Stroke 2000 and 2002  . Hypertension   . Erosive gastritis 08/29/2011    Per EGD  . Hiatal hernia 08/29/2011    Per EGD  . Diverticulosis 08/30/2011    Per colonoscopy  . Peripheral neuropathy 08/30/2011  . EKG abnormalities February 2013    Lateral inverted T waves  . 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 Subdural hematoma 08/2012    Status post fall  . Dysphagia, pharyngoesophageal phase 08/16/2012  . Type II or unspecified type diabetes mellitus with neurological manifestations, not stated as uncontrolled(250.60)   . Acute left PCA stroke 08/16/2012   Medications: Scheduled:  . feeding supplement  30 mL Oral TID WC  . feeding supplement  1 Container Oral TID BM  . insulin aspart  0-15 Units Subcutaneous TID WC  . [COMPLETED] magnesium sulfate 1 - 4 g bolus IVPB  4 g Intravenous Once  . pantoprazole  40 mg Oral BID  . [COMPLETED] potassium chloride  10 mEq Intravenous Q1 Hr x 4  . sodium chloride  3 mL Intravenous Q12H  . [COMPLETED] warfarin  4 mg Oral Once  . Warfarin - Pharmacist Dosing Inpatient   Does  not apply Q24H   Assessment: Patient with (+)PE per CT on day#2 heparin --> warfarin overlap.  She had an IVC filter placed on 4/8.   MD note reviewed for explanation of risk vs benefits of anticoagulating this patient with large clot burden & recent ICH.  Cautiously initiating warfarin.   No bleeding noted. INR rising to goal.   Goal of Therapy:  Heparin level 0.3-0.7 units/ml Monitor platelets by anticoagulation protocol: Yes INR 2-3   Plan:  Coumadin 4mg  PO today x 1 Continue Heparin at 700 units/hr  Heparin level daily INR daily CBC daily while on Heparin  Doni Bacha, [facility] 10/09/2012,8:46 AM

## 2012-10-09 NOTE — Progress Notes (Signed)
[facility] PROGRESS NOTE  DACHELLE MOLZAHN ZOX:[id number] DOB: 02/05/1924 DOA: 10/05/2012 PCP: Colette Ribas, MD  Brief narrative: 79 year old woman presented from [facility] with acute chest pain, back pain. Found to be hypothermic hypotensive, tachycardic and hypoxic on room air. CT of the chest revealed bilateral pulmonary emboli with high clot burden. Received a total of 6 L of normal saline on admission. Required vasopressor for 5 hours. Started on IV heparin.   Assessment/Plan: 1. Acute bilateral pulmonary embolism: Extensive thrombus at the right and left main pulmonary artery bifurcations extending into the lobar arteries on both sides. Hemodynamically stable. Echocardiogram could not comment on RV strain. Continue IV heparin. She is status post IVC filter placement on 10/07/12. With the extensive clot burden, I do feel that she does need to be on some sort of anticoagulation. Of course, with her recent subdural hematoma and propensity for falls , this will certainly be challenging. I discussed this with her niece [name] and explained the risk of starting anticoagulation. Certainly she will be at higher risk for bleeding including intracranial bleeding with anticoagulation, but the risk of morbidity/mortality due to an under treated, very large pulmonary embolus appears to be greater. We will cautiously start the patient on Coumadin. I feel this would be a better option than the newer anticoagulants such as Xarelto, since Coumadin can be easily reversed if necessary. It may also be beneficial to place the patient on a short course of anticoagulation such as 3 months with repeat CT of the chest in 3 months to evaluate for residual thrombus. She is continued on Coumadin at this time without any evidence of bleeding. Continue current treatments. 2. Acute hypoxic respiratory failure: Stable, minimal oxygen requirement. Secondary to pulmonary embolism. Continue IV heparin, Coumadin and oxygen  supplementation. 3. Right acute lower extremity DVT: IVC filter placement as anticoagulation may be difficult given history of SDH last month and GI bleed 2013. Also will offer more protection in the early period of heparinization. 4. Metabolic acidosis: Resolved. Secondary to lactic acidosis secondary to hypotension.  5. Abdominal tenderness generalized, worse in the epigastric region: Stable. Daughter reported pt has chronic abdominal pain due to diverticulitis.  6. Failure to thrive: Initially her niece reported that the patient was declining at the nursing home. Her by mouth intake has been minimal and she was not participating with physical therapy. She is essentially bed/chair bound. On further conversation today, her niece now reports that she feels the patient was doing better in therapy. She reports that regarding her diet, she did have good days and bad days. Regarding her functional status, she was starting to ambulate with a walker. Her niece wishes her to continue with physical therapy at a nursing home. 7. History of left PCA stroke 08/2012: Was on Aggrenox prior to that admission. Deficits included moderate aphasia, mild right-sided weakness and right homonymous hemianopsia. Patient was placed on aspirin and Plavix but this was discontinued secondary to fall and subdural hematoma 8. Subdural hematoma status post fall 08/2012: Repeat CT that admission demonstrated resolution. No new focal deficits noted. 9. History of erosive gastritis, hiatal hernia, GI bleed: PPI. GI bleed 08/2011. Thought to be diverticular exacerbated by NSAIDs. 10. Diabetes mellitus type 2: Well controlled. Sliding-scale insulin. 11. Normocytic anemia: Stable. 12. Dysphagia: Dysphasia 1. Pure.  Code Status: Limited code: No CPR, no intubation, no BiPAP. Yes to cardioversion or defibrillation for shockable rhythms, yes to pressors and antiarrhythmics IV Family Communication: discussed with niece by telephone (point  of  contact), updated on care Disposition Plan: Remain in ICU.  Erick Blinks, MD  [facility]  Pager [phone] If 7PM-7AM, please contact night-coverage at [email or web address], password [facility] 10/09/2012, 2:28 PM  LOS: 4 days   Consultants:  None  Procedures:  2-D echocardiogram: Left ventricular ejection fraction probably normal. Regional wall motion could not be fully evaluated. Grade 1 diastolic dysfunction. Mild to moderate aortic stenosis. RV grossly normal in size. Function could not be evaluated.  HPI/Subjective: Patient says she does not feel well today. She cannot tell me what exactly is wrong. She only had a small amount of breakfast. Denies any shortness of breath or chest pain. She finds it difficult to eat since she does not have any teeth.  Objective: Filed Vitals:   10/08/12 1300 10/08/12 1419 10/08/12 2118 10/09/12 0434  BP: 125/47 130/52 117/68 150/65  Pulse: 107 100 72 76  Temp:  98.5 F (36.9 C) 98.1 F (36.7 C) 97.6 F (36.4 C)  TempSrc:  Oral Oral Oral  Resp: 15 16 17 16   Height:      Weight:      SpO2: 98% 93% 94% 100%    Intake/Output Summary (Last 24 hours) at 10/09/12 1428 Last data filed at 10/09/12 0300  Gross per 24 hour  Intake      0 ml  Output    500 ml  Net   -500 ml   Filed Weights   10/05/12 2235 10/06/12 0245 10/07/12 0500  Weight: 54.432 kg (120 lb) 57 kg (125 lb 10.6 oz) 65.1 kg (143 lb 8.3 oz)    Exam:  General: No signs of distress Eyes: Pupils, irises and lids appear normal ENT: grossly normal hearing, lips & tongue.  Cardiovascular: RRR, no m/r/g. No LE edema. Telemetry: SR, no arrhythmias  Respiratory: CTA bilaterally, no w/r/r. Normal respiratory effort. Abdomen: soft, nontender, nondistended. Musculoskeletal: Moves all extremities to command. Exam grossly nonfocal. Psychiatric: grossly normal mood and affect, speech fluent and appropriate.  Neurologic: As above. Cranial nerves appear grossly intact.  Data  Reviewed: Basic Metabolic Panel:  Recent Labs Lab 10/05/12 2027 10/06/12 0430 10/07/12 0244 10/08/12 0425 10/08/12 0658 10/09/12 0601  NA 134* 139 139 136  --  134*  K 4.4 4.6 3.5 2.7*  --  3.2*  CL 104 111 107 102  --  100  CO2 15* 15* 21 23  --  22  GLUCOSE 266* 183* 83 93  --  116*  BUN 10 9 8  4*  --  5*  CREATININE 0.91 0.70 0.63 0.54  --  0.60  CALCIUM 8.9 8.1* 8.2* 8.4  --  8.1*  MG  --   --   --   --  1.2*  --    Liver Function Tests:  Recent Labs Lab 10/05/12 2027  AST 72*  ALT 27  ALKPHOS 57  BILITOT 0.3  PROT 5.9*  ALBUMIN 2.8*   CBC:  Recent Labs Lab 10/05/12 2027 10/06/12 0430 10/07/12 0244 10/08/12 0425 10/09/12 0601  WBC 8.3 7.0 4.4 5.8 6.3  HGB 10.6* 10.0* 9.4* 9.7* 8.2*  HCT 32.6* 30.5* 28.3* 29.2* 25.2*  MCV 88.8 87.4 87.3 88.0 87.2  PLT 198 213 180 203 214   Cardiac Enzymes:  Recent Labs Lab 10/05/12 2027  TROPONINI <0.30     Recent Labs  10/05/12 2027  PROBNP 332.0   CBG:  Recent Labs Lab 10/08/12 2015 10/09/12 0010 10/09/12 0431 10/09/12 0741 10/09/12 1147  GLUCAP 121* 112* 106* 106* 130*  Studies: No results found.  Scheduled Meds: . feeding supplement  30 mL Oral TID WC  . feeding supplement  1 Container Oral TID BM  . insulin aspart  0-15 Units Subcutaneous TID WC  . pantoprazole  40 mg Oral BID  . sodium chloride  3 mL Intravenous Q12H  . warfarin  4 mg Oral ONCE-1800  . Warfarin - Pharmacist Dosing Inpatient   Does not apply Q24H   Continuous Infusions: . sodium chloride 20 mL/hr at 10/08/12 1220  . heparin 700 Units/hr (10/09/12 0334)    Principal Problem:   Acute pulmonary embolism Active Problems:   Anemia   Type II or unspecified type diabetes mellitus with neurological manifestations, not stated as uncontrolled(250.60)   CVA (cerebral infarction)   Subdural hematoma   Homonymous hemianopia   Dysphagia, pharyngoesophageal phase   Hypotension, unspecified   Acute respiratory failure with  hypoxia   Metabolic acidosis   Adult failure to thrive   DVT, lower extremity   Erick Blinks, MD  [facility] Pager [phone] If 7PM-7AM, please contact night-coverage at [email or web address], password [facility] 10/09/2012, 2:28 PM  LOS: 4 days   Time spent: 25 minutes

## 2012-10-09 NOTE — Evaluation (Signed)
Physical Therapy Evaluation Patient Details Name: [name] MRN: [id number] DOB: 02/05/1924 Today's Date: 10/09/2012 Time: 1040-1110 PT Time Calculation (min): 30 min  PT Assessment / Plan / Recommendation Clinical Impression  Pt was seen for evaluation following hospital stay for PE.  She was awake but not able to move in the bed at all due to severe pain in the L hip with any movement of that joint.  I spooke with the PT at [facility] where she had recently stayed for rehab.  Therapist states that the pt was difficullt to motivate, but she finally becalme engaged with the program and was walking functionally with a walker just prior to admission here.  Because she succeeded with rehab previously, I will recommend SNF at d/c.    PT Assessment  Patient needs continued PT services    Follow Up Recommendations  SNF    Does the patient have the potential to tolerate intense rehabilitation      Barriers to Discharge Decreased caregiver support      Equipment Recommendations  None recommended by PT    Recommendations for Other Services     Frequency Min 3X/week    Precautions / Restrictions Precautions Precautions: Fall Restrictions Weight Bearing Restrictions: No   Pertinent Vitals/Pain       Mobility  Bed Mobility Bed Mobility: Not assessed    Exercises     PT Diagnosis: Difficulty walking;Generalized weakness;Acute pain  PT Problem List: Decreased strength;Decreased range of motion;Decreased activity tolerance;Decreased mobility;Cardiopulmonary status limiting activity;Pain PT Treatment Interventions: Therapeutic activities;Functional mobility training;Therapeutic exercise;Neuromuscular re-education   PT Goals Acute Rehab PT Goals PT Goal Formulation: Patient unable to participate in goal setting Time For Goal Achievement: 10/23/12 Potential to Achieve Goals: Fair Pt will Roll Supine to Right Side: with min assist PT Goal: Rolling Supine to Right Side - Progress:  Goal set today Pt will Roll Supine to Left Side: with min assist PT Goal: Rolling Supine to Left Side - Progress: Goal set today Pt will go Supine/Side to Sit: with mod assist;with HOB not 0 degrees (comment degree) PT Goal: Supine/Side to Sit - Progress: Goal set today Pt will go Sit to Supine/Side: with mod assist;with HOB not 0 degrees (comment degree) PT Goal: Sit to Supine/Side - Progress: Goal set today Pt will go Sit to Stand: with max assist;with upper extremity assist PT Goal: Sit to Stand - Progress: Goal set today Pt will go Stand to Sit: with mod assist;with upper extremity assist PT Goal: Stand to Sit - Progress: Goal set today Pt will Transfer Bed to Chair/Chair to Bed: with max assist PT Transfer Goal: Bed to Chair/Chair to Bed - Progress: Discontinued (comment)  Visit Information  Last PT Received On: 10/09/12    Subjective Data  Subjective: I ain't walkin today Patient Stated Goal: none stated   Prior Functioning  Home Living Lives With: Other (Comment) Available Help at Discharge: Personal care attendant Type of Home: Skilled Nursing Facility Home Access: Level entry Home Layout: One level Prior Function Level of Independence: Needs assistance Needs Assistance: Bathing;Dressing;Toileting;Meal Prep;Light Housekeeping;Gait;Transfers Bath: Moderate Dressing: Moderate Toileting: Moderate Meal Prep: Total Light Housekeeping: Total Gait Assistance: pt ambulated with a walker with PT, reportedly functional distances Able to Take Stairs?: No Driving: No Vocation: Retired Musician: No difficulties    Copywriter, advertising Overall Cognitive Status: History of cognitive impairments - at baseline Arousal/Alertness: Lethargic Orientation Level: Disoriented to;Place;Time;Situation Behavior During Session: Lethargic Cognition - Other Comments: pt not desirous of working  with me    Extremity/Trunk Assessment Right Lower Extremity Assessment RLE  ROM/Strength/Tone: Deficits RLE ROM/Strength/Tone Deficits: strength generally 3-/5...hemiparesis RLE Sensation: Deficits RLE Sensation Deficits: decreased to light touch in foot Left Lower Extremity Assessment LLE ROM/Strength/Tone: Unable to fully assess;Due to pain LLE ROM/Strength/Tone Deficits: unable to assess due to severe pain in L hip with any movement of the hip.   Balance    End of Session PT - End of Session Activity Tolerance: Patient limited by pain;Patient limited by fatigue Patient left: in bed;with bed alarm set Nurse Communication: Mobility status  GP     Konrad Penta 10/09/2012, 11:35 AM

## 2012-10-10 DIAGNOSIS — D649 Anemia, unspecified: Secondary | ICD-10-CM

## 2012-10-10 LAB — BASIC METABOLIC PANEL
CO2: 24 mEq/L (ref 19–32)
Chloride: 101 mEq/L (ref 96–112)
Glucose, Bld: 103 mg/dL — ABNORMAL HIGH (ref 70–99)
Sodium: 136 mEq/L (ref 135–145)

## 2012-10-10 LAB — CBC
HCT: 22.6 % — ABNORMAL LOW (ref 36.0–46.0)
MCV: 87.6 fL (ref 78.0–100.0)
RBC: 2.58 MIL/uL — ABNORMAL LOW (ref 3.87–5.11)
WBC: 8.4 10*3/uL (ref 4.0–10.5)

## 2012-10-10 LAB — GLUCOSE, CAPILLARY
Glucose-Capillary: 107 mg/dL — ABNORMAL HIGH (ref 70–99)
Glucose-Capillary: 99 mg/dL (ref 70–99)
Glucose-Capillary: 151 mg/dL — ABNORMAL HIGH (ref 70–99)
Glucose-Capillary: 138 mg/dL — ABNORMAL HIGH (ref 70–99)

## 2012-10-10 LAB — PREPARE RBC (CROSSMATCH)

## 2012-10-10 LAB — PROTIME-INR: INR: 1.48 (ref 0.00–1.49)

## 2012-10-10 MED ORDER — WARFARIN SODIUM 2 MG PO TABS: 4.0000 mg | ORAL_TABLET | Freq: Once | ORAL | Status: DC

## 2012-10-10 MED ORDER — POTASSIUM CHLORIDE 10 MEQ/100ML IV SOLN
10.0000 meq | INTRAVENOUS | Status: AC
  Administered 2012-10-10 (×4): 10 meq via INTRAVENOUS
  Filled 2012-10-10: qty 200
  Filled 2012-10-10 (×2): qty 100

## 2012-10-10 NOTE — Progress Notes (Signed)
Physical Therapy Treatment Patient Details Name: [name] MRN: [id number] DOB: 02/05/1924 Today's Date: 10/10/2012 Time: 6295-2841 PT Time Calculation (min): 44 min  PT Assessment / Plan / Recommendation Comments on Treatment Session  Pt more cooperative today.  She was able to tolerate gentle bed exercise with decreased L hip pain today.  We were able to progress to sitting in a recliner with max assist.  She appears to be worn out from multiple illnesses and seems emotionally drained.    Follow Up Recommendations        Does the patient have the potential to tolerate intense rehabilitation     Barriers to Discharge        Equipment Recommendations       Recommendations for Other Services    Frequency     Plan Discharge plan remains appropriate;Frequency remains appropriate    Precautions / Restrictions Restrictions Weight Bearing Restrictions: No   Pertinent Vitals/Pain     Mobility  Bed Mobility Supine to Sit: 2: Max assist;HOB elevated Sit to Supine: Not Tested (comment) Transfers Transfers: Stand Pivot Transfers Sit to Stand: 2: Max assist    Exercises General Exercises - Lower Extremity Ankle Circles/Pumps: AAROM;Both;10 reps;Supine Heel Slides: AAROM;Both;10 reps;Supine Hip ABduction/ADduction: AAROM;Both;10 reps;Supine   PT Diagnosis:    PT Problem List:   PT Treatment Interventions:     PT Goals Acute Rehab PT Goals PT Goal: Supine/Side to Sit - Progress: Progressing toward goal Pt will go Sit to Stand: with mod assist;with upper extremity assist PT Goal: Sit to Stand - Progress: Updated due to goal met PT Goal: Stand to Sit - Progress: Progressing toward goal Pt will Transfer Bed to Chair/Chair to Bed: with mod assist PT Transfer Goal: Bed to Chair/Chair to Bed - Progress: Updated due to goal met  Visit Information  Last PT Received On: 10/10/12    Subjective Data  Subjective: I'm so weak   Cognition  Cognition Overall Cognitive  Status: History of cognitive impairments - at baseline Arousal/Alertness: Awake/alert Orientation Level: Appears intact for tasks assessed Behavior During Session: [facility] for tasks performed    Balance     End of Session PT - End of Session Equipment Utilized During Treatment: Gait belt Activity Tolerance: Patient limited by pain;Patient limited by fatigue Patient left: in chair;with call bell/phone within reach;with chair alarm set Nurse Communication: Mobility status   GP     Konrad Penta 10/10/2012, 10:05 AM

## 2012-10-10 NOTE — Plan of Care (Signed)
Problem: Phase II Progression Outcomes Goal: Activity at appropriate level-compared to baseline (UP IN CHAIR FOR HEMODIALYSIS)  Outcome: Progressing Patient out of bed to chair today.

## 2012-10-10 NOTE — Progress Notes (Signed)
[facility] PROGRESS NOTE  ROGENA DEUPREE MVH:[id number] DOB: 02/05/1924 DOA: 10/05/2012 PCP: Colette Ribas, MD  Brief narrative: 79 year old woman presented from [facility] with acute chest pain, back pain. Found to be hypothermic hypotensive, tachycardic and hypoxic on room air. CT of the chest revealed bilateral pulmonary emboli with high clot burden. Received a total of 6 L of normal saline on admission. Required vasopressor for 5 hours. Started on IV heparin.   Assessment/Plan: 1. Acute bilateral pulmonary embolism: Extensive thrombus at the right and left main pulmonary artery bifurcations extending into the lobar arteries on both sides. Hemodynamically stable. Echocardiogram could not comment on RV strain. Continue IV heparin. She is status post IVC filter placement on 10/07/12. With the extensive clot burden, I do feel that she does need to be on some sort of anticoagulation. Of course, with her recent subdural hematoma and propensity for falls , this will certainly be challenging. I discussed this with her niece [name] and explained the risk of starting anticoagulation. Certainly she will be at higher risk for bleeding including intracranial bleeding with anticoagulation, but the risk of morbidity/mortality due to an under treated, very large pulmonary embolus appears to be greater. Patient has been started on Coumadin. I feel this would be a better option than the newer anticoagulants such as Xarelto, since Coumadin can be easily reversed if necessary. It may also be beneficial to place the patient on a short course of anticoagulation such as 3 months with repeat CT of the chest in 3 months to evaluate for residual thrombus. Continue current treatments. 2. Acute hypoxic respiratory failure: Stable, minimal oxygen requirement. Secondary to pulmonary embolism. Continue IV heparin, Coumadin and oxygen supplementation. 3. Right acute lower extremity DVT: IVC filter placement as  anticoagulation may be difficult given history of SDH last month and GI bleed 2013. Also will offer more protection in the early period of heparinization. 4. Metabolic acidosis: Resolved. Secondary to lactic acidosis secondary to hypotension.  5. Abdominal tenderness generalized, worse in the epigastric region: Stable. Daughter reported pt has chronic abdominal pain due to diverticulitis.  6. Failure to thrive: Initially her niece reported that the patient was declining at the nursing home. Her by mouth intake has been minimal and she was not participating with physical therapy. She is essentially bed/chair bound. On further conversation today, her niece now reports that she feels the patient was doing better in therapy. She reports that regarding her diet, she did have good days and bad days. Regarding her functional status, she was starting to ambulate with a walker. Her niece wishes her to continue with physical therapy at a nursing home. 7. History of left PCA stroke 08/2012: Was on Aggrenox prior to that admission. Deficits included moderate aphasia, mild right-sided weakness and right homonymous hemianopsia. Patient was placed on aspirin and Plavix but this was discontinued secondary to fall and subdural hematoma 8. Subdural hematoma status post fall 08/2012: Repeat CT that admission demonstrated resolution. No new focal deficits noted. 9. History of erosive gastritis, hiatal hernia, GI bleed: PPI. GI bleed 08/2011. Thought to be diverticular exacerbated by NSAIDs. 10. Diabetes mellitus type 2: Well controlled. Sliding-scale insulin. 11. Normocytic anemia: Patient's hemoglobin has started to trend down.  There has not been any gross evidence of bleeding. Will transfuse 2 unit prbc today.  Check stool of occult blood. Will have to temporarily hold anticoagulation for now until bleeding is ruled out. 12. Dysphagia: Dysphasia 1. Pure.  Code Status: Limited code: No CPR,  no intubation, no BiPAP. Yes to  cardioversion or defibrillation for shockable rhythms, yes to pressors and antiarrhythmics IV Family Communication: discussed with niece by telephone (point of contact), updated on care Disposition Plan: will need to go to SNF once stable  Erick Blinks, MD  [facility]  Pager [phone] If 7PM-7AM, please contact night-coverage at [email or web address], password [facility] 10/10/2012, 12:25 PM  LOS: 5 days   Consultants:  None  Procedures:  2-D echocardiogram: Left ventricular ejection fraction probably normal. Regional wall motion could not be fully evaluated. Grade 1 diastolic dysfunction. Mild to moderate aortic stenosis. RV grossly normal in size. Function could not be evaluated.  HPI/Subjective: Patient feels tired. No shortness of breath or chest pain.  No melena/hematochezia noted by staff.  Objective: Filed Vitals:   10/09/12 0434 10/09/12 1500 10/09/12 2016 10/10/12 0411  BP: 150/65 145/74 138/40 122/61  Pulse: 76 77 80 81  Temp: 97.6 F (36.4 C) 98.5 F (36.9 C) 99.7 F (37.6 C) 99.9 F (37.7 C)  TempSrc: Oral Oral Oral Oral  Resp: 16 18 18 17   Height:      Weight:      SpO2: 100% 100% 92% 90%    Intake/Output Summary (Last 24 hours) at 10/10/12 1225 Last data filed at 10/10/12 4540  Gross per 24 hour  Intake    120 ml  Output    750 ml  Net   -630 ml   Filed Weights   10/05/12 2235 10/06/12 0245 10/07/12 0500  Weight: 54.432 kg (120 lb) 57 kg (125 lb 10.6 oz) 65.1 kg (143 lb 8.3 oz)    Exam:  General: No signs of distress Eyes: Pupils, irises and lids appear normal ENT: grossly normal hearing, lips & tongue.  Cardiovascular: RRR, no m/r/g. No LE edema. Telemetry: SR, no arrhythmias  Respiratory: CTA bilaterally, no w/r/r. Normal respiratory effort. Abdomen: soft, nontender, nondistended. No bruising noted in abdomen or flanks noted. Musculoskeletal: Moves all extremities to command. Exam grossly nonfocal. Psychiatric: grossly normal mood and affect,  speech fluent and appropriate.  Neurologic: As above. Cranial nerves appear grossly intact.  Data Reviewed: Basic Metabolic Panel:  Recent Labs Lab 10/06/12 0430 10/07/12 0244 10/08/12 0425 10/08/12 0658 10/09/12 0601 10/10/12 0501  NA 139 139 136  --  134* 136  K 4.6 3.5 2.7*  --  3.2* 3.1*  CL 111 107 102  --  100 101  CO2 15* 21 23  --  22 24  GLUCOSE 183* 83 93  --  116* 103*  BUN 9 8 4*  --  5* 12  CREATININE 0.70 0.63 0.54  --  0.60 0.56  CALCIUM 8.1* 8.2* 8.4  --  8.1* 8.3*  MG  --   --   --  1.2*  --   --    Liver Function Tests:  Recent Labs Lab 10/05/12 2027  AST 72*  ALT 27  ALKPHOS 57  BILITOT 0.3  PROT 5.9*  ALBUMIN 2.8*   CBC:  Recent Labs Lab 10/06/12 0430 10/07/12 0244 10/08/12 0425 10/09/12 0601 10/10/12 0501  WBC 7.0 4.4 5.8 6.3 8.4  HGB 10.0* 9.4* 9.7* 8.2* 7.3*  HCT 30.5* 28.3* 29.2* 25.2* 22.6*  MCV 87.4 87.3 88.0 87.2 87.6  PLT 213 180 203 214 220   Cardiac Enzymes:  Recent Labs Lab 10/05/12 2027  TROPONINI <0.30     Recent Labs  10/05/12 2027  PROBNP 332.0   CBG:  Recent Labs Lab 10/09/12 1637 10/09/12 2014 10/09/12 2354  10/10/12 0410 10/10/12 0736  GLUCAP 98 129* 100* 107* 99    Studies: No results found.  Scheduled Meds: . feeding supplement  30 mL Oral TID WC  . feeding supplement  1 Container Oral TID BM  . insulin aspart  0-15 Units Subcutaneous TID WC  . pantoprazole  40 mg Oral BID  . sodium chloride  3 mL Intravenous Q12H  . warfarin  4 mg Oral Once  . Warfarin - Pharmacist Dosing Inpatient   Does not apply Q24H   Continuous Infusions: . sodium chloride 20 mL/hr at 10/08/12 1220  . heparin 700 Units/hr (10/09/12 0334)    Principal Problem:   Acute pulmonary embolism Active Problems:   Anemia   Type II or unspecified type diabetes mellitus with neurological manifestations, not stated as uncontrolled(250.60)   CVA (cerebral infarction)   Subdural hematoma   Homonymous hemianopia    Dysphagia, pharyngoesophageal phase   Hypotension, unspecified   Acute respiratory failure with hypoxia   Metabolic acidosis   Adult failure to thrive   DVT, lower extremity   Erick Blinks, MD  [facility] Pager [phone] If 7PM-7AM, please contact night-coverage at [email or web address], password [facility] 10/10/2012, 12:25 PM  LOS: 5 days   Time spent: 25 minutes

## 2012-10-10 NOTE — Progress Notes (Addendum)
ANTICOAGULATION CONSULT NOTE  Pharmacy Consult for Heparin --> Warfarin Indication: pulmonary embolus & DVT  No Known Allergies  Patient Measurements: Height: 5\' 6"  (167.6 cm) Weight: 143 lb 8.3 oz (65.1 kg) IBW/kg (Calculated) : 59.3  Vital Signs: Temp: 99.9 F (37.7 C) (04/11 0411) Temp src: Oral (04/11 0411) BP: 122/61 mmHg (04/11 0411) Pulse Rate: 81 (04/11 0411)  Labs:  Recent Labs  10/08/12 0425 10/09/12 0601 10/10/12 0501  HGB 9.7* 8.2* 7.3*  HCT 29.2* 25.2* 22.6*  PLT 203 214 220  LABPROT  --  15.9* 17.5*  INR  --  1.30 1.48  HEPARINUNFRC 0.52 0.39 0.44  CREATININE 0.54 0.60 0.56   Estimated Creatinine Clearance: 47.3 ml/min (by C-G formula based on Cr of 0.56).  Medical History: Past Medical History  Diagnosis Date  . Stroke 2000 and 2002  . Hypertension   . Erosive gastritis 08/29/2011    Per EGD  . Hiatal hernia 08/29/2011    Per EGD  . Diverticulosis 08/30/2011    Per colonoscopy  . Peripheral neuropathy 08/30/2011  . EKG abnormalities February 2013    Lateral inverted T waves  . 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 Subdural hematoma 08/2012    Status post fall  . Dysphagia, pharyngoesophageal phase 08/16/2012  . Type II or unspecified type diabetes mellitus with neurological manifestations, not stated as uncontrolled(250.60)   . Acute left PCA stroke 08/16/2012   Medications: Scheduled:  . feeding supplement  30 mL Oral TID WC  . feeding supplement  1 Container Oral TID BM  . insulin aspart  0-15 Units Subcutaneous TID WC  . pantoprazole  40 mg Oral BID  . sodium chloride  3 mL Intravenous Q12H  . [COMPLETED] warfarin  4 mg Oral ONCE-1800  . warfarin  4 mg Oral Once  . Warfarin - Pharmacist Dosing Inpatient   Does not apply Q24H   Assessment: Patient with (+)PE per CT on day#3 heparin --> warfarin overlap.  She had an IVC filter placed on 4/8.   MD note reviewed for explanation of risk vs benefits of anticoagulating this patient with large clot  burden & recent ICH.  Cautiously initiating warfarin.   No bleeding noted but H/H dropping.  INR rising to goal.   Goal of Therapy:  Heparin level 0.3-0.7 units/ml Monitor platelets by anticoagulation protocol: Yes INR 2-3   Plan:  Coumadin 4mg  PO today x 1 Continue Heparin at 700 units/hr  Heparin level daily INR daily CBC daily while on Heparin  Valrie Hart A 10/10/2012,8:07 AM

## 2012-10-10 NOTE — Progress Notes (Signed)
VS checked prior to blood administration, but deleted from machine prior to charted. VS were checked with 2 RNs in the room, and were stable. Pt is tolerating transfusion well at 15 min VS check.  Sheryn Bison

## 2012-10-10 NOTE — Progress Notes (Signed)
UR Chart Review Completed  

## 2012-10-10 NOTE — Progress Notes (Signed)
Heparin drip discontinued per MD's orders.

## 2012-10-11 ENCOUNTER — Inpatient Hospital Stay (HOSPITAL_COMMUNITY): Payer: Medicare PPO

## 2012-10-11 LAB — TYPE AND SCREEN
Antibody Screen: NEGATIVE
Unit division: 0

## 2012-10-11 LAB — CBC
HCT: 32 % — ABNORMAL LOW (ref 36.0–46.0)
Platelets: 208 10*3/uL (ref 150–400)
RDW: 14.9 % (ref 11.5–15.5)
WBC: 10.6 10*3/uL — ABNORMAL HIGH (ref 4.0–10.5)

## 2012-10-11 LAB — BASIC METABOLIC PANEL
Chloride: 99 mEq/L (ref 96–112)
Creatinine, Ser: 0.49 mg/dL — ABNORMAL LOW (ref 0.50–1.10)
GFR calc Af Amer: >90 mL/min (ref >90)

## 2012-10-11 LAB — GLUCOSE, CAPILLARY
Glucose-Capillary: 102 mg/dL — ABNORMAL HIGH (ref 70–99)
Glucose-Capillary: 110 mg/dL — ABNORMAL HIGH (ref 70–99)
Glucose-Capillary: 85 mg/dL (ref 70–99)
Glucose-Capillary: 95 mg/dL (ref 70–99)

## 2012-10-11 LAB — PROTIME-INR
INR: 1.48 (ref 0.00–1.49)
Prothrombin Time: 17.5 seconds — ABNORMAL HIGH (ref 11.6–15.2)

## 2012-10-11 NOTE — Progress Notes (Addendum)
Dr. Phillips Odor returned paged regarding patient's left hip/thigh pain and abnormal CT abdomen results. Orders received to check pedal as well as femoral pulses in left leg periodically and measure circumference of left thigh periodically.  Left leg elevated on two pillows. Pt turned to left side. Neurovascular assessment completed. Pt denies numbness or tingling to LLE. Pedal pulse, popliteal pulse and femoral pulse palpated and 1+.  Pt's left leg/foot warm to touch.  Skin color appropriate for ethnicity, with slight ecchymosis noted to left inner thigh/groin area.  Circumference of left thigh measured at 21 inches.  Pt appears comfortable at this time. Eyes closed, resting. Will continue to monitor.

## 2012-10-11 NOTE — Progress Notes (Signed)
Dr. Phillips Odor paged in regards to patient's c/o severe left hip/thigh pain. Also paged and made aware the CT abdomen performed earlier today had resulted with acute findings. No return page at this time. Will continue to monitor patient.

## 2012-10-11 NOTE — Progress Notes (Signed)
[facility] PROGRESS NOTE  [name] ZOX:[id number] DOB: 02/05/1924 DOA: 10/05/2012 PCP: Danielle Ribas, MD  Brief narrative: 79 year old woman presented from [facility] with acute chest pain, back pain. Found to be hypothermic hypotensive, tachycardic and hypoxic on room air. CT of the chest revealed bilateral pulmonary emboli with high clot burden. Received a total of 6 L of normal saline on admission. Required vasopressor for 5 hours. Started on IV heparin.   Assessment/Plan: 1. Acute bilateral pulmonary embolism: Extensive thrombus at the right and left main pulmonary artery bifurcations extending into the lobar arteries on both sides. Hemodynamically stable. Echocardiogram could not comment on RV strain. Continue IV heparin. She is status post IVC filter placement on 10/07/12. With the extensive clot burden, I do feel that she does need to be on some sort of anticoagulation. Of course, with her recent subdural hematoma and propensity for falls , this will certainly be challenging. I discussed this with her niece [name] and explained the risk of starting anticoagulation. Certainly she will be at higher risk for bleeding including intracranial bleeding with anticoagulation, but the risk of morbidity/mortality due to an under treated, very large pulmonary embolus appears to be greater. Patient has been started on Coumadin. I feel this would be a better option than the newer anticoagulants such as Xarelto, since Coumadin can be easily reversed if necessary. It may also be beneficial to place the patient on a short course of anticoagulation such as 3 months with repeat CT of the chest in 3 months to evaluate for residual thrombus. Currently, anticoagulation is on hold due to concerns of low hemoglobin. 2. Acute hypoxic respiratory failure: Stable, minimal oxygen requirement. Secondary to pulmonary embolism. Restart anticoagulation when safe to do so. 3. Right acute lower extremity DVT: IVC  filter placement as anticoagulation may be difficult given history of SDH last month and GI bleed 2013. Also will offer more protection in the early period of heparinization. 4. Metabolic acidosis: Resolved. Secondary to lactic acidosis secondary to hypotension.  5. Abdominal tenderness generalized, worse in the epigastric region: Stable. Daughter reported pt has chronic abdominal pain due to diverticulitis.  6. Failure to thrive: Initially her niece reported that the patient was declining at the nursing home. Her by mouth intake has been minimal and she was not participating with physical therapy. She is essentially bed/chair bound. On further conversation today, her niece now reports that she feels the patient was doing better in therapy. She reports that regarding her diet, she did have good days and bad days. Regarding her functional status, she was starting to ambulate with a walker. Her niece wishes her to continue with physical therapy at a nursing home. 7. History of left PCA stroke 08/2012: Was on Aggrenox prior to that admission. Deficits included moderate aphasia, mild right-sided weakness and right homonymous hemianopsia. Patient was placed on aspirin and Plavix but this was discontinued secondary to fall and subdural hematoma 8. Subdural hematoma status post fall 08/2012: Repeat CT that admission demonstrated resolution. No new focal deficits noted. 9. History of erosive gastritis, hiatal hernia, GI bleed: PPI. GI bleed 08/2011. Thought to be diverticular exacerbated by NSAIDs. 10. Diabetes mellitus type 2: Well controlled. Sliding-scale insulin. 11. Normocytic anemia: Patient's hemoglobin started to trend down.  She was transfused 2 units of PRBCs yesterday. She has not had a bowel movement and therefore stool occult blood has not been collected. She does not have any visible evidence of bleeding. She is complaining of some pain  in her left hip/flank area. There is no obvious bruising in the area.  We will obtain a CT abdomen and pelvis to rule out any underlying retroperitoneal hematoma. If no clear source of bleeding can be found by tomorrow, and hemoglobin remains stable, we may consider restarting anticoagulation. 12. Dysphagia: Dysphagia 1. Pure. Her family now reports that patient was eating solid food at the nursing home prior to admission. She does not like the taste of the pure food and therefore is not eating. They are requesting that the patient be changed to regular diet. We will change to a regular diet and observe if the patient can tolerate this and if her by mouth intake increases.  Code Status: Limited code: No CPR, no intubation, no BiPAP. Yes to cardioversion or defibrillation for shockable rhythms, yes to pressors and antiarrhythmics IV Family Communication: discussed with niece by telephone (point of contact), updated on care Disposition Plan: will need to go to SNF once stable  Danielle Blinks, MD  [facility]  Pager [phone] If 7PM-7AM, please contact night-coverage at [email or web address], password [facility] 10/11/2012, 1:34 PM  LOS: 6 days   Consultants:  None  Procedures:  2-D echocardiogram: Left ventricular ejection fraction probably normal. Regional wall motion could not be fully evaluated. Grade 1 diastolic dysfunction. Mild to moderate aortic stenosis. RV grossly normal in size. Function could not be evaluated.  HPI/Subjective: Patient feels tired. No shortness of breath or chest pain.  No melena/hematochezia noted by staff.  Objective: Filed Vitals:   10/10/12 2118 10/10/12 2218 10/10/12 2318 10/11/12 0442  BP: 158/73 147/62 141/55 150/65  Pulse: 69 74 71 71  Temp: 98 F (36.7 C) 98.5 F (36.9 C) 99.4 F (37.4 C) 98.5 F (36.9 C)  TempSrc: Oral Oral Oral Oral  Resp: 18 18 18 16   Height:      Weight:      SpO2:    100%    Intake/Output Summary (Last 24 hours) at 10/11/12 1334 Last data filed at 10/10/12 2300  Gross per 24 hour  Intake    230  ml  Output    200 ml  Net     30 ml   Filed Weights   10/05/12 2235 10/06/12 0245 10/07/12 0500  Weight: 54.432 kg (120 lb) 57 kg (125 lb 10.6 oz) 65.1 kg (143 lb 8.3 oz)    Exam:  General: No signs of distress Eyes: Pupils, irises and lids appear normal ENT: grossly normal hearing, lips & tongue.  Cardiovascular: RRR, no m/r/g. No LE edema. Telemetry: SR, no arrhythmias  Respiratory: CTA bilaterally, no w/r/r. Normal respiratory effort. Abdomen: soft, nontender, nondistended. No bruising noted in abdomen or flanks noted. Musculoskeletal: Moves all extremities to command. She is weak on the left side from her prior stroke. Psychiatric: grossly normal mood and affect, speech fluent and confused at times.  Neurologic: As above. Cranial nerves appear grossly intact.  Data Reviewed: Basic Metabolic Panel:  Recent Labs Lab 10/07/12 0244 10/08/12 0425 10/08/12 4540 10/09/12 0601 10/10/12 0501 10/11/12 0706  NA 139 136  --  134* 136 134*  K 3.5 2.7*  --  3.2* 3.1* 3.3*  CL 107 102  --  100 101 99  CO2 21 23  --  22 24 24   GLUCOSE 83 93  --  116* 103* 93  BUN 8 4*  --  5* 12 12  CREATININE 0.63 0.54  --  0.60 0.56 0.49*  CALCIUM 8.2* 8.4  --  8.1* 8.3* 8.7  MG  --   --  1.2*  --   --   --    Liver Function Tests:  Recent Labs Lab 10/05/12 2027  AST 72*  ALT 27  ALKPHOS 57  BILITOT 0.3  PROT 5.9*  ALBUMIN 2.8*   CBC:  Recent Labs Lab 10/07/12 0244 10/08/12 0425 10/09/12 0601 10/10/12 0501 10/11/12 0706  WBC 4.4 5.8 6.3 8.4 10.6*  HGB 9.4* 9.7* 8.2* 7.3* 10.9*  HCT 28.3* 29.2* 25.2* 22.6* 32.0*  MCV 87.3 88.0 87.2 87.6 85.8  PLT 180 203 214 220 208   Cardiac Enzymes:  Recent Labs Lab 10/05/12 2027  TROPONINI <0.30     Recent Labs  10/05/12 2027  PROBNP 332.0   CBG:  Recent Labs Lab 10/10/12 2039 10/11/12 0033 10/11/12 0438 10/11/12 0822 10/11/12 1223  GLUCAP 123* 110* 85 92 102*    Studies: No results found.  Scheduled Meds: .  feeding supplement  30 mL Oral TID WC  . feeding supplement  1 Container Oral TID BM  . insulin aspart  0-15 Units Subcutaneous TID WC  . pantoprazole  40 mg Oral BID  . sodium chloride  3 mL Intravenous Q12H  . Warfarin - Pharmacist Dosing Inpatient   Does not apply Q24H   Continuous Infusions: . sodium chloride 20 mL/hr at 10/08/12 1220    Principal Problem:   Acute pulmonary embolism Active Problems:   Anemia   Type II or unspecified type diabetes mellitus with neurological manifestations, not stated as uncontrolled(250.60)   CVA (cerebral infarction)   Subdural hematoma   Homonymous hemianopia   Dysphagia, pharyngoesophageal phase   Hypotension, unspecified   Acute respiratory failure with hypoxia   Metabolic acidosis   Adult failure to thrive   DVT, lower extremity   Danielle Blinks, MD  [facility] Pager [phone] If 7PM-7AM, please contact night-coverage at [email or web address], password [facility] 10/11/2012, 1:34 PM  LOS: 6 days   Time spent: 25 minutes

## 2012-10-11 NOTE — Progress Notes (Signed)
ANTICOAGULATION CONSULT NOTE  Pharmacy Consult for Heparin --> Warfarin Indication: pulmonary embolus & DVT  No Known Allergies  Patient Measurements: Height: 5\' 6"  (167.6 cm) Weight: 143 lb 8.3 oz (65.1 kg) IBW/kg (Calculated) : 59.3  Vital Signs: Temp: 98.5 F (36.9 C) (04/12 0442) Temp src: Oral (04/12 0442) BP: 150/65 mmHg (04/12 0442) Pulse Rate: 71 (04/12 0442)  Labs:  Recent Labs  10/09/12 0601 10/10/12 0501 10/11/12 0706  HGB 8.2* 7.3* 10.9*  HCT 25.2* 22.6* 32.0*  PLT 214 220 208  LABPROT 15.9* 17.5* 17.5*  INR 1.30 1.48 1.48  HEPARINUNFRC 0.39 0.44 0.10*  CREATININE 0.60 0.56 0.49*   Estimated Creatinine Clearance: 47.3 ml/min (by C-G formula based on Cr of 0.49).  Medical History: Past Medical History  Diagnosis Date  . Stroke 2000 and 2002  . Hypertension   . Erosive gastritis 08/29/2011    Per EGD  . Hiatal hernia 08/29/2011    Per EGD  . Diverticulosis 08/30/2011    Per colonoscopy  . Peripheral neuropathy 08/30/2011  . EKG abnormalities February 2013    Lateral inverted T waves  . 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 Subdural hematoma 08/2012    Status post fall  . Dysphagia, pharyngoesophageal phase 08/16/2012  . Type II or unspecified type diabetes mellitus with neurological manifestations, not stated as uncontrolled(250.60)   . Acute left PCA stroke 08/16/2012   Medications: Scheduled:  . feeding supplement  30 mL Oral TID WC  . feeding supplement  1 Container Oral TID BM  . insulin aspart  0-15 Units Subcutaneous TID WC  . pantoprazole  40 mg Oral BID  . [COMPLETED] potassium chloride  10 mEq Intravenous Q1 Hr x 4  . sodium chloride  3 mL Intravenous Q12H  . Warfarin - Pharmacist Dosing Inpatient   Does not apply Q24H  . [DISCONTINUED] warfarin  4 mg Oral Once   Assessment: Patient with (+)PE per CT on day#3 heparin --> warfarin overlap.  She had an IVC filter placed on 4/8.   MD note reviewed for explanation of risk vs benefits of  anticoagulating this patient with large clot burden & recent ICH.  Cautiously initiating warfarin.   No bleeding noted but H/H dropping and concern for bleed.  Hg improved after PRBCs.  Coumadin & Heparin have temporarily been stopped while work-up for GI bleed underway.  Discussed with Dr Kerry Hough -plan to hold anticoagulation another 24hrs and re-evaluate tomorrow.   Goal of Therapy:  Heparin level 0.3-0.7 units/ml Monitor platelets by anticoagulation protocol: Yes INR 2-3   Plan:  Hold warfarin & heparin per Dr Kerry Hough F/U plans to resume anti-coagulation   Elson Clan 10/11/2012,12:09 PM

## 2012-10-12 DIAGNOSIS — M629 Disorder of muscle, unspecified: Secondary | ICD-10-CM

## 2012-10-12 DIAGNOSIS — M6289 Other specified disorders of muscle: Secondary | ICD-10-CM | POA: Diagnosis not present

## 2012-10-12 LAB — GLUCOSE, CAPILLARY
Glucose-Capillary: 88 mg/dL (ref 70–99)
Glucose-Capillary: 85 mg/dL (ref 70–99)
Glucose-Capillary: 124 mg/dL — ABNORMAL HIGH (ref 70–99)

## 2012-10-12 LAB — CBC
HCT: 29 % — ABNORMAL LOW (ref 36.0–46.0)
MCHC: 33.8 g/dL (ref 30.0–36.0)
MCV: 86.8 fL (ref 78.0–100.0)
Platelets: 212 10*3/uL (ref 150–400)
RDW: 15.1 % (ref 11.5–15.5)
WBC: 7.9 10*3/uL (ref 4.0–10.5)

## 2012-10-12 LAB — BASIC METABOLIC PANEL WITH GFR
BUN: 10 mg/dL (ref 6–23)
CO2: 25 meq/L (ref 19–32)
Calcium: 8.4 mg/dL (ref 8.4–10.5)
Chloride: 99 meq/L (ref 96–112)
Creatinine, Ser: 0.48 mg/dL — ABNORMAL LOW (ref 0.50–1.10)
GFR calc Af Amer: >90 mL/min
GFR calc non Af Amer: 86 mL/min — ABNORMAL LOW
Glucose, Bld: 87 mg/dL (ref 70–99)
Potassium: 2.9 meq/L — ABNORMAL LOW (ref 3.5–5.1)
Sodium: 132 meq/L — ABNORMAL LOW (ref 135–145)

## 2012-10-12 MED ORDER — POTASSIUM CHLORIDE 20 MEQ/15ML (10%) PO LIQD
40.0000 meq | ORAL | Status: AC
  Administered 2012-10-12 (×3): 40 meq via ORAL
  Filled 2012-10-12 (×3): qty 30

## 2012-10-12 MED ORDER — POTASSIUM CHLORIDE 10 MEQ/100ML IV SOLN
10.0000 meq | INTRAVENOUS | Status: DC
  Filled 2012-10-12: qty 600

## 2012-10-12 NOTE — Progress Notes (Signed)
LUE neuro assessment completed. Circumference was 21 inches at this time.

## 2012-10-12 NOTE — Progress Notes (Addendum)
[facility]  PM Progress Note   Results from CT today noted. Newly diagnosed hematoma/hemorrhage into the hip abductor muscle compartment (gluteus med. Min, TFL, piriformis)- L>R- explains pain. Source unknown, no femoral vein involvement, thigh pain present prior to IVC placement. Occult Trauma most likely.Worsening pain reflects decrease in HB, suspect intra-muscular bleed, probably self contained at this point-will monitor leg clinically and Hb. If continues to fall may need to consider FFP.   Neurovascular checks   Pain medications, elevate, bedrest, may need to apply gentle pressure to lateral buttock area.  Monitor Thigh circumference-may need to get further imaging of thigh.  No need for urgent eval, if worsens may need ortho to eval for compartment syndrome intervention.  Will probably need to stop anticoagulation indefinitely, will probably have significant affect on mortality, but recurrent bleeding, now muscle compartment hemorrhage which has created significant pain and suffering- probably not worth risks and further complications/hospitalizations.  Anderson Malta, DO

## 2012-10-12 NOTE — Progress Notes (Signed)
IV in R forearm patent, flushes well, good blood return.  Pt began crying and got tearful and complained of pain with KCL running in at even 77mL/hr with 1/2NS y-sited in running at 50ml/hr.  Stopped KCL run for now, MD has been paged.

## 2012-10-12 NOTE — Progress Notes (Signed)
Thigh circumference measured, 20.5in.

## 2012-10-12 NOTE — Progress Notes (Signed)
[facility] PROGRESS NOTE  [name] GNF:[id number] DOB: 02/05/1924 DOA: 10/05/2012 PCP: Colette Ribas, MD  Brief narrative: 79 year old woman presented from [facility] with acute chest pain, back pain. Found to be hypothermic hypotensive, tachycardic and hypoxic on room air. CT of the chest revealed bilateral pulmonary emboli with high clot burden. Received a total of 6 L of normal saline on admission. Required vasopressor for 5 hours. Started on IV heparin. Due to recent subdural hematoma and risk for bleeding, IVC filter was placed. Patient's lower extremity Dopplers were positive for right-sided DVT. She was continued on IV heparin and started on Coumadin. Unfortunately her hemoglobin started to decline. Anticoagulation was held on 4/11. She was transfused 2 units of PRBCs. Imaging revealed acute intramuscular hemorrhage in the left hip abductor muscle group. No retroperitoneal hematoma.  Assessment/Plan: 1. Acute bilateral pulmonary embolism: Extensive thrombus at the right and left main pulmonary artery bifurcations extending into the lobar arteries on both sides. Hemodynamically stable. Echocardiogram could not comment on RV strain. Was initially started on IV heparin and Coumadin. She is status post IVC filter placement on 10/07/12. With the extensive clot burden, it was felt that she would need to be on some sort of anticoagulation. I discussed this with her niece [name] and explained the risk of starting anticoagulation. Patient had been started on Coumadin.This was felt to be a better option than the newer anticoagulants such as Xarelto, since Coumadin can be easily reversed if necessary. Unfortunately, the patient can have a significant decline in hemoglobin. Imaging of the abdomen did not indicate any retroperitoneal hematoma, but did reveal acute hemorrhage in the left hip abductor muscle. It does not appear that she can safely be continued on anticoagulation. We'll continue to  monitor. 2. Acute intramuscular hemorrhage. Patient has not had any significant trauma to the area. There may be some occult trauma versus spontaneous bleeding. We'll need to do frequent measurements of left thigh to monitor for edema and development compartment syndrome. May need further evaluation with orthotic if this continues to get worse. Continue supportive measures with transfusions as needed. Her INR is currently normal, but if her hemoglobin continues to decline, may need to give FFP. 3. Acute hypoxic respiratory failure: Stable, minimal oxygen requirement. Secondary to pulmonary embolism.  4. Right acute lower extremity DVT: IVC filter placement she is no longer a candidate for anticoagulation due to recurrent bleeding. 5. Metabolic acidosis: Resolved. Secondary to lactic acidosis secondary to hypotension.  6. Abdominal tenderness generalized, worse in the epigastric region: Stable. Daughter reported pt has chronic abdominal pain due to diverticulitis.  7. Failure to thrive: Initially her niece reported that the patient was declining at the nursing home. Her by mouth intake has been minimal and she was not participating with physical therapy. She is essentially bed/chair bound. On further conversation today, her niece now reports that she feels the patient was doing better in therapy. She reports that regarding her diet, she did have good days and bad days. Regarding her functional status, she was starting to ambulate with a walker. Her niece wishes her to continue with physical therapy at a nursing home. 8. History of left PCA stroke 08/2012: Was on Aggrenox prior to that admission. Deficits included moderate aphasia, mild right-sided weakness and right homonymous hemianopsia. Patient was placed on aspirin and Plavix but this was discontinued secondary to fall and subdural hematoma 9. Subdural hematoma status post fall 08/2012: Repeat CT that admission demonstrated resolution. No new focal deficits  noted. 10. History of erosive gastritis, hiatal hernia, GI bleed: PPI. GI bleed 08/2011. Thought to be diverticular exacerbated by NSAIDs. 11. Diabetes mellitus type 2: Well controlled. Sliding-scale insulin. 12. Normocytic anemia: Patient's hemoglobin started to trend down.  She was transfused 2 units of PRBCs. We'll continue to monitor for further decline. Imaging revealed left intramuscular hemorrhage in the hip abductor muscles. 13. Dysphagia: Dysphagia 1. Pure. Her family now reports that patient was eating solid food at the nursing home prior to admission. She does not like the taste of the pure food and therefore is not eating. They are requesting that the patient be changed to regular diet. We will change to a regular diet and observe if the patient can tolerate this and if her by mouth intake increases. 14. Hypokalemia. Replace  Code Status: Limited code: No CPR, no intubation, no BiPAP. Yes to cardioversion or defibrillation for shockable rhythms, yes to pressors and antiarrhythmics IV Family Communication: Left voicemail for her niece (point of contact) Disposition Plan: will need to go to SNF once stable  Erick Blinks, MD  [facility]  Pager [phone] If 7PM-7AM, please contact night-coverage at [email or web address], password [facility] 10/12/2012, 12:29 PM  LOS: 7 days   Consultants:  None  Procedures:  2-D echocardiogram: Left ventricular ejection fraction probably normal. Regional wall motion could not be fully evaluated. Grade 1 diastolic dysfunction. Mild to moderate aortic stenosis. RV grossly normal in size. Function could not be evaluated.  HPI/Subjective: Pain in hip feels better. Denies any shortness of breath.  Objective: Filed Vitals:   10/11/12 0442 10/11/12 1407 10/11/12 2202 10/12/12 0654  BP: 150/65 159/65 134/42 132/84  Pulse: 71 73 72 87  Temp: 98.5 F (36.9 C) 97.9 F (36.6 C) 98 F (36.7 C) 98.1 F (36.7 C)  TempSrc: Oral Oral Oral Oral  Resp: 16 16   18   Height:      Weight:      SpO2: 100% 100% 99% 100%    Intake/Output Summary (Last 24 hours) at 10/12/12 1229 Last data filed at 10/12/12 0554  Gross per 24 hour  Intake    243 ml  Output      1 ml  Net    242 ml   Filed Weights   10/05/12 2235 10/06/12 0245 10/07/12 0500  Weight: 54.432 kg (120 lb) 57 kg (125 lb 10.6 oz) 65.1 kg (143 lb 8.3 oz)    Exam:  General: No signs of distress Eyes: Pupils, irises and lids appear normal ENT: grossly normal hearing, lips & tongue.  Cardiovascular: RRR, no m/r/g. No LE edema. Telemetry: SR, no arrhythmias  Respiratory: CTA bilaterally, no w/r/r. Normal respiratory effort. Abdomen: soft, nontender, nondistended. No bruising noted in abdomen or flanks noted. Musculoskeletal: Moves all extremities to command. She is weak on the left side from her prior stroke. There is bruising noted in the perineum and medial left thigh Psychiatric: grossly normal mood and affect, speech fluent and confused at times.  Neurologic: As above. Cranial nerves appear grossly intact.  Data Reviewed: Basic Metabolic Panel:  Recent Labs Lab 10/08/12 0425 10/08/12 9629 10/09/12 0601 10/10/12 0501 10/11/12 0706 10/12/12 0700 10/12/12 1058  NA 136  --  134* 136 134* 132*  --   K 2.7*  --  3.2* 3.1* 3.3* 2.9*  --   CL 102  --  100 101 99 99  --   CO2 23  --  22 24 24 25   --   GLUCOSE 93  --  116* 103* 93 87  --   BUN 4*  --  5* 12 12 10   --   CREATININE 0.54  --  0.60 0.56 0.49* 0.48*  --   CALCIUM 8.4  --  8.1* 8.3* 8.7 8.4  --   MG  --  1.2*  --   --   --   --  1.6   Liver Function Tests:  Recent Labs Lab 10/05/12 2027  AST 72*  ALT 27  ALKPHOS 57  BILITOT 0.3  PROT 5.9*  ALBUMIN 2.8*   CBC:  Recent Labs Lab 10/08/12 0425 10/09/12 0601 10/10/12 0501 10/11/12 0706 10/12/12 0700  WBC 5.8 6.3 8.4 10.6* 7.9  HGB 9.7* 8.2* 7.3* 10.9* 9.8*  HCT 29.2* 25.2* 22.6* 32.0* 29.0*  MCV 88.0 87.2 87.6 85.8 86.8  PLT 203 214 220 208 212    Cardiac Enzymes:  Recent Labs Lab 10/05/12 2027  TROPONINI <0.30     Recent Labs  10/05/12 2027  PROBNP 332.0   CBG:  Recent Labs Lab 10/11/12 2116 10/12/12 0039 10/12/12 0415 10/12/12 0754 10/12/12 1142  GLUCAP 104* 94 88 85 97    Studies: Ct Abdomen Pelvis Wo Contrast  10/11/2012  *RADIOLOGY REPORT*  Clinical Data: 79 year old female back pain hip pain on blood thinners.  Possible retroperitoneal hemorrhage.  Anemia.  CT ABDOMEN AND PELVIS WITHOUT CONTRAST  Technique:  Multidetector CT imaging of the abdomen and pelvis was performed following the standard protocol without intravenous contrast.  Comparison: CT abdomen and pelvis 10/05/2012.  Findings: Interval mildly increased right lower lobe posterior basal pulmonary opacity which is confluent, and there is a small right costophrenic sulcus pleural effusion.  No left pleural effusion or pericardial effusion.  Stable visualized osseous structures.  No retroperitoneal or intra-abdominal hemorrhage.  There is subcutaneous stranding about both flanks and hips greater on the left.  Interval marked enlargement of the left left at Dr. muscles of the left hip (especially abductor brevis).  See series 2 image 92.  The entire abductor muscle group is expanded, now at the 70 cm in thickness (previously 35 mm at comparable level).  The entire extent of muscle involvement is not included. There is some associated stranding/hematoma in the subcutaneous fat of the perineum.  Minor associated presacral stranding.  Otherwise no intrapelvic or retroperitoneal hematoma.  Gas within the rectum.  Gas and fluid within the bladder.  Foley catheter no longer present.  Sigmoid diverticulosis.  Much of the left colon is decompressed with scattered diverticula. Intermittent diverticula in the more proximal colon.  No dilated small bowel.  Gallbladder surgically absent.  Stable liver, spleen, pancreas, adrenal glands, and kidneys.  IVC filter now in place.  Extensive calcified atherosclerosis of the aorta, pelvic arteries, and the branches.  IMPRESSION: 1.  Acute intramuscular hemorrhage of the left hip abductor muscle group. 2. Mild surrounding subcutaneous hematoma including at the left perineum.  Minimal if any intrapelvic/presacral hematoma. No retroperitoneal hematoma. 3.  Small layering right pleural effusion is new.  Increased right lung base opacity favor atelectasis. 4.  Interval IVC filter placement. 5.  Foley catheter removed.  Residual gas within the bladder, favor catheter related.   Original Report Authenticated By: Erskine Speed, M.D.    Dg Hip Complete Left  10/11/2012  *RADIOLOGY REPORT*  Clinical Data: Left hip pain.  LEFT HIP - COMPLETE 2+ VIEW  Comparison: CT 10/05/2012 and 10/11/2012  Findings: Pelvic bony ring is intact.  Degenerative changes in the hips bilaterally.  No evidence for acute fracture or dislocation. The patient has an IVC filter.  Joint space narrowing along the medial and inferior left hip joint.  IMPRESSION: No acute bony abnormality in the pelvis or left hip.  Degenerative changes in both hips.   Original Report Authenticated By: Richarda Overlie, M.D.     Scheduled Meds: . feeding supplement  30 mL Oral TID WC  . feeding supplement  1 Container Oral TID BM  . insulin aspart  0-15 Units Subcutaneous TID WC  . pantoprazole  40 mg Oral BID  . potassium chloride  40 mEq Oral Q3H  . sodium chloride  3 mL Intravenous Q12H   Continuous Infusions: . sodium chloride 20 mL/hr at 10/11/12 1757    Principal Problem:   Acute pulmonary embolism Active Problems:   Anemia   Type II or unspecified type diabetes mellitus with neurological manifestations, not stated as uncontrolled(250.60)   CVA (cerebral infarction)   Subdural hematoma   Homonymous hemianopia   Dysphagia, pharyngoesophageal phase   Hypotension, unspecified   Acute respiratory failure with hypoxia   Metabolic acidosis   Adult failure to thrive   DVT, lower  extremity   Hemorrhage of muscle   Erick Blinks, MD  [facility] Pager [phone] If 7PM-7AM, please contact night-coverage at [email or web address], password [facility] 10/12/2012, 12:29 PM  LOS: 7 days   Time spent: 25 minutes

## 2012-10-13 LAB — CBC
HCT: 31.1 % — ABNORMAL LOW (ref 36.0–46.0)
Hemoglobin: 10.3 g/dL — ABNORMAL LOW (ref 12.0–15.0)
MCV: 87.4 fL (ref 78.0–100.0)
RBC: 3.56 MIL/uL — ABNORMAL LOW (ref 3.87–5.11)
WBC: 6.8 10*3/uL (ref 4.0–10.5)

## 2012-10-13 LAB — GLUCOSE, CAPILLARY
Glucose-Capillary: 101 mg/dL — ABNORMAL HIGH (ref 70–99)
Glucose-Capillary: 103 mg/dL — ABNORMAL HIGH (ref 70–99)
Glucose-Capillary: 109 mg/dL — ABNORMAL HIGH (ref 70–99)
Glucose-Capillary: 109 mg/dL — ABNORMAL HIGH (ref 70–99)
Glucose-Capillary: 110 mg/dL — ABNORMAL HIGH (ref 70–99)
Glucose-Capillary: 119 mg/dL — ABNORMAL HIGH (ref 70–99)

## 2012-10-13 LAB — BASIC METABOLIC PANEL
CO2: 25 mEq/L (ref 19–32)
Chloride: 103 mEq/L (ref 96–112)
Creatinine, Ser: 0.47 mg/dL — ABNORMAL LOW (ref 0.50–1.10)
Potassium: 3.9 mEq/L (ref 3.5–5.1)

## 2012-10-13 NOTE — Progress Notes (Signed)
Physical Therapy Treatment Patient Details Name: [name] MRN: [id number] DOB: 02/05/1924 Today's Date: 10/13/2012 Time: 4098-1191 PT Time Calculation (min): 48 min  PT Assessment / Plan / Recommendation Comments on Treatment Session  Pt appears depressed but willing to work with me.  She made significant improvement in overall mobilty, being able to transfer to EOB with min to mod assist and able to ambulate with a walker 10' with min assist.  Her endurance level needs to be built back up but is now making strides to do that.    Follow Up Recommendations        Does the patient have the potential to tolerate intense rehabilitation     Barriers to Discharge        Equipment Recommendations       Recommendations for Other Services    Frequency     Plan Discharge plan remains appropriate;Frequency remains appropriate    Precautions / Restrictions     Pertinent Vitals/Pain     Mobility  Bed Mobility Supine to Sit: 3: Mod assist;HOB elevated Sitting - Scoot to Edge of Bed: 4: Min assist Sit to Supine: Not Tested (comment) Transfers Sit to Stand: 3: Mod assist;With upper extremity assist Stand to Sit: 3: Mod assist;With upper extremity assist Ambulation/Gait Ambulation/Gait Assistance: 4: Min assist Ambulation Distance (Feet): 10 Feet Assistive device: Rolling walker Ambulation/Gait Assistance Details: pt is able to weight bear on LLE with no pain reported Gait Pattern: Step-through pattern;Trunk flexed;Decreased hip/knee flexion - left Gait velocity: very slow Stairs: No Wheelchair Mobility Wheelchair Mobility: No    Exercises General Exercises - Lower Extremity Ankle Circles/Pumps: AROM;Both;10 reps;Supine Short Arc Quad: AROM;Both;10 reps;Supine Heel Slides: AAROM;Both;10 reps;Supine Hip ABduction/ADduction: AAROM;Both;10 reps;Supine   PT Diagnosis:    PT Problem List:   PT Treatment Interventions:     PT Goals Acute Rehab PT Goals PT Goal: Rolling  Supine to Right Side - Progress: Met PT Goal: Rolling Supine to Left Side - Progress: Met Pt will go Supine/Side to Sit: with min assist PT Goal: Supine/Side to Sit - Progress: Updated due to goal met Pt will go Sit to Stand: with min assist PT Goal: Sit to Stand - Progress: Updated due to goal met Pt will go Stand to Sit: with min assist PT Goal: Stand to Sit - Progress: Updated due to goals met Pt will Ambulate: 16 - 50 feet;with min assist;with rolling walker PT Goal: Ambulate - Progress: Goal set today  Visit Information  Last PT Received On: 10/13/12    Subjective Data  Subjective: Pt states that she has not been up in a chair since last Friday   Cognition       Balance     End of Session PT - End of Session Equipment Utilized During Treatment: Gait belt Activity Tolerance: Patient limited by fatigue Patient left: in chair;with call bell/phone within reach;with chair alarm set Nurse Communication: Mobility status   GP     Konrad Penta 10/13/2012, 2:18 PM

## 2012-10-13 NOTE — Progress Notes (Signed)
Follow-up Nutrition Note INTERVENTION Continue:  ProStat 30 ml TID (each 30 ml provides 100 kcal, 15 gr protein) Resource Breeze po TID, each supplement provides 250 kcal and 9 grams of protein.  NUTRITION DIAGNOSIS: Malnutrition related to inadequate oral intake; ongoing  Goal: Pt to meet >/= 90% of their estimated nutrition needs; and optimize protein intake   Monitor:  Skin assessments,Po intake, labs and wt trends  Reason for Assessment: Low Braden Score=12  79 y.o. female  Admitting Dx: Acute pulmonary embolism  ASSESSMENT:  Pt poor po intake continues (0-25% meals). MD has upgraded diet to Heart Healthy per family request. Hopefully will help with oral intake. Continue to encourage protein modular and Breeze.  Height: Ht Readings from Last 1 Encounters:  10/06/12 5\' 6"  (1.676 m)    Weight: Wt Readings from Last 1 Encounters:  10/07/12 143 lb 8.3 oz (65.1 kg)    Ideal Body Weight: 130# (59 kg)  % Ideal Body Weight: 110%  Wt Readings from Last 10 Encounters:  10/07/12 143 lb 8.3 oz (65.1 kg)  08/19/12 147 lb 11.3 oz (67 kg)  08/28/11 132 lb 14.4 oz (60.283 kg)  08/28/11 132 lb 14.4 oz (60.283 kg)  08/28/11 132 lb 14.4 oz (60.283 kg)    Usual Body Weight: 120.8# on 10/03/12   BMI:  Body mass index is 23.18 kg/(m^2).WNL  Estimated Nutritional Needs: Kcal: 1400-1600 Protein: 72-83 gr Fluid: >1600 ml/day  Skin: No issues noted  Diet Order: Cardiac  EDUCATION NEEDS: -Education needs addressed   Intake/Output Summary (Last 24 hours) at 10/13/12 1139 Last data filed at 10/13/12 0559  Gross per 24 hour  Intake    240 ml  Output      0 ml  Net    240 ml    Last BM: 10/12/12  Labs:   Recent Labs Lab 10/08/12 0658  10/11/12 0706 10/12/12 0700 10/12/12 1058 10/13/12 0522  NA  --   < > 134* 132*  --  135  K  --   < > 3.3* 2.9*  --  3.9  CL  --   < > 99 99  --  103  CO2  --   < > 24 25  --  25  BUN  --   < > 12 10  --  10  CREATININE  --   <  > 0.49* 0.48*  --  0.47*  CALCIUM  --   < > 8.7 8.4  --  8.5  MG 1.2*  --   --   --  1.6  --   GLUCOSE  --   < > 93 87  --  103*  < > = values in this interval not displayed.  CBG (last 3)   Recent Labs  10/13/12 0014 10/13/12 0348 10/13/12 0847  GLUCAP 109* 109* 103*    Scheduled Meds: . feeding supplement  30 mL Oral TID WC  . feeding supplement  1 Container Oral TID BM  . insulin aspart  0-15 Units Subcutaneous TID WC  . pantoprazole  40 mg Oral BID  . sodium chloride  3 mL Intravenous Q12H    Continuous Infusions: . sodium chloride 20 mL/hr at 10/11/12 1757    Past Medical History  Diagnosis Date  . Stroke 2000 and 2002  . Hypertension   . Erosive gastritis 08/29/2011    Per EGD  . Hiatal hernia 08/29/2011    Per EGD  . Diverticulosis 08/30/2011    Per colonoscopy  .  Peripheral neuropathy 08/30/2011  . EKG abnormalities February 2013    Lateral inverted T waves  . 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 Subdural hematoma 08/2012    Status post fall  . Dysphagia, pharyngoesophageal phase 08/16/2012  . Type II or unspecified type diabetes mellitus with neurological manifestations, not stated as uncontrolled(250.60)   . Acute left PCA stroke 08/16/2012    Past Surgical History  Procedure Laterality Date  . Abdominal hysterectomy    . Tonsillectomy    . Cholecystectomy    . Appendectomy    . Esophagogastroduodenoscopy  08/28/2011    Procedure: ESOPHAGOGASTRODUODENOSCOPY (EGD);  Surgeon: Malissa Hippo, MD;  Location: [facility];  Service: Endoscopy;  Laterality: N/A;  . Colonoscopy  08/29/2011    Procedure: COLONOSCOPY;  Surgeon: Malissa Hippo, MD;  Location: [facility];  Service: Endoscopy;  Laterality: N/A;    Royann Shivers MS,RD,LDN,CSG Office: [phone] Pager: [phone]

## 2012-10-13 NOTE — Clinical Social Work Note (Signed)
[employer] denied SNF authorization for patient, CSW spoke w [facility] rep and was advised that MD can request peer to peer to discuss issue.    Santa Genera, LCSW Clinical Social Worker [phone])

## 2012-10-13 NOTE — Clinical Social Work Note (Signed)
CSW called Francine Graven, MD connected to appropriate [employer] staff to request peer to peer, MD left information for [employer] rep to contact him.  CSW received copy of denial letter from [facility].  Per [facility], patient had already been denied SNF placement while at prior SNF [facility]) - per [facility], patient was at [facility] 2/21 - 10/05/12.  SNF requested additional rehab days which were denied on 4/3, [facility] appealed denial, [employer] involved. CSW will await results of MD peer to peer review, family will be advised to initiate Medicaid application or pay privately for SNF if [facility] will not authorize.  Santa Genera, LCSW Clinical Social Worker [phone])

## 2012-10-13 NOTE — Clinical Social Work Note (Signed)
Admissions at [facility] has declined patient.  [facility] informed of this, also encouraged to assist w patient's Medicaid application as soon as possible.  Advised that patient has bed offers at [facility] and [facility], family previously chose [facility].  [facility] was given updated PT notes to get to [facility] to give further information to reviewer.  Santa Genera, LCSW Clinical Social Worker [phone])

## 2012-10-13 NOTE — Progress Notes (Addendum)
Left upper thigh circumference measure 21.5 in, redness noted inner thigh, tender to touch.

## 2012-10-13 NOTE — Progress Notes (Signed)
[facility] PROGRESS NOTE  MAANYA HIPPERT NWG:[id number] DOB: 02/05/1924 DOA: 10/05/2012 PCP: Colette Ribas, MD  Brief narrative: 79 year old woman presented from [facility] with acute chest pain, back pain. Found to be hypothermic hypotensive, tachycardic and hypoxic on room air. CT of the chest revealed bilateral pulmonary emboli with high clot burden. Received a total of 6 L of normal saline on admission. Required vasopressor for 5 hours. Started on IV heparin. Due to recent subdural hematoma and risk for bleeding, IVC filter was placed. Patient's lower extremity Dopplers were positive for right-sided DVT. She was continued on IV heparin and started on Coumadin. Unfortunately her hemoglobin started to decline. Anticoagulation was held on 4/11. She was transfused 2 units of PRBCs. Imaging revealed acute intramuscular hemorrhage in the left hip abductor muscle group. No retroperitoneal hematoma.  Assessment/Plan: 1. Acute bilateral pulmonary embolism: Extensive thrombus at the right and left main pulmonary artery bifurcations extending into the lobar arteries on both sides. Hemodynamically stable. Echocardiogram could not comment on RV strain. Was initially started on IV heparin and Coumadin. She is status post IVC filter placement on 10/07/12. With the extensive clot burden, it was felt that she would need to be on some sort of anticoagulation. I discussed this with her niece [name] and explained the risk of starting anticoagulation. Patient had been started on Coumadin.This was felt to be a better option than the newer anticoagulants such as Xarelto, since Coumadin can be easily reversed if necessary. Unfortunately, the patient can have a significant decline in hemoglobin. Imaging of the abdomen did not indicate any retroperitoneal hematoma, but did reveal acute hemorrhage in the left hip abductor muscle. It does not appear that she can safely be continued on anticoagulation. We'll continue to  monitor. 2. Acute intramuscular hemorrhage. Patient has not had any significant trauma to the area. There may be some occult trauma versus spontaneous bleeding. Hemoglobin has stabilized. She has not required any further transfusions of PRBCs. Edema of the left thigh has not significantly worsened. 3. Acute hypoxic respiratory failure: Stable, minimal oxygen requirement. Secondary to pulmonary embolism.  4. Right acute lower extremity DVT: IVC filter placement she is no longer a candidate for anticoagulation due to recurrent bleeding. 5. Metabolic acidosis: Resolved. Secondary to lactic acidosis secondary to hypotension.  6. Abdominal tenderness generalized, worse in the epigastric region: Stable. Daughter reported pt has chronic abdominal pain due to diverticulitis.  7. Failure to thrive: Initially her niece reported that the patient was declining at the nursing home. Her by mouth intake has been minimal and she was not participating with physical therapy. She is essentially bed/chair bound. On further conversation, her niece now reports that she feels the patient was doing better in therapy. She reports that regarding her diet, she did have good days and bad days. Regarding her functional status, she was starting to ambulate with a walker. Her niece wishes her to continue with physical therapy at a nursing home. 8. History of left PCA stroke 08/2012: Was on Aggrenox prior to that admission. Deficits included moderate aphasia, mild right-sided weakness and right homonymous hemianopsia. Patient was placed on aspirin and Plavix but this was discontinued secondary to fall and subdural hematoma 9. Subdural hematoma status post fall 08/2012: Repeat CT that admission demonstrated resolution. No new focal deficits noted. 10. History of erosive gastritis, hiatal hernia, GI bleed: PPI. GI bleed 08/2011. Thought to be diverticular exacerbated by NSAIDs. 11. Diabetes mellitus type 2: Well controlled. Sliding-scale  insulin. 12. Normocytic anemia:  Patient's hemoglobin started to trend down.  She was transfused 2 units of PRBCs. We'll continue to monitor for further decline. Imaging revealed left intramuscular hemorrhage in the hip abductor muscles. 13. Dysphagia: The patient was initially started on a pured diet. Her by mouth intake was very poor. Her family reports that she did not like the consistency and would do better with a solid diet. This has been changed to regular diet. Her by mouth intake still remains poor. 14. Hypokalemia. Replace  Code Status: Limited code: No CPR, no intubation, no BiPAP. Yes to cardioversion or defibrillation for shockable rhythms, yes to pressors and antiarrhythmics IV Family Communication: Discussed with niece (point of contact) Disposition Plan: will need to go to SNF once stable, can probably discharge tomorrow if hemoglobin stable.  Erick Blinks, MD  [facility]  Pager [phone] If 7PM-7AM, please contact night-coverage at [email or web address], password [facility] 10/13/2012, 6:23 PM  LOS: 8 days   Consultants:  None  Procedures:  2-D echocardiogram: Left ventricular ejection fraction probably normal. Regional wall motion could not be fully evaluated. Grade 1 diastolic dysfunction. Mild to moderate aortic stenosis. RV grossly normal in size. Function could not be evaluated.  HPI/Subjective: Ambulated with physical therapy today. Pain in hip is better. No new complaints.  Objective: Filed Vitals:   10/12/12 1400 10/12/12 2253 10/13/12 0554 10/13/12 1400  BP: 136/80 126/62 156/49 169/66  Pulse: 89 68 77 72  Temp: 98.4 F (36.9 C) 98.3 F (36.8 C) 98.3 F (36.8 C) 98 F (36.7 C)  TempSrc: Oral  Oral Oral  Resp: 18 18 14 16   Height:      Weight:      SpO2: 96% 94% 98% 97%    Intake/Output Summary (Last 24 hours) at 10/13/12 1823 Last data filed at 10/13/12 1815  Gross per 24 hour  Intake    340 ml  Output      0 ml  Net    340 ml   Filed Weights    10/05/12 2235 10/06/12 0245 10/07/12 0500  Weight: 54.432 kg (120 lb) 57 kg (125 lb 10.6 oz) 65.1 kg (143 lb 8.3 oz)    Exam:  General: No signs of distress Eyes: Pupils, irises and lids appear normal ENT: grossly normal hearing, lips & tongue.  Cardiovascular: RRR, no m/r/g. No LE edema. Telemetry: SR, no arrhythmias  Respiratory: CTA bilaterally, no w/r/r. Normal respiratory effort. Abdomen: soft, nontender, nondistended. No bruising noted in abdomen or flanks noted. Musculoskeletal: Moves all extremities to command. She is weak on the left side from her prior stroke. There is bruising noted in the perineum and medial left thigh Psychiatric: grossly normal mood and affect, speech fluent and confused at times.  Neurologic: As above. Cranial nerves appear grossly intact.  Data Reviewed: Basic Metabolic Panel:  Recent Labs Lab 10/08/12 0658 10/09/12 0601 10/10/12 0501 10/11/12 0706 10/12/12 0700 10/12/12 1058 10/13/12 0522  NA  --  134* 136 134* 132*  --  135  K  --  3.2* 3.1* 3.3* 2.9*  --  3.9  CL  --  100 101 99 99  --  103  CO2  --  22 24 24 25   --  25  GLUCOSE  --  116* 103* 93 87  --  103*  BUN  --  5* 12 12 10   --  10  CREATININE  --  0.60 0.56 0.49* 0.48*  --  0.47*  CALCIUM  --  8.1* 8.3* 8.7 8.4  --  8.5  MG 1.2*  --   --   --   --  1.6  --    Liver Function Tests: No results found for this basename: AST, ALT, ALKPHOS, BILITOT, PROT, ALBUMIN,  in the last 168 hours CBC:  Recent Labs Lab 10/09/12 0601 10/10/12 0501 10/11/12 0706 10/12/12 0700 10/13/12 0522  WBC 6.3 8.4 10.6* 7.9 6.8  HGB 8.2* 7.3* 10.9* 9.8* 10.3*  HCT 25.2* 22.6* 32.0* 29.0* 31.1*  MCV 87.2 87.6 85.8 86.8 87.4  PLT 214 220 208 212 227   Cardiac Enzymes: No results found for this basename: CKTOTAL, CKMB, CKMBINDEX, TROPONINI,  in the last 168 hours   Recent Labs  10/05/12 2027  PROBNP 332.0   CBG:  Recent Labs Lab 10/12/12 2002 10/13/12 0014 10/13/12 0348 10/13/12 0847  10/13/12 1252  GLUCAP 117* 109* 109* 103* 110*    Studies: No results found.  Scheduled Meds: . feeding supplement  30 mL Oral TID WC  . feeding supplement  1 Container Oral TID BM  . insulin aspart  0-15 Units Subcutaneous TID WC  . pantoprazole  40 mg Oral BID  . sodium chloride  3 mL Intravenous Q12H   Continuous Infusions: . sodium chloride 20 mL/hr at 10/11/12 1757    Principal Problem:   Acute pulmonary embolism Active Problems:   Anemia   Type II or unspecified type diabetes mellitus with neurological manifestations, not stated as uncontrolled(250.60)   CVA (cerebral infarction)   Subdural hematoma   Homonymous hemianopia   Dysphagia, pharyngoesophageal phase   Hypotension, unspecified   Acute respiratory failure with hypoxia   Metabolic acidosis   Adult failure to thrive   DVT, lower extremity   Hemorrhage of muscle   Erick Blinks, MD  [facility] Pager [phone] If 7PM-7AM, please contact night-coverage at [email or web address], password [email or web address] 10/13/2012, 6:23 PM  LOS: 8 days   Time spent: 25 minutes

## 2012-10-13 NOTE — Progress Notes (Signed)
Left thigh circumference measured 20 inches.

## 2012-10-14 LAB — CBC
Hemoglobin: 10.1 g/dL — ABNORMAL LOW (ref 12.0–15.0)
Platelets: 238 10*3/uL (ref 150–400)
RBC: 3.51 MIL/uL — ABNORMAL LOW (ref 3.87–5.11)

## 2012-10-14 LAB — BASIC METABOLIC PANEL
Calcium: 8.7 mg/dL (ref 8.4–10.5)
GFR calc Af Amer: >90 mL/min (ref >90)
GFR calc non Af Amer: 88 mL/min — ABNORMAL LOW (ref >90)
Potassium: 3.2 mEq/L — ABNORMAL LOW (ref 3.5–5.1)
Sodium: 134 mEq/L — ABNORMAL LOW (ref 135–145)

## 2012-10-14 LAB — GLUCOSE, CAPILLARY: Glucose-Capillary: 111 mg/dL — ABNORMAL HIGH (ref 70–99)

## 2012-10-14 MED ORDER — MORPHINE SULFATE (CONCENTRATE) 10 MG /0.5 ML PO SOLN: 10.0000 mg | ORAL | Status: DC | PRN

## 2012-10-14 NOTE — Progress Notes (Signed)
Patient discharged to [facility],report called,and given to [facility]. Transported via EMS. Family aware. Vital signs stable.

## 2012-10-14 NOTE — Discharge Summary (Signed)
Physician Discharge Summary  [name] ZOX:[id number] DOB: 02/05/1924 DOA: 10/05/2012  PCP: Colette Ribas, MD  Admit date: 10/05/2012 Discharge date: 10/14/2012  Time spent: 50 minutes  Recommendations for Outpatient Follow-up:  1. Patient be discharged skilled nursing facility for physical therapy. 2. If the patient fails to improve/continues to decline, would recommend hospice/comfort care  Discharge Diagnoses:  Principal Problem:   Acute pulmonary embolism Active Problems:   Anemia   Type II or unspecified type diabetes mellitus with neurological manifestations, not stated as uncontrolled(250.60)   CVA (cerebral infarction)   Subdural hematoma   Homonymous hemianopia   Dysphagia, pharyngoesophageal phase   Hypotension, unspecified   Acute respiratory failure with hypoxia   Metabolic acidosis   Adult failure to thrive   DVT, lower extremity   Hemorrhage of muscle   Discharge Condition: Stable  Diet recommendation: Heart healthy diet  Filed Weights   10/05/12 2235 10/06/12 0245 10/07/12 0500  Weight: 54.432 kg (120 lb) 57 kg (125 lb 10.6 oz) 65.1 kg (143 lb 8.3 oz)    History of present illness:  [name] is a 79 y.o. female with a PMH significant for hypertension, diabetes, erosive esophagitis and multiple prior strokes who was brought into the emergency department from [facility] nursing home with acute onset chest pain. She was not on any antiplatelet therapy due to prior GI bleeding and subdural hematoma on her last admission in February of 2014. Upon arrival in the emergency department she was hypotensive with her blood pressure systolically in the 60s and an oxygen saturation of 79%. Her blood pressure responded to IV fluids and her oxygen saturations came up above 90 with nonrebreather oxygen. A CT of her chest was performed and revealed very large bilateral pulmonary emboli. [facility] was requested for further evaluation and management. The patient is  unable to carry out a full conversation she is able to answer simple yes no questions, her granddaughter is at the bedside and states that earlier today the patient was at her baseline.   Hospital Course:  This 79 year old female presented from the nursing center with acute chest pain, back pain. She said that be hypotensive, tachycardic and hypothermic and hypoxic on room. Workup revealed bilateral pulmonary emboli with high clot burden. She was started on IV heparin as well as required vasopressor therapy to maintain blood pressure. Fortunately, her condition began to stabilize and she was weaned off pressors. Lower extremity Dopplers were positive for right-sided DVT. IVC filter was placed by interventional radiology on 4/8. Patient has a history of GI bleeding as well as a had a subdural hematoma after a fall in 2/14. Patient will certainly be a higher risk for bleeding with long-term anticoagulation, but due to significant clot burden and respiratory/hemodynamic instability, it felt that the benefits of anticoagulation would outweigh risks. This was discussed with her family who agree to start Coumadin. Patient was started on Coumadin unfortunately began to have a declining hemoglobin. She does not have any evidence of GI bleeding. Anticoagulation was stopped and she was transfused 2 units of PRBCs. Further imaging revealed acute hemorrhage in her left hip abductor muscles. At this point it was felt that patient was no longer a candidate for anticoagulation. The risks of life-threatening bleeding would be too high. Her family understands this. Serial measurements of the patient's thigh circumference were measured and did not show any significant worsening. Her hemoglobin has been stable and has not required any further transfusions. She does have significant pain  in her left hip from bleeding. This is being treated with morphine.  Patient has been evaluated by physical therapy and recommendation a skilled  nursing facility placement for rehabilitation. Her functional status is very poor and her by mouth intake has been minimal. She's had decreased by mouth intake since her stroke in 2/14. Her long-term prognosis appears to be poor. At this point, I feel it would be reasonable for the patient to be discharged to a skilled nursing facility for physical therapy. If the patient does not improve/continues to decline, I would strongly recommend that care be transition towards hospice/comfort care. The family is agreeable to this plan. She's been made DO NOT RESUSCITATE.  Procedures:  IVC filter placement 10/07/12 2D echo: - Left ventricle: Overall LVEF is probably normal. Cannot fully evaluate regional wall motion as endocardium is difficult to visualize. Posterior wall appears hypokinetic. The cavity size was normal. Doppler parameters are consistent with abnormal left ventricular relaxation (grade 1 diastolic dysfunction). - Aortic valve: AV may be functionally bicuspid. Peak and mean gradients through the AV are 26 and 17 mm Hg respectively consistent with mild aortic stenosis. Visually, the stenosis appears to be moderate. - Right ventricle: RV is grossly normal in size. Cannot evaluate function.     Consultations:  none  Discharge Exam: Filed Vitals:   10/13/12 1400 10/13/12 2108 10/14/12 0537 10/14/12 1028  BP: 169/66 121/61 133/70   Pulse: 72 71 70   Temp: 98 F (36.7 C) 97.9 F (36.6 C) 98.1 F (36.7 C)   TempSrc: Oral Oral Oral   Resp: 16 18 18    Height:      Weight:      SpO2: 97% 98% 99% 99%    General: NAD Cardiovascular: S1, S2 RRR Respiratory: CTA B  Discharge Instructions  Discharge Orders   Future Orders Complete By Expires     Diet - low sodium heart healthy  As directed     Increase activity slowly  As directed         Medication List    STOP taking these medications       multivitamin with minerals Tabs     ramipril 5 MG capsule  Commonly known  as:  ALTACE      TAKE these medications       FLUoxetine 20 MG/5ML solution  Commonly known as:  PROZAC  Take 20 mg by mouth daily.     methylphenidate 5 MG tablet  Commonly known as:  RITALIN  Take 1 tablet by mouth twice a day in the morning and at noon     morphine CONCENTRATE 10 mg / 0.5 ml concentrated solution  Take 0.5 mLs (10 mg total) by mouth every 4 (four) hours as needed for pain.     omeprazole 20 MG capsule  Commonly known as:  PRILOSEC  Take 20 mg by mouth daily at 12 noon.     potassium chloride 10 MEQ CR capsule  Commonly known as:  MICRO-K  Take 30 mEq by mouth 2 (two) times daily with a meal.     simvastatin 20 MG tablet  Commonly known as:  ZOCOR  Take 1 tablet (20 mg total) by mouth daily at 6 PM.     UNABLE TO FIND  Take by mouth daily. Med Name: MAGIC CUP--Taken daily with lunch to support weight maintenance per nutritional support protocol     UNABLE TO FIND  Take 120 mLs by mouth 2 (two) times daily. Med Name: Kathi Der  SHAKE( 4 ounces (oz) of vanilla added to each shake)          The results of significant diagnostics from this hospitalization (including imaging, microbiology, ancillary and laboratory) are listed below for reference.    Significant Diagnostic Studies: Ct Abdomen Pelvis Wo Contrast  10/11/2012  *RADIOLOGY REPORT*  Clinical Data: 79 year old female back pain hip pain on blood thinners.  Possible retroperitoneal hemorrhage.  Anemia.  CT ABDOMEN AND PELVIS WITHOUT CONTRAST  Technique:  Multidetector CT imaging of the abdomen and pelvis was performed following the standard protocol without intravenous contrast.  Comparison: CT abdomen and pelvis 10/05/2012.  Findings: Interval mildly increased right lower lobe posterior basal pulmonary opacity which is confluent, and there is a small right costophrenic sulcus pleural effusion.  No left pleural effusion or pericardial effusion.  Stable visualized osseous structures.  No retroperitoneal or  intra-abdominal hemorrhage.  There is subcutaneous stranding about both flanks and hips greater on the left.  Interval marked enlargement of the left left at Dr. muscles of the left hip (especially abductor brevis).  See series 2 image 92.  The entire abductor muscle group is expanded, now at the 70 cm in thickness (previously 35 mm at comparable level).  The entire extent of muscle involvement is not included. There is some associated stranding/hematoma in the subcutaneous fat of the perineum.  Minor associated presacral stranding.  Otherwise no intrapelvic or retroperitoneal hematoma.  Gas within the rectum.  Gas and fluid within the bladder.  Foley catheter no longer present.  Sigmoid diverticulosis.  Much of the left colon is decompressed with scattered diverticula. Intermittent diverticula in the more proximal colon.  No dilated small bowel.  Gallbladder surgically absent.  Stable liver, spleen, pancreas, adrenal glands, and kidneys.  IVC filter now in place. Extensive calcified atherosclerosis of the aorta, pelvic arteries, and the branches.  IMPRESSION: 1.  Acute intramuscular hemorrhage of the left hip abductor muscle group. 2. Mild surrounding subcutaneous hematoma including at the left perineum.  Minimal if any intrapelvic/presacral hematoma. No retroperitoneal hematoma. 3.  Small layering right pleural effusion is new.  Increased right lung base opacity favor atelectasis. 4.  Interval IVC filter placement. 5.  Foley catheter removed.  Residual gas within the bladder, favor catheter related.   Original Report Authenticated By: Erskine Speed, M.D.    Dg Hip Complete Left  10/11/2012  *RADIOLOGY REPORT*  Clinical Data: Left hip pain.  LEFT HIP - COMPLETE 2+ VIEW  Comparison: CT 10/05/2012 and 10/11/2012  Findings: Pelvic bony ring is intact.  Degenerative changes in the hips bilaterally.  No evidence for acute fracture or dislocation. The patient has an IVC filter.  Joint space narrowing along the medial  and inferior left hip joint.  IMPRESSION: No acute bony abnormality in the pelvis or left hip.  Degenerative changes in both hips.   Original Report Authenticated By: Richarda Overlie, M.D.    Ct Head Wo Contrast  09/18/2012  *RADIOLOGY REPORT*  Clinical Data: Follow-up subdural hematoma.  No current complaints. History of hypertension.  History of diabetes.  CT HEAD WITHOUT CONTRAST  Technique:  Contiguous axial images were obtained from the base of the skull through the vertex without contrast.  Comparison: Most recent CT 02/18/ 2014.  Most recent MR 08/18/2012.  Findings: The previously identified left occipital and left thalamic infarcts continued to evolve, now displaying resolution of cytotoxic edema and early encephalomalacia.  There is no evidence for hemorrhagic transformation.  The previously identified 4 mm thick  right parietal convexity subdural hematoma seen best on MRI from 08/18/2012, is no longer visible.  Moderate age related atrophy and chronic microvascular ischemic change consistent with the patient's age of 52, stable.  Advanced vascular calcification in the vertebrals, basilar, and carotid arteries.  No CT signs of proximal vascular thrombosis.  Remote left thalamic lacunar infarct.  The calvarium is intact.  There is no acute sinus or mastoid disease.  Bilateral cataract extraction.  IMPRESSION: The previously identified 4 mm thick right parietal convexity subdural hematoma is no longer visible.  Continued evolution of now subacute to chronic left PCA territory infarct affecting the left occipital lobe and thalamus, without hemorrhagic transformation.   Original Report Authenticated By: Davonna Belling, M.D.    Ct Angio Chest Pe W/cm &/or Wo Cm  10/05/2012  **ADDENDUM** CREATED: 10/05/2012 22:12:10  Critical Value/emergent results were called by telephone at the time of interpretation on 10/05/2012 at 10:10 p.m. to Dr. Estell Harpin, who verbally acknowledged these results.  **END ADDENDUM** SIGNED BY:  Chauncey Fischer, M.D.   10/05/2012  *RADIOLOGY REPORT*  Clinical Data:  Chest pain.  Lower mid to upper back pain.  CT ANGIOGRAPHY CHEST AND ABDOMEN  Technique:  Multidetector CT imaging of the chest and abdomen was performed using the standard protocol during bolus administration of intravenous contrast.  Multiplanar CT image reconstructions including MIPs were obtained to evaluate the vascular anatomy.  Contrast: OMNIPAQUE IOHEXOL 350 MG/ML SOLN  Comparison:  Chest x-ray 10/05/2012.  CT abdomen and pelvis 05/30/2009.  CTA CHEST  Findings:  Large pulmonary emboli are evident at the bifurcations of the right than the left main pulmonary artery extending into the lobar arteries bilaterally.  Thrombotic burden is symmetric.  The thoracic aorta is tortuous.  Atherosclerotic calcifications are present within the thoracic aorta.  A standard three-vessel arch configuration is present.  There is no significant aneurysm or dissection.  There is no significant stenosis of the origins of the great vessels.  The heart size is normal.  No significant pleural or pericardial effusion is evident.  Mild bronchiectasis is evident bilaterally.  Minimal dependent atelectasis is worse on the right.  No focal nodule, mass, or airspace disease is present.  The bone windows are unremarkable. The noncontrast study demonstrates no displaced calcifications.   Review of the MIP images confirms the above findings.  IMPRESSION:  1.  Prominent bilateral pulmonary emboli with extensive thrombus at the right and left main pulmonary artery bifurcations extending into the lobar arteries on both sides. 2.  Atherosclerotic changes in the aorta without evidence for acute dissection or aneurysm. 3.  Standard three-vessel arch configuration.  CTA ABDOMEN  Findings:  Atherosclerotic calcifications and mural plaque present within the abdominal aorta without aneurysm or dissection.  The iliac arteries are normal size but with extensive  atherosclerotic calcifications as well.  The celiac artery and superior mesenteric artery are patent.  The renal artery ostia are patent.  The inferior mesenteric artery is opacified.  The liver and spleen are within normal limits.  The stomach, duodenum, and pancreas are normal.  The common bile duct is within normal limits following cholecystectomy.  Adrenal glands are normal bilaterally.  The kidneys and ureters are unremarkable.  A Foley catheter is present within the urinary bladder.  The patient is status post hysterectomy.  The ovaries are not clearly visualized and may be surgically absent.  The rectosigmoid colon is mostly collapsed.  Scattered diverticular changes are present without focal inflammation to suggest acute diverticulitis.  The ascending colon is within normal limits.  The patient is status post cholecystectomy.  The small bowel is unremarkable.  No significant adenopathy or free fluid is present.  Bone windows demonstrates degenerative disc disease at L4-5 and multilevel facet degenerative change.  No focal lytic or blastic lesions are evident   Review of the MIP images confirms the above findings.  IMPRESSION:  1.  Atherosclerotic changes throughout the abdominal aorta branch vessels without aneurysm. 2.  Multilevel degenerative changes in the lumbar spine.   Original Report Authenticated By: Marin Roberts, M.D.    Ir Ivc Filter Plmt / S&i /img Guid/mod Sed  10/07/2012  *RADIOLOGY REPORT*  Clinical Data:  Acute bilateral pulmonary embolism and residual right lower extremity DVT.  The patient is a poor candidate for long-term anticoagulation due to age, history of subdural hematoma and prior GI bleed.  1.  ULTRASOUND GUIDANCE FOR VASCULAR ACCESS OF THE RIGHT INTERNAL JUGULAR VEIN. 2.  IVC VENOGRAM 3.  PERCUTANEOUS IVC FILTER PLACEMENT  Sedation:  12.5 mcg IV Fentanyl.  Total Moderate Sedation Time:  15 minutes.  Contrast:  35 ml Omnipaque-300  Fluoroscopy Time: 1.7 minutes.  Procedure:   The procedure, risks, benefits, and alternatives were explained to the patient's daughter.  Questions regarding the procedure were encouraged and answered.  The patient's daughter consents to the procedure.  The right neck was prepped with Betadine in a sterile fashion, and a sterile drape was applied covering the operative field.  A sterile gown and sterile gloves were used for the procedure.  Local anesthesia was provided with 1% Lidocaine.  Under direct ultrasound guidance, a 21 gauge needle was advanced into the right internal jugular vein with ultrasound image documentation performed.  After securing access with a micropuncture dilator, a guidewire was advanced into the inferior vena cava.  A deployment sheath was advanced over the guidewire. This was utilized to perform IVC venography.  The deployment sheath was further positioned in an appropriate location for filter deployment.  A Bard Denali IVC filter was then advanced in the sheath.  This was then fully deployed in the infrarenal IVC.  Final filter position was confirmed with a fluoroscopic spot image.  Contrast injection was also performed through the sheath under fluoroscopy to confirm patency of the IVC at the level of the filter.  After the procedure the sheath was removed and hemostasis obtained with manual compression.  Complications: None  Findings:  IVC venography demonstrates a normal caliber IVC with no evidence of thrombus.  Renal veins are identified bilaterally.  The IVC filter was successfully positioned below the level of the renal veins and is appropriately oriented.  This IVC filter has both permanent and retrievable indications.  IMPRESSION:  Placement of percutaneous IVC filter in infrarenal IVC.  IVC venogram shows no evidence of IVC thrombus and normal caliber of the inferior vena cava.  This filter does have both permanent and retrievable indications.   Original Report Authenticated By: Irish Lack, M.D.    US Venous Img Lower  Bilateral  10/06/2012  *RADIOLOGY REPORT*  Clinical Data: Acute bilateral pulmonary embolism.  VENOUS DUPLEX ULTRASOUND OF BILATERAL LOWER EXTREMITIES  Technique:  Gray-scale sonography with graded compression, as well as color Doppler and duplex ultrasound, were performed to evaluate the deep venous system of both lower extremities from the level of the common femoral vein through the popliteal and proximal calf veins.  Spectral Doppler was utilized to evaluate flow at rest and with distal augmentation maneuvers.  Comparison:  None.  Findings:  Right lower extremity:  There is evidence of occlusive thrombus in the distal thigh segment of the femoral vein.  Contiguous thrombus extends into the proximal popliteal vein where some of the clot is nonocclusive.  No other DVT is identified on the right.  Left lower extremity:  No evidence of DVT in the left lower extremity.  IMPRESSION: Right lower extremity DVT at the level of the femoral vein in the distal thigh and proximal popliteal vein.  No DVT identified in the left lower extremity.   Original Report Authenticated By: Irish Lack, M.D.    Dg Chest Port 1 View  10/05/2012  *RADIOLOGY REPORT*  Clinical Data: Chest pain  PORTABLE CHEST - 1 VIEW  Comparison: 09/02/2012  Findings: Low lung volumes are present, causing crowding of the pulmonary vasculature.  Atherosclerotic calcification of the aortic arch noted. No cardiomegaly.  The patient is rotated to the left on today's exam, resulting in reduced diagnostic sensitivity and specificity.   The lungs appear clear.  IMPRESSION:  1.  No acute findings. 2.  Low lung volumes. 3.  Atherosclerosis.   Original Report Authenticated By: Gaylyn Rong, M.D.    Ct Cta Abd/pel W/cm &/or W/o Cm  10/05/2012  **ADDENDUM** CREATED: 10/05/2012 22:12:10  Critical Value/emergent results were called by telephone at the time of interpretation on 10/05/2012 at 10:10 p.m. to Dr. Estell Harpin, who verbally acknowledged these results.   **END ADDENDUM** SIGNED BY: Chauncey Fischer, M.D.   10/05/2012  *RADIOLOGY REPORT*  Clinical Data:  Chest pain.  Lower mid to upper back pain.  CT ANGIOGRAPHY CHEST AND ABDOMEN  Technique:  Multidetector CT imaging of the chest and abdomen was performed using the standard protocol during bolus administration of intravenous contrast.  Multiplanar CT image reconstructions including MIPs were obtained to evaluate the vascular anatomy.  Contrast: OMNIPAQUE IOHEXOL 350 MG/ML SOLN  Comparison:  Chest x-ray 10/05/2012.  CT abdomen and pelvis 05/30/2009.  CTA CHEST  Findings:  Large pulmonary emboli are evident at the bifurcations of the right than the left main pulmonary artery extending into the lobar arteries bilaterally.  Thrombotic burden is symmetric.  The thoracic aorta is tortuous.  Atherosclerotic calcifications are present within the thoracic aorta.  A standard three-vessel arch configuration is present.  There is no significant aneurysm or dissection.  There is no significant stenosis of the origins of the great vessels.  The heart size is normal.  No significant pleural or pericardial effusion is evident.  Mild bronchiectasis is evident bilaterally.  Minimal dependent atelectasis is worse on the right.  No focal nodule, mass, or airspace disease is present.  The bone windows are unremarkable. The noncontrast study demonstrates no displaced calcifications.   Review of the MIP images confirms the above findings.  IMPRESSION:  1.  Prominent bilateral pulmonary emboli with extensive thrombus at the right and left main pulmonary artery bifurcations extending into the lobar arteries on both sides. 2.  Atherosclerotic changes in the aorta without evidence for acute dissection or aneurysm. 3.  Standard three-vessel arch configuration.  CTA ABDOMEN  Findings:  Atherosclerotic calcifications and mural plaque present within the abdominal aorta without aneurysm or dissection.  The iliac arteries are normal size  but with extensive atherosclerotic calcifications as well.  The celiac artery and superior mesenteric artery are patent.  The renal artery ostia are patent.  The inferior mesenteric artery is opacified.  The liver and spleen are within normal limits.  The stomach, duodenum, and pancreas  are normal.  The common bile duct is within normal limits following cholecystectomy.  Adrenal glands are normal bilaterally.  The kidneys and ureters are unremarkable.  A Foley catheter is present within the urinary bladder.  The patient is status post hysterectomy.  The ovaries are not clearly visualized and may be surgically absent.  The rectosigmoid colon is mostly collapsed.  Scattered diverticular changes are present without focal inflammation to suggest acute diverticulitis.  The ascending colon is within normal limits.  The patient is status post cholecystectomy.  The small bowel is unremarkable.  No significant adenopathy or free fluid is present.  Bone windows demonstrates degenerative disc disease at L4-5 and multilevel facet degenerative change.  No focal lytic or blastic lesions are evident   Review of the MIP images confirms the above findings.  IMPRESSION:  1.  Atherosclerotic changes throughout the abdominal aorta branch vessels without aneurysm. 2.  Multilevel degenerative changes in the lumbar spine.   Original Report Authenticated By: Marin Roberts, M.D.     Microbiology: Recent Results (from the past 240 hour(s))  MRSA PCR SCREENING     Status: None   Collection Time    10/06/12  5:15 AM      Result Value Range Status   MRSA by PCR NEGATIVE  NEGATIVE Final   Comment:            The GeneXpert MRSA Assay (FDA     approved for NASAL specimens     only), is one component of a     comprehensive MRSA colonization     surveillance program. It is not     intended to diagnose MRSA     infection nor to guide or     monitor treatment for     MRSA infections.     Labs: Basic Metabolic  Panel:  Recent Labs Lab 10/08/12 0658  10/10/12 0501 10/11/12 0706 10/12/12 0700 10/12/12 1058 10/13/12 0522 10/14/12 0458  NA  --   < > 136 134* 132*  --  135 134*  K  --   < > 3.1* 3.3* 2.9*  --  3.9 3.2*  CL  --   < > 101 99 99  --  103 99  CO2  --   < > 24 24 25   --  25 25  GLUCOSE  --   < > 103* 93 87  --  103* 95  BUN  --   < > 12 12 10   --  10 9  CREATININE  --   < > 0.56 0.49* 0.48*  --  0.47* 0.45*  CALCIUM  --   < > 8.3* 8.7 8.4  --  8.5 8.7  MG 1.2*  --   --   --   --  1.6  --   --   < > = values in this interval not displayed. Liver Function Tests: No results found for this basename: AST, ALT, ALKPHOS, BILITOT, PROT, ALBUMIN,  in the last 168 hours No results found for this basename: LIPASE, AMYLASE,  in the last 168 hours No results found for this basename: AMMONIA,  in the last 168 hours CBC:  Recent Labs Lab 10/10/12 0501 10/11/12 0706 10/12/12 0700 10/13/12 0522 10/14/12 0458  WBC 8.4 10.6* 7.9 6.8 6.9  HGB 7.3* 10.9* 9.8* 10.3* 10.1*  HCT 22.6* 32.0* 29.0* 31.1* 30.9*  MCV 87.6 85.8 86.8 87.4 88.0  PLT 220 208 212 227 238   Cardiac Enzymes: No results found for  this basename: CKTOTAL, CKMB, CKMBINDEX, TROPONINI,  in the last 168 hours BNP: BNP (last 3 results)  Recent Labs  10/05/12 2027  PROBNP 332.0   CBG:  Recent Labs Lab 10/13/12 0847 10/13/12 1252 10/13/12 1811 10/13/12 2108 10/14/12 0746  GLUCAP 103* 110* 101* 119* 111*       Signed:  Maxwell Martorano  [facility] 10/14/2012, 11:18 AM

## 2012-10-14 NOTE — Clinical Social Work Note (Signed)
[facility] received SNF authorization from [facility] for patient.  [facility] is willing to take patient for SNF rehab.  E Courts notified by VM and asked to contact facility re preadmission paperwork requirements.  Also encouraged family to complete Medicaid application promptly.  Spoke w sister, Anderson Malta, conveyed this information, E Mordecai Maes stated that she cannot go to [facility] today and stated that E Courts has been designated to handle patient's affairs.    Santa Genera, LCSW Clinical Social Worker [phone])

## 2012-10-14 NOTE — Clinical Social Work Note (Signed)
Patient ready for discharge today, will go to [facility].  E Courts, niece, informed and agreeable to discharge planning.  Aware that [employer] authorized initial 7 days of rehab, family has Medicaid application in process and they are aware that they will need to follow up w DSS.  [facility] admissions agreeable to patient admission to their facility, discharge summary faxed via [facility], RN asked to call report, FL2 reviewed w RN and updated as necessary.  Discharge packet prepared and placed w shadow chart for transport.  Patient will transfer by EMS due to need for continued immobilization area with hemorrhage into abductor muscle, EMS asked to transport approx 2 PM today. CSW signing off as no further SW needs are identified.  Santa Genera, LCSW Clinical Social Worker [phone])

## 2012-10-14 NOTE — Progress Notes (Signed)
Patient's left thigh circumference is 20.5 inches.

## 2012-11-06 ENCOUNTER — Emergency Department (HOSPITAL_COMMUNITY): Payer: Medicare PPO

## 2012-11-06 ENCOUNTER — Inpatient Hospital Stay (HOSPITAL_COMMUNITY)
Admission: EM | Admit: 2012-11-06 | Discharge: 2012-11-07 | DRG: 640 | Disposition: A | Discharge status: Skilled Nursing Facility | Payer: Medicare PPO | Attending: Internal Medicine | Admitting: Internal Medicine

## 2012-11-06 ENCOUNTER — Encounter (HOSPITAL_COMMUNITY): Payer: Self-pay | Admitting: *Deleted

## 2012-11-06 DIAGNOSIS — Z86718 Personal history of other venous thrombosis and embolism: Secondary | ICD-10-CM

## 2012-11-06 DIAGNOSIS — M2420 Disorder of ligament, unspecified site: Secondary | ICD-10-CM | POA: Diagnosis present

## 2012-11-06 DIAGNOSIS — H53469 Homonymous bilateral field defects, unspecified side: Secondary | ICD-10-CM

## 2012-11-06 DIAGNOSIS — I82409 Acute embolism and thrombosis of unspecified deep veins of unspecified lower extremity: Secondary | ICD-10-CM | POA: Diagnosis present

## 2012-11-06 DIAGNOSIS — Z8673 Personal history of transient ischemic attack (TIA), and cerebral infarction without residual deficits: Secondary | ICD-10-CM

## 2012-11-06 DIAGNOSIS — G609 Hereditary and idiopathic neuropathy, unspecified: Secondary | ICD-10-CM | POA: Diagnosis present

## 2012-11-06 DIAGNOSIS — I82220 Acute embolism and thrombosis of inferior vena cava: Secondary | ICD-10-CM | POA: Diagnosis present

## 2012-11-06 DIAGNOSIS — K922 Gastrointestinal hemorrhage, unspecified: Secondary | ICD-10-CM

## 2012-11-06 DIAGNOSIS — R1314 Dysphagia, pharyngoesophageal phase: Secondary | ICD-10-CM | POA: Diagnosis present

## 2012-11-06 DIAGNOSIS — E872 Acidosis, unspecified: Secondary | ICD-10-CM

## 2012-11-06 DIAGNOSIS — J9601 Acute respiratory failure with hypoxia: Secondary | ICD-10-CM

## 2012-11-06 DIAGNOSIS — R4182 Altered mental status, unspecified: Secondary | ICD-10-CM | POA: Diagnosis present

## 2012-11-06 DIAGNOSIS — M6289 Other specified disorders of muscle: Secondary | ICD-10-CM | POA: Diagnosis present

## 2012-11-06 DIAGNOSIS — E875 Hyperkalemia: Secondary | ICD-10-CM

## 2012-11-06 DIAGNOSIS — R627 Adult failure to thrive: Secondary | ICD-10-CM | POA: Diagnosis present

## 2012-11-06 DIAGNOSIS — K5790 Diverticulosis of intestine, part unspecified, without perforation or abscess without bleeding: Secondary | ICD-10-CM

## 2012-11-06 DIAGNOSIS — K2960 Other gastritis without bleeding: Secondary | ICD-10-CM

## 2012-11-06 DIAGNOSIS — I639 Cerebral infarction, unspecified: Secondary | ICD-10-CM | POA: Diagnosis present

## 2012-11-06 DIAGNOSIS — Z79899 Other long term (current) drug therapy: Secondary | ICD-10-CM

## 2012-11-06 DIAGNOSIS — K449 Diaphragmatic hernia without obstruction or gangrene: Secondary | ICD-10-CM

## 2012-11-06 DIAGNOSIS — R3989 Other symptoms and signs involving the genitourinary system: Secondary | ICD-10-CM | POA: Diagnosis present

## 2012-11-06 DIAGNOSIS — I959 Hypotension, unspecified: Secondary | ICD-10-CM

## 2012-11-06 DIAGNOSIS — I2699 Other pulmonary embolism without acute cor pulmonale: Secondary | ICD-10-CM | POA: Diagnosis present

## 2012-11-06 DIAGNOSIS — I10 Essential (primary) hypertension: Secondary | ICD-10-CM

## 2012-11-06 DIAGNOSIS — N390 Urinary tract infection, site not specified: Secondary | ICD-10-CM

## 2012-11-06 DIAGNOSIS — G629 Polyneuropathy, unspecified: Secondary | ICD-10-CM

## 2012-11-06 DIAGNOSIS — R5381 Other malaise: Secondary | ICD-10-CM | POA: Diagnosis present

## 2012-11-06 DIAGNOSIS — E860 Dehydration: Principal | ICD-10-CM | POA: Diagnosis present

## 2012-11-06 DIAGNOSIS — R7989 Other specified abnormal findings of blood chemistry: Secondary | ICD-10-CM

## 2012-11-06 DIAGNOSIS — M629 Disorder of muscle, unspecified: Secondary | ICD-10-CM | POA: Diagnosis present

## 2012-11-06 DIAGNOSIS — E1149 Type 2 diabetes mellitus with other diabetic neurological complication: Secondary | ICD-10-CM | POA: Diagnosis present

## 2012-11-06 DIAGNOSIS — R109 Unspecified abdominal pain: Secondary | ICD-10-CM | POA: Diagnosis present

## 2012-11-06 DIAGNOSIS — D649 Anemia, unspecified: Secondary | ICD-10-CM | POA: Diagnosis present

## 2012-11-06 LAB — URINALYSIS, ROUTINE W REFLEX MICROSCOPIC
Glucose, UA: NEGATIVE mg/dL
Specific Gravity, Urine: >1.03 — ABNORMAL HIGH (ref 1.005–1.030)

## 2012-11-06 LAB — CBC WITH DIFFERENTIAL/PLATELET
Basophils Absolute: 0 10*3/uL (ref 0.0–0.1)
Basophils Relative: 0 % (ref 0–1)
Eosinophils Absolute: 0 10*3/uL (ref 0.0–0.7)
Eosinophils Relative: 0 % (ref 0–5)
HCT: 46.7 % — ABNORMAL HIGH (ref 36.0–46.0)
MCH: 28.7 pg (ref 26.0–34.0)
MCHC: 31.3 g/dL (ref 30.0–36.0)
Monocytes Absolute: 0.6 10*3/uL (ref 0.1–1.0)
Neutro Abs: 8.3 10*3/uL — ABNORMAL HIGH (ref 1.7–7.7)
RDW: 16.2 % — ABNORMAL HIGH (ref 11.5–15.5)

## 2012-11-06 LAB — URINE MICROSCOPIC-ADD ON

## 2012-11-06 LAB — COMPREHENSIVE METABOLIC PANEL
AST: 25 U/L (ref 0–37)
Albumin: 4.1 g/dL (ref 3.5–5.2)
BUN: 34 mg/dL — ABNORMAL HIGH (ref 6–23)
Calcium: 11.2 mg/dL — ABNORMAL HIGH (ref 8.4–10.5)
Creatinine, Ser: 0.72 mg/dL (ref 0.50–1.10)
Total Protein: 9.3 g/dL — ABNORMAL HIGH (ref 6.0–8.3)

## 2012-11-06 LAB — POTASSIUM: Potassium: 5.8 mEq/L — ABNORMAL HIGH (ref 3.5–5.1)

## 2012-11-06 MED ORDER — MORPHINE SULFATE (CONCENTRATE) 10 MG /0.5 ML PO SOLN: 10.0000 mg | ORAL | Status: DC | PRN

## 2012-11-06 MED ORDER — SODIUM CHLORIDE 0.9 % IV SOLN
Freq: Once | INTRAVENOUS | Status: AC
  Administered 2012-11-06: 13:00:00 via INTRAVENOUS

## 2012-11-06 MED ORDER — DEXTROSE 5 % IV SOLN
1.0000 g | Freq: Once | INTRAVENOUS | Status: AC
  Administered 2012-11-06: 1 g via INTRAVENOUS
  Filled 2012-11-06: qty 10

## 2012-11-06 MED ORDER — IOHEXOL 300 MG/ML  SOLN
50.0000 mL | Freq: Once | INTRAMUSCULAR | Status: AC | PRN
  Administered 2012-11-06: 50 mL via ORAL

## 2012-11-06 MED ORDER — INSULIN ASPART 100 UNIT/ML ~~LOC~~ SOLN: 0.0000 [IU] | Freq: Every day | SUBCUTANEOUS | Status: DC

## 2012-11-06 MED ORDER — SODIUM CHLORIDE 0.9 % IV SOLN: INTRAVENOUS | Status: DC

## 2012-11-06 MED ORDER — SODIUM POLYSTYRENE SULFONATE 15 GM/60ML PO SUSP
30.0000 g | Freq: Once | ORAL | Status: AC
  Administered 2012-11-06: 30 g via ORAL

## 2012-11-06 MED ORDER — MORPHINE SULFATE 2 MG/ML IJ SOLN: 1.0000 mg | INTRAMUSCULAR | Status: DC | PRN

## 2012-11-06 MED ORDER — IPRATROPIUM BROMIDE 0.02 % IN SOLN: 0.5000 mg | Freq: Four times a day (QID) | RESPIRATORY_TRACT | Status: DC | PRN

## 2012-11-06 MED ORDER — ONDANSETRON HCL 4 MG/2ML IJ SOLN: 4.0000 mg | Freq: Three times a day (TID) | INTRAMUSCULAR | Status: AC | PRN

## 2012-11-06 MED ORDER — FLUOXETINE HCL 20 MG PO CAPS
20.0000 mg | ORAL_CAPSULE | Freq: Every day | ORAL | Status: DC
  Administered 2012-11-07: 20 mg via ORAL
  Filled 2012-11-06: qty 1

## 2012-11-06 MED ORDER — SODIUM CHLORIDE 0.9 % IV SOLN: INTRAVENOUS | Status: AC

## 2012-11-06 MED ORDER — INSULIN ASPART 100 UNIT/ML ~~LOC~~ SOLN: 0.0000 [IU] | Freq: Three times a day (TID) | SUBCUTANEOUS | Status: DC

## 2012-11-06 MED ORDER — ALBUTEROL SULFATE (5 MG/ML) 0.5% IN NEBU: 2.5000 mg | INHALATION_SOLUTION | RESPIRATORY_TRACT | Status: DC | PRN

## 2012-11-06 NOTE — ED Provider Notes (Signed)
History     CSN: [id number]  Arrival date & time 11/06/12  1200   First MD Initiated Contact with Patient 11/06/12 1306      Chief Complaint  Patient presents with  . Altered Mental Status    (Consider location/radiation/quality/duration/timing/severity/associated sxs/prior treatment) Patient is a 79 y.o. female presenting with altered mental status. The history is provided by the nursing home. The history is limited by the condition of the patient (Dementia).  Altered Mental Status  She was sent here from nursing home because of confusion since last night. She was reported to have hypoxia with oxygen saturation in the 80s, and bradycardia with heart rate into the 30s. She is also been noted to have elevated blood pressure. The patient has severe dementia and is not able to give me any history. Family is here and states that they don't feel her mental state is significantly different from when she was discharged from hospital on April 15. She has not been eating well and she has been complaining of pain in the left side of her abdomen. There been no known fevers.  Past Medical History  Diagnosis Date  . Stroke 2000 and 2002  . Hypertension   . Erosive gastritis 08/29/2011    Per EGD  . Hiatal hernia 08/29/2011    Per EGD  . Diverticulosis 08/30/2011    Per colonoscopy  . Peripheral neuropathy 08/30/2011  . EKG abnormalities February 2013    Lateral inverted T waves  . 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 Subdural hematoma 08/2012    Status post fall  . Dysphagia, pharyngoesophageal phase 08/16/2012  . Type II or unspecified type diabetes mellitus with neurological manifestations, not stated as uncontrolled(250.60)   . Acute left PCA stroke 08/16/2012    Past Surgical History  Procedure Laterality Date  . Abdominal hysterectomy    . Tonsillectomy    . Cholecystectomy    . Appendectomy    . Esophagogastroduodenoscopy  08/28/2011    Procedure: ESOPHAGOGASTRODUODENOSCOPY (EGD);   Surgeon: Malissa Hippo, MD;  Location: [facility];  Service: Endoscopy;  Laterality: N/A;  . Colonoscopy  08/29/2011    Procedure: COLONOSCOPY;  Surgeon: Malissa Hippo, MD;  Location: [facility];  Service: Endoscopy;  Laterality: N/A;    No family history on file.  History  Substance Use Topics  . Smoking status: Never Smoker   . Smokeless tobacco: Not on file  . Alcohol Use: No    OB History   Grav Para Term Preterm Abortions TAB SAB Ect Mult Living                  Review of Systems  Unable to perform ROS: Dementia  Psychiatric/Behavioral: Positive for altered mental status.    Allergies  Review of patient's allergies indicates no known allergies.  Home Medications   Current Outpatient Rx  Name  Route  Sig  Dispense  Refill  . feeding supplement (RESOURCE BREEZE) LIQD   Oral   Take 1 Container by mouth 2 (two) times daily. Give 120 ml two times per day for supplement         . FLUoxetine (PROZAC) 20 MG capsule   Oral   Take 20 mg by mouth daily.         . methylphenidate (RITALIN) 5 MG tablet      Take 1 tablet by mouth twice a day in the morning and at noon   60 tablet   0   . omeprazole (  PRILOSEC OTC) 20 MG tablet   Oral   Take 20 mg by mouth daily.         . potassium chloride (MICRO-K) 10 MEQ CR capsule   Oral   Take 30 mEq by mouth 2 (two) times daily.          . simvastatin (ZOCOR) 20 MG tablet   Oral   Take 20 mg by mouth daily.         . Morphine Sulfate (MORPHINE CONCENTRATE) 10 mg / 0.5 ml concentrated solution   Oral   Take 0.5 mLs (10 mg total) by mouth every 4 (four) hours as needed for pain.   120 mL   0     BP 178/78  Pulse 92  Temp(Src) 98.3 F (36.8 C) (Oral)  Resp 18  Ht 5\' 4"  (1.626 m)  Wt 130 lb (58.968 kg)  BMI 22.3 kg/m2  SpO2 100%  Physical Exam  Nursing note and vitals reviewed.  79 year old female, resting comfortably and in no acute distress. Vital signs are significant for hypertension with  blood pressure 178/78. Oxygen saturation is 100%, which is normal. Head is normocephalic and atraumatic. PERRLA, EOMI. Oropharynx is clear. Neck is nontender and supple without adenopathy or JVD. Back is nontender and there is no CVA tenderness. Lungs are clear without rales, wheezes, or rhonchi. Chest is nontender. Heart has regular rate and rhythm without murmur. Abdomen is soft, flat, with moderate tenderness in the left side of the abdomen. There is no rebound or guarding. There are no masses or hepatosplenomegaly and peristalsis is normoactive. Extremities have no cyanosis or edema, full range of motion is present. Skin is warm and dry without rash. Neurologic: She is awake, alert, oriented to person but not place or time, cranial nerves are intact, there are no motor or sensory deficits.  ED Course  Procedures (including critical care time)  Results for orders placed during the hospital encounter of 11/06/12  CBC WITH DIFFERENTIAL      Result Value Range   WBC 10.5  4.0 - 10.5 K/uL   RBC 5.09  3.87 - 5.11 MIL/uL   Hemoglobin 14.6  12.0 - 15.0 g/dL   HCT 40.9 (*) 81.1 - 91.4 %   MCV 91.7  78.0 - 100.0 fL   MCH 28.7  26.0 - 34.0 pg   MCHC 31.3  30.0 - 36.0 g/dL   RDW 78.2 (*) 95.6 - 21.3 %   Platelets 187  150 - 400 K/uL   Neutrophils Relative 79 (*) 43 - 77 %   Neutro Abs 8.3 (*) 1.7 - 7.7 K/uL   Lymphocytes Relative 14  12 - 46 %   Lymphs Abs 1.5  0.7 - 4.0 K/uL   Monocytes Relative 6  3 - 12 %   Monocytes Absolute 0.6  0.1 - 1.0 K/uL   Eosinophils Relative 0  0 - 5 %   Eosinophils Absolute 0.0  0.0 - 0.7 K/uL   Basophils Relative 0  0 - 1 %   Basophils Absolute 0.0  0.0 - 0.1 K/uL  COMPREHENSIVE METABOLIC PANEL      Result Value Range   Sodium 145  135 - 145 mEq/L   Potassium 6.8 (*) 3.5 - 5.1 mEq/L   Chloride 112  96 - 112 mEq/L   CO2 18 (*) 19 - 32 mEq/L   Glucose, Bld 120 (*) 70 - 99 mg/dL   BUN 34 (*) 6 - 23 mg/dL   Creatinine,  Ser 0.72  0.50 - 1.10 mg/dL    Calcium 16.1 (*) 8.4 - 10.5 mg/dL   Total Protein 9.3 (*) 6.0 - 8.3 g/dL   Albumin 4.1  3.5 - 5.2 g/dL   AST 25  0 - 37 U/L   ALT 8  0 - 35 U/L   Alkaline Phosphatase 81  39 - 117 U/L   Total Bilirubin 0.7  0.3 - 1.2 mg/dL   GFR calc non Af Amer 76 (*) >90 mL/min   GFR calc Af Amer 88 (*) >90 mL/min  URINALYSIS, ROUTINE W REFLEX MICROSCOPIC      Result Value Range   Color, Urine YELLOW  YELLOW   APPearance CLEAR  CLEAR   Specific Gravity, Urine >1.030 (*) 1.005 - 1.030   pH 5.5  5.0 - 8.0   Glucose, UA NEGATIVE  NEGATIVE mg/dL   Hgb urine dipstick MODERATE (*) NEGATIVE   Bilirubin Urine SMALL (*) NEGATIVE   Ketones, ur 15 (*) NEGATIVE mg/dL   Protein, ur TRACE (*) NEGATIVE mg/dL   Urobilinogen, UA 0.2  0.0 - 1.0 mg/dL   Nitrite NEGATIVE  NEGATIVE   Leukocytes, UA MODERATE (*) NEGATIVE  URINE MICROSCOPIC-ADD ON      Result Value Range   Squamous Epithelial / LPF FEW (*) RARE   WBC, UA TOO NUMEROUS TO COUNT  <3 WBC/hpf   RBC / HPF 3-6  <3 RBC/hpf   Bacteria, UA MANY (*) RARE  POTASSIUM      Result Value Range   Potassium 5.8 (*) 3.5 - 5.1 mEq/L   Dg Hip Complete Left  10/11/2012  *RADIOLOGY REPORT*  Clinical Data: Left hip pain.  LEFT HIP - COMPLETE 2+ VIEW  Comparison: CT 10/05/2012 and 10/11/2012  Findings: Pelvic bony ring is intact.  Degenerative changes in the hips bilaterally.  No evidence for acute fracture or dislocation. The patient has an IVC filter.  Joint space narrowing along the medial and inferior left hip joint.  IMPRESSION: No acute bony abnormality in the pelvis or left hip.  Degenerative changes in both hips.   Original Report Authenticated By: Richarda Overlie, M.D.    Ct Head Wo Contrast  11/06/2012  *RADIOLOGY REPORT*  Clinical Data: Altered mental status.  CT HEAD WITHOUT CONTRAST  Technique:  Contiguous axial images were obtained from the base of the skull through the vertex without contrast.  Comparison: 09/19/2047 CT and 08/18/2012 MR.  Findings: Remote left  occipital infarct.  Remote infarct involving the genu of the left internal capsule and adjacent thalamus.  The encephalomalacia at this level slightly more prominent than on the prior examination but without CT evidence large acute infarct.  No intracranial hemorrhage.  Atrophy without hydrocephalus.  No intracranial mass lesion detected on this unenhanced exam.  Vascular calcifications.  IMPRESSION: Remote infarcts left hemisphere as noted above without CT evidence of large acute infarct.  No intracranial hemorrhage.  Atrophy without hydrocephalus.   Original Report Authenticated By: Lacy Duverney, M.D.    Ct Abdomen Pelvis W Contrast  11/06/2012  *RADIOLOGY REPORT*  Clinical Data: Altered mental status and abdominal pain.  CT ABDOMEN AND PELVIS WITH CONTRAST  Technique:  Multidetector CT imaging of the abdomen and pelvis was performed following the standard protocol during bolus administration of intravenous contrast.  Contrast: 50mL OMNIPAQUE IOHEXOL 300 MG/ML  SOLN  Comparison: 10/11/2012  Findings: Lung bases are clear.  There are coronary artery calcifications.  No evidence for free peritoneal air.  The gallbladder has been removed.  Stable appearance of the liver with mild intrahepatic biliary dilatation.  Common bile duct measures up to 9 mm and not significantly changed.  Mild dilatation of the pancreatic duct without gross abnormality.  Normal appearance of the spleen and both kidneys.  Large amount of plaque along the left side of the proximal abdominal aorta.  There is mural thrombus along the posterior aspect the aorta near the renal artery origins.  No significant aneurysm of the abdominal aorta.  An IVC filter has been placed.  Filter is appropriately positioned below the renal veins.  However, there is thrombus within the IVC filter that appears to involve the infrarenal IVC and extends into the iliac veins bilaterally.  Suspect thrombus within the proximal femoral veins bilaterally.  Uterus has  been removed.  There is fluid in the urinary bladder. There are colonic diverticula without acute inflammatory changes.  There is a low density collection with probable peripheral enhancement involving the left hip adductor musculature.  This collection measures 2.8 x 5.8 cm on the axial images.  The entire extent of the collection is not visualized.  No acute bony abnormality.  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 Thrombosis appears to extend into the proximal femoral veins bilaterally.  There is now a peripherally enhancing fluid collection in the left hip adductor musculature.  Findings are likely related to the previously identified hematoma.  This probably represents an evolving hematoma with a liquefied component.  An infected hematoma cannot be excluded but there is no evidence for gas within the collection.  These results were called by telephone on 11/06/2012 at 4:11 p.m. to Dr. Preston Fleeting, who verbally acknowledged these results.   Original Report Authenticated By: Richarda Overlie, M.D.    Dg Chest Port 1 View  11/06/2012  *RADIOLOGY REPORT*  Clinical Data: Altered mental status.  Hypertension.  PORTABLE CHEST - 1 VIEW  Comparison: CT and plain film chest 10/05/2012.  Findings: Lungs are clear.  Heart size is normal.  No pneumothorax or pleural fluid.  IMPRESSION: No acute disease.   Original Report Authenticated By: Holley Dexter, M.D.       1. Urinary tract infection   2. Prerenal azotemia   3. Hyperkalemia   4. Hypercalcemia       MDM  Report of altered mental status with hypoxia and bradycardia. Old records are reviewed and she has complex history with recent hospitalization for large pulmonary emboli and, history of subdural hematoma, and she had to discontinue anticoagulation because of GI bleeding and spontaneous muscle bleeding. She is currently not on any anticoagulants and family states that she does have and IVC filter. She'll need to be evaluated  for occult infection. With history of subdural hematoma, a CT of the head will need to be obtained. With abdominal pain, CT will be obtained of the abdomen and pelvis. She does not demonstrate any hypoxia, tachypnea, or tachycardia or bradycardia here. I do not see any indication to get a CT of her chest to evaluate her known pulmonary emboli.  Workup is significant for worsening renal function with BUN rising significantly more than creatinine which indicates some degree of dehydration. Marked hyperkalemia was noted however specimen was hemolyzed and ECG shows no evidence of hyperkalemia. Potassium was repeated and is still elevated. She's given a dose of Kayexalate. Hypercalcemia is also noted and may be related to dehydration. Urinary tract infection is identified she's given a dose of ceftriaxone. CT of abdomen demonstrates probable occlusion of  IVC filter with venous flow backing up behind that. She is noted to be taking potassium supplement at the nursing home and a dose of 60 mEq a day. She's not on any other medication which should cause hyperkalemia. Case is discussed with Dr. Karilyn Cota [facility] who agrees to admit the patient.  Dione Booze, MD 11/06/12 [phone]

## 2012-11-06 NOTE — ED Notes (Signed)
CRITICAL VALUE ALERT  Critical value received:  Potassium 6.8  Date of notification:  11/06/12  Time of notification:  1602  Critical value read back:yes  Nurse who received alert:  Tarri Glenn RN  MD notified (1st page):  Dr Preston Fleeting  Time of first page:  1603  MD notified (2nd page):  Time of second page:  Responding MD:  Dr Preston Fleeting   Time MD responded: [phone]

## 2012-11-06 NOTE — ED Notes (Signed)
Pt having difficulty drinking ct contrast. Radiology notified. Family is assisting pt to drink contrast.

## 2012-11-06 NOTE — ED Notes (Signed)
Kayexalate given to pt with great difficulty. Pt sleepy at this time.

## 2012-11-06 NOTE — ED Notes (Signed)
Pt sent from [facility] secondary to c/o altered mental status since last night, Heart rate dropped to 30's with SAO2 low 80's and hypertension per nursing facility.

## 2012-11-07 DIAGNOSIS — R4182 Altered mental status, unspecified: Secondary | ICD-10-CM

## 2012-11-07 DIAGNOSIS — E860 Dehydration: Principal | ICD-10-CM

## 2012-11-07 LAB — GLUCOSE, CAPILLARY
Glucose-Capillary: 69 mg/dL — ABNORMAL LOW (ref 70–99)
Glucose-Capillary: 83 mg/dL (ref 70–99)
Glucose-Capillary: 88 mg/dL (ref 70–99)

## 2012-11-07 LAB — BASIC METABOLIC PANEL
CO2: 20 mEq/L (ref 19–32)
Chloride: 114 mEq/L — ABNORMAL HIGH (ref 96–112)
Potassium: 3.8 mEq/L (ref 3.5–5.1)
Sodium: 147 mEq/L — ABNORMAL HIGH (ref 135–145)

## 2012-11-07 LAB — CBC
HCT: 34.7 % — ABNORMAL LOW (ref 36.0–46.0)
MCV: 91.6 fL (ref 78.0–100.0)
RBC: 3.79 MIL/uL — ABNORMAL LOW (ref 3.87–5.11)
WBC: 9.1 10*3/uL (ref 4.0–10.5)

## 2012-11-07 LAB — MRSA PCR SCREENING: MRSA by PCR: NEGATIVE

## 2012-11-07 MED ORDER — CIPROFLOXACIN HCL 500 MG PO TABS: 500.0000 mg | ORAL_TABLET | Freq: Two times a day (BID) | ORAL | Status: DC

## 2012-11-07 MED ORDER — BOOST / RESOURCE BREEZE PO LIQD
1.0000 | Freq: Three times a day (TID) | ORAL | Status: DC
  Administered 2012-11-07 (×2): 1 via ORAL

## 2012-11-07 MED ORDER — PRO-STAT SUGAR FREE PO LIQD
30.0000 mL | Freq: Three times a day (TID) | ORAL | Status: DC
  Administered 2012-11-07: 30 mL via ORAL
  Filled 2012-11-07: qty 30

## 2012-11-07 NOTE — Progress Notes (Signed)
Utilization Review Complete  

## 2012-11-07 NOTE — Clinical Social Work Note (Signed)
Patient ready for discharge and return to [facility].  Facility notified and agreeable, discharge summary faxed via [facility].  FL2 reviewed w RN.  E Waddell and E Courts notified and agreeable.  Patient will return to facility via [facility].  Discharge packet prepared and placed w shadow chart for transport.   CSW signing off as no further SW needs identified.    Santa Genera, LCSW Clinical Social Worker [phone])

## 2012-11-07 NOTE — Discharge Summary (Signed)
Physician Discharge Summary  [name] AVW:[id number] DOB: 02/05/1924 DOA: 11/06/2012  PCP: Colette Ribas, MD  Admit date: 11/06/2012 Discharge date: 11/07/2012  Time spent: Greater than 30 minutes  Recommendations for Outpatient Follow-up:  1. Followup on urine culture.   Discharge Diagnoses:  1. Altered mental status secondary to dehydration and probable UTI. 2. Multi-infarct state. 3. Thromboembolic disease. Status post IVC filter, with thrombosis now. 4. Diabetes mellitus.   Discharge Condition: Stable.  Diet recommendation: Regular as tolerated.  Filed Weights   11/06/12 1213 11/06/12 2100  Weight: 58.968 kg (130 lb) 51.755 kg (114 lb 1.6 oz)    History of present illness:  This 79 year old lady was admitted to the hospital yesterday with symptoms of altered mental status. Please see initial history as outlined below: HPI: [name] is a 79 y.o. female with a complex medical history significant for hypertension, diabetes, erosive esophagitis, multiple prior strokes and a recent hospitalization 09/2012 where she was diagnosed with a hemodynamically significant bilateral pulmonary embolism we're treatment was limited due to life-threatening acute GI bleeding as well as a large intramuscular hematoma in her left thigh and hip. An IVC filter was placed but she was deemed too unsafe to have any further anticoagulation started or considered in the future. Ongoing goals of care were held with the family on multiple occasions in the understand that patient is critically and terminally ill and likely die from this pulmonary embolism. Following that hospitalization she was discharged back to [facility] nursing home with a focus on palliation. This evening she was brought into the emergency department from [facility] nursing home with altered mental status per the facility staff, but family feels she is at her baseline. She is as expected decline since her last discharge. She has  been only able to take in minimal oral nutrition and hydration, unable to participate in any kind of rehabilitation, and has been struggling with pain related to the hematoma in her hip and thigh as well as left sided abdominal pain.  In the ER she had evidence of dehydration the mildly elevated BUN, hypercalcemia, and clinical evidence of dehydration. Additionally she had an elevated K at 5.8. She was found to be very lethargic. Ua suggests infection present. CT of abdomen and pelvis shows almost complete obstruction and thrombosis of the IVC filter as well as iliac veins and femoral veins bilaterally. No family at bedside during my evaluation to provide guidance with goals of care, over these have been outlined in previous hospitalizations.  Hospital Course:  The patient overnight has improved significantly after receiving IV fluids. She is also had intravenous Rocephin for urinalysis which was suggestive of UTI. I think she is well hydrated now and is stable for discharge back to the skilled nursing facility.. I did speak with the patient's niece, who is well aware of her condition and discussed comfort measures. I have told the niece that there is an option of not bringing her to the hospital anymore and at this point comfort measures would be appropriate. She will consider this again.  Procedures:   none.   Consultations:  None.  Discharge Exam: Filed Vitals:   11/06/12 2057 11/06/12 2100 11/07/12 0500 11/07/12 0934  BP: 154/69 138/81 179/96 171/85  Pulse: 82 77 75 77  Temp: 98.3 F (36.8 C) 98 F (36.7 C) 97.9 F (36.6 C)   TempSrc: Oral Oral Oral   Resp: 18 15 19    Height:      Weight:  51.755 kg (114 lb 1.6 oz)    SpO2: 99% 94% 98%     General: She looks cachectic and chronically sick. Cardiovascular: Heart sounds are present and normal without murmurs. Respiratory: Lung fields are clear. She is alert at the present time. I'm not sure how much she understands my conversation.  There does not appear to be any obvious focal neurological signs  Discharge Instructions  Discharge Orders   Future Orders Complete By Expires     Diet - low sodium heart healthy  As directed     Increase activity slowly  As directed         Medication List    STOP taking these medications       potassium chloride 10 MEQ CR capsule  Commonly known as:  MICRO-K      TAKE these medications       ciprofloxacin 500 MG tablet  Commonly known as:  CIPRO  Take 1 tablet (500 mg total) by mouth 2 (two) times daily.     feeding supplement Liqd  Take 1 Container by mouth 2 (two) times daily. Give 120 ml two times per day for supplement     FLUoxetine 20 MG capsule  Commonly known as:  PROZAC  Take 20 mg by mouth daily.     methylphenidate 5 MG tablet  Commonly known as:  RITALIN  Take 1 tablet by mouth twice a day in the morning and at noon     morphine CONCENTRATE 10 mg / 0.5 ml concentrated solution  Take 0.5 mLs (10 mg total) by mouth every 4 (four) hours as needed for pain.     omeprazole 20 MG tablet  Commonly known as:  PRILOSEC OTC  Take 20 mg by mouth daily.     simvastatin 20 MG tablet  Commonly known as:  ZOCOR  Take 20 mg by mouth daily.       No Known Allergies    The results of significant diagnostics from this hospitalization (including imaging, microbiology, ancillary and laboratory) are listed below for reference.    Significant Diagnostic Studies: Ct Abdomen Pelvis Wo Contrast  10/11/2012  *RADIOLOGY REPORT*  Clinical Data: 79 year old female back pain hip pain on blood thinners.  Possible retroperitoneal hemorrhage.  Anemia.  CT ABDOMEN AND PELVIS WITHOUT CONTRAST  Technique:  Multidetector CT imaging of the abdomen and pelvis was performed following the standard protocol without intravenous contrast.  Comparison: CT abdomen and pelvis 10/05/2012.  Findings: Interval mildly increased right lower lobe posterior basal pulmonary opacity which is confluent,  and there is a small right costophrenic sulcus pleural effusion.  No left pleural effusion or pericardial effusion.  Stable visualized osseous structures.  No retroperitoneal or intra-abdominal hemorrhage.  There is subcutaneous stranding about both flanks and hips greater on the left.  Interval marked enlargement of the left left at Dr. muscles of the left hip (especially abductor brevis).  See series 2 image 92.  The entire abductor muscle group is expanded, now at the 70 cm in thickness (previously 35 mm at comparable level).  The entire extent of muscle involvement is not included. There is some associated stranding/hematoma in the subcutaneous fat of the perineum.  Minor associated presacral stranding.  Otherwise no intrapelvic or retroperitoneal hematoma.  Gas within the rectum.  Gas and fluid within the bladder.  Foley catheter no longer present.  Sigmoid diverticulosis.  Much of the left colon is decompressed with scattered diverticula. Intermittent diverticula in the more proximal colon.  No dilated small bowel.  Gallbladder surgically absent.  Stable liver, spleen, pancreas, adrenal glands, and kidneys.  IVC filter now in place. Extensive calcified atherosclerosis of the aorta, pelvic arteries, and the branches.  IMPRESSION: 1.  Acute intramuscular hemorrhage of the left hip abductor muscle group. 2. Mild surrounding subcutaneous hematoma including at the left perineum.  Minimal if any intrapelvic/presacral hematoma. No retroperitoneal hematoma. 3.  Small layering right pleural effusion is new.  Increased right lung base opacity favor atelectasis. 4.  Interval IVC filter placement. 5.  Foley catheter removed.  Residual gas within the bladder, favor catheter related.   Original Report Authenticated By: Erskine Speed, M.D.    Dg Hip Complete Left  10/11/2012  *RADIOLOGY REPORT*  Clinical Data: Left hip pain.  LEFT HIP - COMPLETE 2+ VIEW  Comparison: CT 10/05/2012 and 10/11/2012  Findings: Pelvic bony ring  is intact.  Degenerative changes in the hips bilaterally.  No evidence for acute fracture or dislocation. The patient has an IVC filter.  Joint space narrowing along the medial and inferior left hip joint.  IMPRESSION: No acute bony abnormality in the pelvis or left hip.  Degenerative changes in both hips.   Original Report Authenticated By: Richarda Overlie, M.D.    Ct Head Wo Contrast  11/06/2012  *RADIOLOGY REPORT*  Clinical Data: Altered mental status.  CT HEAD WITHOUT CONTRAST  Technique:  Contiguous axial images were obtained from the base of the skull through the vertex without contrast.  Comparison: 09/19/2047 CT and 08/18/2012 MR.  Findings: Remote left occipital infarct.  Remote infarct involving the genu of the left internal capsule and adjacent thalamus.  The encephalomalacia at this level slightly more prominent than on the prior examination but without CT evidence large acute infarct.  No intracranial hemorrhage.  Atrophy without hydrocephalus.  No intracranial mass lesion detected on this unenhanced exam.  Vascular calcifications.  IMPRESSION: Remote infarcts left hemisphere as noted above without CT evidence of large acute infarct.  No intracranial hemorrhage.  Atrophy without hydrocephalus.   Original Report Authenticated By: Lacy Duverney, M.D.    Ct Abdomen Pelvis W Contrast  11/06/2012  *RADIOLOGY REPORT*  Clinical Data: Altered mental status and abdominal pain.  CT ABDOMEN AND PELVIS WITH CONTRAST  Technique:  Multidetector CT imaging of the abdomen and pelvis was performed following the standard protocol during bolus administration of intravenous contrast.  Contrast: 50mL OMNIPAQUE IOHEXOL 300 MG/ML  SOLN  Comparison: 10/11/2012  Findings: Lung bases are clear.  There are coronary artery calcifications.  No evidence for free peritoneal air.  The gallbladder has been removed.  Stable appearance of the liver with mild intrahepatic biliary dilatation.  Common bile duct measures up to 9 mm and not  significantly changed.  Mild dilatation of the pancreatic duct without gross abnormality.  Normal appearance of the spleen and both kidneys.  Large amount of plaque along the left side of the proximal abdominal aorta.  There is mural thrombus along the posterior aspect the aorta near the renal artery origins.  No significant aneurysm of the abdominal aorta.  An IVC filter has been placed.  Filter is appropriately positioned below the renal veins.  However, there is thrombus within the IVC filter that appears to involve the infrarenal IVC and extends into the iliac veins bilaterally.  Suspect thrombus within the proximal femoral veins bilaterally.  Uterus has been removed.  There is fluid in the urinary bladder. There are colonic diverticula without acute inflammatory changes.  There is  a low density collection with probable peripheral enhancement involving the left hip adductor musculature.  This collection measures 2.8 x 5.8 cm on the axial images.  The entire extent of the collection is not visualized.  No acute bony abnormality.  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 Thrombosis appears to extend into the proximal femoral veins bilaterally.  There is now a peripherally enhancing fluid collection in the left hip adductor musculature.  Findings are likely related to the previously identified hematoma.  This probably represents an evolving hematoma with a liquefied component.  An infected hematoma cannot be excluded but there is no evidence for gas within the collection.  These results were called by telephone on 11/06/2012 at 4:11 p.m. to Dr. Preston Fleeting, who verbally acknowledged these results.   Original Report Authenticated By: Richarda Overlie, M.D.    Dg Chest Port 1 View  11/06/2012  *RADIOLOGY REPORT*  Clinical Data: Altered mental status.  Hypertension.  PORTABLE CHEST - 1 VIEW  Comparison: CT and plain film chest 10/05/2012.  Findings: Lungs are clear.  Heart size is normal.  No  pneumothorax or pleural fluid.  IMPRESSION: No acute disease.   Original Report Authenticated By: Holley Dexter, M.D.     Microbiology: Recent Results (from the past 240 hour(s))  MRSA PCR SCREENING     Status: None   Collection Time    11/07/12  5:54 AM      Result Value Range Status   MRSA by PCR NEGATIVE  NEGATIVE Final   Comment:            The GeneXpert MRSA Assay (FDA     approved for NASAL specimens     only), is one component of a     comprehensive MRSA colonization     surveillance program. It is not     intended to diagnose MRSA     infection nor to guide or     monitor treatment for     MRSA infections.     Labs: Basic Metabolic Panel:  Recent Labs Lab 11/06/12 1430 11/06/12 1616 11/07/12 0554  NA 145  --  147*  K 6.8* 5.8* 3.8  CL 112  --  114*  CO2 18*  --  20  GLUCOSE 120*  --  96  BUN 34*  --  21  CREATININE 0.72  --  0.65  CALCIUM 11.2*  --  9.3   Liver Function Tests:  Recent Labs Lab 11/06/12 1430  AST 25  ALT 8  ALKPHOS 81  BILITOT 0.7  PROT 9.3*  ALBUMIN 4.1     CBC:  Recent Labs Lab 11/06/12 1430 11/07/12 0554  WBC 10.5 9.1  NEUTROABS 8.3*  --   HGB 14.6 11.0*  HCT 46.7* 34.7*  MCV 91.7 91.6  PLT 187 150     BNP: BNP (last 3 results)  Recent Labs  10/05/12 2027  PROBNP 332.0   CBG:  Recent Labs Lab 11/06/12 2319 11/07/12 0740 11/07/12 0806 11/07/12 1125  GLUCAP 88 69* 83 88       Signed:  GOSRANI,NIMISH C  [facility] 11/07/2012, 11:38 AM

## 2012-11-07 NOTE — Care Management Note (Signed)
CARE MANAGEMENT NOTE 11/07/2012  Patient:  [name], [name]   Account Number:  [id number]  Date Initiated:  11/07/2012  Documentation initiated by:  Rosemary Holms  Subjective/Objective Assessment:   pt admitted from [facility] with AMS, UTI and HyperKalemia. Once medically stable, should return to SNF.     Action/Plan:   Anticipated DC Date:  11/10/2012   Anticipated DC Plan:  SKILLED NURSING FACILITY  In-house referral  Clinical Social Worker      DC Planning Services  CM consult      Choice offered to / List presented to:             Status of service:  In process, will continue to follow Medicare Important Message given?   (If response is "NO", the following Medicare IM given date fields will be blank) Date Medicare IM given:   Date Additional Medicare IM given:    Discharge Disposition:    Per UR Regulation:    If discussed at Long Length of Stay Meetings, dates discussed:    Comments:  11/07/12 Rosemary Holms RN BSN CM

## 2012-11-07 NOTE — Progress Notes (Signed)
Report given to Angela Nevin, LPN.  No further questions or concerns voiced at this time.  Pt was transferred to [facility] via [facility] with her packet.    Prior to discharge I notified Dr. Nobie Putnam of the patients BP and that the residents CBG was in the 60's prior to breakfast.  After breakfast and extra orange juice her glucose was 83.  She has no c/o of pain or other concerns voiced at this time.

## 2012-11-07 NOTE — Progress Notes (Signed)
INITIAL NUTRITION ASSESSMENT  DOCUMENTATION CODES Per approved criteria  -Non-severe (moderate) malnutrition in the context of chronic illness; ongoing   INTERVENTION:  Resource Breeze po TID, each supplement provides 250 kcal and 9 grams of protein.  ProStat 30 ml TID (each 30 ml provides 100 kcal, 15 gr protein)  NUTRITION DIAGNOSIS: Malnutriton related to inadequate oral intake as evidenced by moderate temporal wasting and PMH poor oral intake (dehydration, adult FTT) and wt loss.   Goal: Based on pt/ family decisions regarding agressiveness of care  Monitor:  Care plan progression  Reason for Assessment: Malnutrition Screen  79 y.o. female  Admitting Dx: Altered mental status  ASSESSMENT: Pt assess by RD on October 07, 2012. Hx of poor po intake and moderate malnutrition in the context of chronic illness; ongoing. Pt alone at time of visit. Her nutritional status remains poor due to inadequate oral intake.  Agree with meeting between pt and family to clarify their wishes as to how aggressive to be with her care.  Once goals are identified will provide further nutrition support recommendations if needed. Meanwhile, will add oral nutrition supplement.   Nutrition Focused Physical Exam:  Subcutaneous Fat:  Orbital Region: moderate malnutriton Upper Arm Region: moderate malnutriton Thoracic and Lumbar Region: n/a  Muscle:  Temple Region: moderate  Clavicle Bone Region: moderate Clavicle and Acromion Bone Region: moderate Scapular Bone Region: n/a Dorsal Hand: well-nourished Patellar Region: moderate Anterior Thigh Region: n/a Posterior Calf Region: n/a  Edema: none noted   Height: Ht Readings from Last 1 Encounters:  11/06/12 5\' 4"  (1.626 m)    Weight: Wt Readings from Last 1 Encounters:  11/06/12 114 lb 1.6 oz (51.755 kg)    Ideal Body Weight: 120# (54.5 kg)  % Ideal Body Weight: 95%  Wt Readings from Last 10 Encounters:  11/06/12 114 lb 1.6 oz (51.755  kg)  10/07/12 143 lb 8.3 oz (65.1 kg)  08/19/12 147 lb 11.3 oz (67 kg)  08/28/11 132 lb 14.4 oz (60.283 kg)  08/28/11 132 lb 14.4 oz (60.283 kg)  08/28/11 132 lb 14.4 oz (60.283 kg)  Some discrepancy noted in wt hx, maybe related to mechanical malfunction  Usual Body Weight: 130#  % Usual Body Weight: 88%  BMI:  Body mass index is 19.58 kg/(m^2). Normal range  Estimated Nutritional Needs: Kcal: 1300-1500  Protein: 60-70 gr Fluid: >1500 ml/day  Skin: No issues noted  Diet Order: Dysphagia 3- thin liquids (po 20% breakfast this morning)  EDUCATION NEEDS: -Education not appropriate at this time   Intake/Output Summary (Last 24 hours) at 11/07/12 1018 Last data filed at 11/07/12 0930  Gross per 24 hour  Intake    120 ml  Output      8 ml  Net    112 ml    Last BM: PTA  Labs:   Recent Labs Lab 11/06/12 1430 11/06/12 1616 11/07/12 0554  NA 145  --  147*  K 6.8* 5.8* 3.8  CL 112  --  114*  CO2 18*  --  20  BUN 34*  --  21  CREATININE 0.72  --  0.65  CALCIUM 11.2*  --  9.3  GLUCOSE 120*  --  96    CBG (last 3)   Recent Labs  11/06/12 2319 11/07/12 0740 11/07/12 0806  GLUCAP 88 69* 83    Scheduled Meds: . sodium chloride   Intravenous STAT  . FLUoxetine  20 mg Oral Daily  . insulin aspart  0-5 Units Subcutaneous  QHS  . insulin aspart  0-9 Units Subcutaneous TID WC    Continuous Infusions: . sodium chloride 75 mL/hr at 11/06/12 2324    Past Medical History  Diagnosis Date  . Stroke 2000 and 2002  . Hypertension   . Erosive gastritis 08/29/2011    Per EGD  . Hiatal hernia 08/29/2011    Per EGD  . Diverticulosis 08/30/2011    Per colonoscopy  . Peripheral neuropathy 08/30/2011  . EKG abnormalities February 2013    Lateral inverted T waves  . 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 Subdural hematoma 08/2012    Status post fall  . Dysphagia, pharyngoesophageal phase 08/16/2012  . Type II or unspecified type diabetes mellitus with neurological  manifestations, not stated as uncontrolled(250.60)   . Acute left PCA stroke 08/16/2012    Past Surgical History  Procedure Laterality Date  . Abdominal hysterectomy    . Tonsillectomy    . Cholecystectomy    . Appendectomy    . Esophagogastroduodenoscopy  08/28/2011    Procedure: ESOPHAGOGASTRODUODENOSCOPY (EGD);  Surgeon: Malissa Hippo, MD;  Location: [facility];  Service: Endoscopy;  Laterality: N/A;  . Colonoscopy  08/29/2011    Procedure: COLONOSCOPY;  Surgeon: Malissa Hippo, MD;  Location: [facility];  Service: Endoscopy;  Laterality: N/A;    Royann Shivers MS,RD,LDN,CSG Office: [phone] Pager: [phone]

## 2012-11-07 NOTE — H&P (Signed)
[facility] History and Physical  [name] ZOX:[id number] DOB: 02/05/1924 DOA: 11/06/2012  Referring physician: Etheleen Mayhew PCP: Colette Ribas, MD  Specialists: none  Chief Complaint: AMS, Failure to Thrive  HPI: [name] is a 79 y.o. female with a complex medical history significant for hypertension, diabetes, erosive esophagitis, multiple prior strokes and a recent hospitalization 09/2012 where she was diagnosed with a hemodynamically significant bilateral pulmonary embolism we're treatment was limited due to life-threatening acute GI bleeding as well as a large intramuscular hematoma in her left thigh and hip. An IVC filter was placed but she was deemed too unsafe to have any further anticoagulation started or considered in the future. Ongoing goals of care were held with the family on multiple occasions in the understand that patient is critically and terminally ill and likely die from this pulmonary embolism. Following that hospitalization she was discharged back to [facility] nursing home with a focus on palliation. This evening she was brought into the emergency department from [facility] nursing home with altered mental status per the facility staff, but family feels she is at her baseline. She is as expected decline since her last discharge. She has been only able to take in minimal oral nutrition and hydration, unable to participate in any kind of rehabilitation, and has been struggling with pain related to the hematoma in her hip and thigh as well as left sided abdominal pain.   In the ER she had evidence of dehydration the mildly elevated BUN, hypercalcemia, and clinical evidence of dehydration. Additionally she had an elevated K at 5.8. She was found to be very lethargic. Ua suggests infection present. CT of abdomen and pelvis shows almost complete obstruction and thrombosis of the IVC filter as well as iliac veins and femoral veins bilaterally. No family at bedside  during my evaluation to provide guidance with goals of care, over these have been outlined in previous hospitalizations.  Review of Systems: Unable to obtain from patient  Past Medical History  Diagnosis Date  . Stroke 2000 and 2002  . Hypertension   . Erosive gastritis 08/29/2011    Per EGD  . Hiatal hernia 08/29/2011    Per EGD  . Diverticulosis 08/30/2011    Per colonoscopy  . Peripheral neuropathy 08/30/2011  . EKG abnormalities February 2013    Lateral inverted T waves  . 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 Subdural hematoma 08/2012    Status post fall  . Dysphagia, pharyngoesophageal phase 08/16/2012  . Type II or unspecified type diabetes mellitus with neurological manifestations, not stated as uncontrolled(250.60)   . Acute left PCA stroke 08/16/2012   Past Surgical History  Procedure Laterality Date  . Abdominal hysterectomy    . Tonsillectomy    . Cholecystectomy    . Appendectomy    . Esophagogastroduodenoscopy  08/28/2011    Procedure: ESOPHAGOGASTRODUODENOSCOPY (EGD);  Surgeon: Malissa Hippo, MD;  Location: [facility];  Service: Endoscopy;  Laterality: N/A;  . Colonoscopy  08/29/2011    Procedure: COLONOSCOPY;  Surgeon: Malissa Hippo, MD;  Location: [facility];  Service: Endoscopy;  Laterality: N/A;   Social History:  reports that she has never smoked. She does not have any smokeless tobacco history on file. She reports that she does not drink alcohol or use illicit drugs. Resident at a [facility] skilled nursing facility.   No Known Allergies  No family history on file. unable to obtain from patient  Prior to Admission medications  Medication Sig Start Date End Date Taking? Authorizing Provider  feeding supplement (RESOURCE BREEZE) LIQD Take 1 Container by mouth 2 (two) times daily. Give 120 ml two times per day for supplement   Yes Historical Provider, MD  FLUoxetine (PROZAC) 20 MG capsule Take 20 mg by mouth daily.   Yes Historical Provider, MD   methylphenidate (RITALIN) 5 MG tablet Take 1 tablet by mouth twice a day in the morning and at noon 09/16/12  Yes Kimber Relic, MD  omeprazole (PRILOSEC OTC) 20 MG tablet Take 20 mg by mouth daily.   Yes Historical Provider, MD  potassium chloride (MICRO-K) 10 MEQ CR capsule Take 30 mEq by mouth 2 (two) times daily.    Yes Historical Provider, MD  simvastatin (ZOCOR) 20 MG tablet Take 20 mg by mouth daily.   Yes Historical Provider, MD  Morphine Sulfate (MORPHINE CONCENTRATE) 10 mg / 0.5 ml concentrated solution Take 0.5 mLs (10 mg total) by mouth every 4 (four) hours as needed for pain. 10/14/12   Erick Blinks, MD   Physical Exam: Filed Vitals:   11/06/12 1516 11/06/12 1759 11/06/12 2057 11/06/12 2100  BP: 165/88 162/72 154/69 138/81  Pulse: 87 79 82 77  Temp:   98.3 F (36.8 C) 98 F (36.7 C)  TempSrc:   Oral Oral  Resp: 20 16 18 15   Height:      Weight:    51.755 kg (114 lb 1.6 oz)  SpO2: 97% 100% 99% 94%     General:  Thin, obtunded, unable to answer any of my questions or wake up for my exam  Eyes: Pupils are normal size but very sluggish  ENT: Dry mucous membranes  Neck: No JVD  Cardiovascular: Regular no murmurs rubs or gallops  Respiratory: Shallow inspiratory effort, no wheezing or crackles auscultated  Abdomen: Significant diffuse tenderness to palpation, patient grimaces with abdominal exam, hypoactive bowel sounds  Skin: No rashes or lesions  Musculoskeletal: Muscle atrophy, left thigh is tender, bilateral lower extremity edema  Psychiatric: Unable to assess due to patient's mental status  Neurologic: Global encephalopathy.  Labs on Admission:  Basic Metabolic Panel:  Recent Labs Lab 11/06/12 1430 11/06/12 1616  NA 145  --   K 6.8* 5.8*  CL 112  --   CO2 18*  --   GLUCOSE 120*  --   BUN 34*  --   CREATININE 0.72  --   CALCIUM 11.2*  --    Liver Function Tests:  Recent Labs Lab 11/06/12 1430  AST 25  ALT 8  ALKPHOS 81  BILITOT 0.7   PROT 9.3*  ALBUMIN 4.1   No results found for this basename: LIPASE, AMYLASE,  in the last 168 hours No results found for this basename: AMMONIA,  in the last 168 hours CBC:  Recent Labs Lab 11/06/12 1430  WBC 10.5  NEUTROABS 8.3*  HGB 14.6  HCT 46.7*  MCV 91.7  PLT 187   Cardiac Enzymes: No results found for this basename: CKTOTAL, CKMB, CKMBINDEX, TROPONINI,  in the last 168 hours  BNP (last 3 results)  Recent Labs  10/05/12 2027  PROBNP 332.0   CBG:  Recent Labs Lab 11/06/12 2319  GLUCAP 88    Radiological Exams on Admission: Ct Head Wo Contrast  11/06/2012  *RADIOLOGY REPORT*  Clinical Data: Altered mental status.  CT HEAD WITHOUT CONTRAST  Technique:  Contiguous axial images were obtained from the base of the skull through the vertex without contrast.  Comparison: 09/19/2047  CT and 08/18/2012 MR.  Findings: Remote left occipital infarct.  Remote infarct involving the genu of the left internal capsule and adjacent thalamus.  The encephalomalacia at this level slightly more prominent than on the prior examination but without CT evidence large acute infarct.  No intracranial hemorrhage.  Atrophy without hydrocephalus.  No intracranial mass lesion detected on this unenhanced exam.  Vascular calcifications.  IMPRESSION: Remote infarcts left hemisphere as noted above without CT evidence of large acute infarct.  No intracranial hemorrhage.  Atrophy without hydrocephalus.   Original Report Authenticated By: Lacy Duverney, M.D.    Ct Abdomen Pelvis W Contrast  11/06/2012  *RADIOLOGY REPORT*  Clinical Data: Altered mental status and abdominal pain.  CT ABDOMEN AND PELVIS WITH CONTRAST  Technique:  Multidetector CT imaging of the abdomen and pelvis was performed following the standard protocol during bolus administration of intravenous contrast.  Contrast: 50mL OMNIPAQUE IOHEXOL 300 MG/ML  SOLN  Comparison: 10/11/2012  Findings: Lung bases are clear.  There are coronary artery  calcifications.  No evidence for free peritoneal air.  The gallbladder has been removed.  Stable appearance of the liver with mild intrahepatic biliary dilatation.  Common bile duct measures up to 9 mm and not significantly changed.  Mild dilatation of the pancreatic duct without gross abnormality.  Normal appearance of the spleen and both kidneys.  Large amount of plaque along the left side of the proximal abdominal aorta.  There is mural thrombus along the posterior aspect the aorta near the renal artery origins.  No significant aneurysm of the abdominal aorta.  An IVC filter has been placed.  Filter is appropriately positioned below the renal veins.  However, there is thrombus within the IVC filter that appears to involve the infrarenal IVC and extends into the iliac veins bilaterally.  Suspect thrombus within the proximal femoral veins bilaterally.  Uterus has been removed.  There is fluid in the urinary bladder. There are colonic diverticula without acute inflammatory changes.  There is a low density collection with probable peripheral enhancement involving the left hip adductor musculature.  This collection measures 2.8 x 5.8 cm on the axial images.  The entire extent of the collection is not visualized.  No acute bony abnormality.  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 Thrombosis appears to extend into the proximal femoral veins bilaterally.  There is now a peripherally enhancing fluid collection in the left hip adductor musculature.  Findings are likely related to the previously identified hematoma.  This probably represents an evolving hematoma with a liquefied component.  An infected hematoma cannot be excluded but there is no evidence for gas within the collection.  These results were called by telephone on 11/06/2012 at 4:11 p.m. to Dr. Preston Fleeting, who verbally acknowledged these results.   Original Report Authenticated By: Richarda Overlie, M.D.    Dg Chest Port 1  View  11/06/2012  *RADIOLOGY REPORT*  Clinical Data: Altered mental status.  Hypertension.  PORTABLE CHEST - 1 VIEW  Comparison: CT and plain film chest 10/05/2012.  Findings: Lungs are clear.  Heart size is normal.  No pneumothorax or pleural fluid.  IMPRESSION: No acute disease.   Original Report Authenticated By: Holley Dexter, M.D.     EKG: Independently reviewed. Normal sinus rhythm.  Assessment/Plan Principal Problem:   Altered mental status Active Problems:   Anemia   Type II or unspecified type diabetes mellitus with neurological manifestations, not stated as uncontrolled(250.60)   Physical deconditioning  CVA (cerebral infarction)   Dysphagia, pharyngoesophageal phase   Acute pulmonary embolism   Adult failure to thrive   DVT, lower extremity   Hemorrhage of muscle   IVC thrombosis   Abdominal pain   Dehydration   Hypercalcemia   Ms. Brodnax has returned to the hospital from her skilled nursing facility with evidence of progressive decline in deterioration from multiple end-stage medical problems. Her most serious issue is the presence of a pulmonary embolism that  is not being nor will be anticoagulated. The CT obtained of her abdomen and pelvis shows a nearly fully occluded IVC filter, which she is currently not hypotensive does not show evidence of hemodynamic compromise at this point. A decision was made at her last discharge to manage her very conservatively with a focus on comfort and palliation as well as treatment for any reversible medical problems.   1. Altered mental status, multifactorial. May represent terminal decline versus hypoactive delirium related to her urinary tract infection and chronic critical illness state.   Monitor her neuro status  No aggressive or invasive treatment or workup at this time  2. Urinary tract infection  Treat with Rocephin until sensitivities return.  Foley catheter may be placed for comfort and for signs of retention.  3.  Pulmonary embolism, IVC thrombosis, not a candidate for anticoagulation.  Continue to support her with supplemental oxygen, Roxanol for pain and dyspnea.  No further interventions are available or indicated at this time.  4. Hypercalcemia, may be contributing to her altered mental status, appearse to be quite acute, probably related to dehydration and failure to thrive, no known malignancy  Gentle IV hydration  5. Hyperkalemia, probably iatrogenic and clinically insignificant. EKG does not show peaked T waves. Serum creatinine is elevated from her baseline but within a normal range. She is on fairly large doses of potassium supplementation as an outpatient at her facility   Discontinued her oral potassium.   6. Diabetes, stable especially given her by mouth intake.  Sliding scale insulin only, monitoring CBGs.  Code Status: Patient is DO NOT RESUSCITATE from prior hospitalizations, this is a medically appropriate designation. Family Communication: No family at bedside Disposition Plan: This patient is likely entering into the terminal phase of her chronic critical illness- her body is clearly beginning to shut down, as evidenced by her mental status, her nutritional status, and progressive decline. At this point she is probably appropriate for residential hospice family is agreeable and willing versus discharge back to [facility] with hospice and a shift towards full comfort care without rehospitalization.   Time spent: 70 minutes  [facility] Pager [phone]  If 7PM-7AM, please contact night-coverage [email or web address] Password [email or web address] 11/07/2012, 2:55 AM

## 2012-11-07 NOTE — Clinical Social Work Psychosocial (Signed)
    Clinical Social Work Department BRIEF PSYCHOSOCIAL ASSESSMENT 11/07/2012  Patient:  [name], [name]     Account Number:  [id number]     Admit date:  11/06/2012  Clinical Social Worker:  Santa Genera, CLINICAL SOCIAL WORKER  Date/Time:  11/07/2012 12:00 N  Referred by:  Physician  Date Referred:  11/07/2012 Referred for  SNF Placement   Other Referral:   Interview type:  Family Other interview type:   Spoke w facility and family.    PSYCHOSOCIAL DATA Living Status:  FACILITY Admitted from facility:  [facility] Level of care:  Skilled Nursing Facility Primary support name:  E Waddell Primary support relationship to patient:  [place] Degree of support available:   Adequate to meet needs while at SNF    CURRENT CONCERNS Current Concerns  Post-Acute Placement   Other Concerns:    SOCIAL WORK ASSESSMENT / PLAN CSW spoke w family and facility, patient not able to participate fully in assessment. Patient is current resident of [facility], having been placed there at last hospitalization at [facility].  Prior to that, patient had lived alone but had extended period of declining health resulting in need for higher level of care.   Per facility, patient can return and facility will handle [facility] authorization for return to facility.  Did ask for new FL2 which CSW prepared.  Spoke w family, sister and niece are aware and agreeable to patient return to [facility].  No concerns noted from either family or facility.  CSW will proceed w discharge process needed for patient to return to facility.   Assessment/plan status:  No Further Intervention Required Other assessment/ plan:   Information/referral to community resources:   None needed    PATIENT'S/FAMILY'S RESPONSE TO PLAN OF CARE: Family informed of discharge and agreeable.        Santa Genera, LCSW Clinical Social Worker [phone])

## 2012-11-07 NOTE — Plan of Care (Signed)
Problem: Phase I Progression Outcomes Goal: Tolerating diet Outcome: Progressing Encouraging intake and out put

## 2012-11-20 LAB — URINE CULTURE

## 2013-12-27 ENCOUNTER — Encounter (HOSPITAL_COMMUNITY): Payer: Self-pay | Admitting: Emergency Medicine

## 2013-12-27 ENCOUNTER — Emergency Department (HOSPITAL_COMMUNITY): Payer: PRIVATE HEALTH INSURANCE

## 2013-12-27 ENCOUNTER — Emergency Department (HOSPITAL_COMMUNITY)
Admission: EM | Admit: 2013-12-27 | Discharge: 2013-12-27 | Disposition: A | Discharge status: Skilled Nursing Facility | Payer: PRIVATE HEALTH INSURANCE | Attending: Emergency Medicine | Admitting: Emergency Medicine

## 2013-12-27 DIAGNOSIS — Z8719 Personal history of other diseases of the digestive system: Secondary | ICD-10-CM | POA: Diagnosis not present

## 2013-12-27 DIAGNOSIS — G609 Hereditary and idiopathic neuropathy, unspecified: Secondary | ICD-10-CM | POA: Diagnosis not present

## 2013-12-27 DIAGNOSIS — E1149 Type 2 diabetes mellitus with other diabetic neurological complication: Secondary | ICD-10-CM | POA: Diagnosis not present

## 2013-12-27 DIAGNOSIS — S0181XA Laceration without foreign body of other part of head, initial encounter: Secondary | ICD-10-CM

## 2013-12-27 DIAGNOSIS — S0180XA Unspecified open wound of other part of head, initial encounter: Secondary | ICD-10-CM | POA: Diagnosis present

## 2013-12-27 DIAGNOSIS — R011 Cardiac murmur, unspecified: Secondary | ICD-10-CM | POA: Insufficient documentation

## 2013-12-27 DIAGNOSIS — Y929 Unspecified place or not applicable: Secondary | ICD-10-CM | POA: Insufficient documentation

## 2013-12-27 DIAGNOSIS — I10 Essential (primary) hypertension: Secondary | ICD-10-CM | POA: Diagnosis not present

## 2013-12-27 DIAGNOSIS — W1809XA Striking against other object with subsequent fall, initial encounter: Secondary | ICD-10-CM | POA: Diagnosis not present

## 2013-12-27 DIAGNOSIS — W19XXXA Unspecified fall, initial encounter: Secondary | ICD-10-CM

## 2013-12-27 DIAGNOSIS — Y9389 Activity, other specified: Secondary | ICD-10-CM | POA: Diagnosis not present

## 2013-12-27 DIAGNOSIS — Z8673 Personal history of transient ischemic attack (TIA), and cerebral infarction without residual deficits: Secondary | ICD-10-CM | POA: Diagnosis not present

## 2013-12-27 DIAGNOSIS — W050XXA Fall from non-moving wheelchair, initial encounter: Secondary | ICD-10-CM | POA: Diagnosis not present

## 2013-12-27 DIAGNOSIS — F0390 Unspecified dementia without behavioral disturbance: Secondary | ICD-10-CM | POA: Diagnosis not present

## 2013-12-27 DIAGNOSIS — Z79899 Other long term (current) drug therapy: Secondary | ICD-10-CM | POA: Diagnosis not present

## 2013-12-27 NOTE — ED Notes (Signed)
Patient was getting out of wheelchair in her room and fell, hitting her head, R knee and R elbow.  Laceration on R eyebrow, skin tear on R post patellar area.  No obvious deformity of elbow or knee.

## 2013-12-27 NOTE — ED Notes (Signed)
Spoke w/Dr. Jeraldine Loots re: question by [facility] about xray of R elbow.  He states, on assessment, she had free ROM and no pain noted w/out obvious deformity.  Did not feel it was necessary.  Maricela Bo, LPN at [facility] to ice patient's arm overnight and if continued problem tomorrow to send her over for film.

## 2013-12-27 NOTE — ED Notes (Signed)
Report called to Rinaldo Cloud, LPN at [facility].  Pt. Voices no complaints at present.  Notified EMS of need for return transport.

## 2013-12-27 NOTE — ED Provider Notes (Addendum)
CSN: [id number]     Arrival date & time 12/27/13  1828 History   First MD Initiated Contact with Patient 12/27/13 1849     Chief Complaint  Patient presents with  . Fall  . Head Laceration     HPI  Patient presents after a fall at her nursing facility. Patient has dementia, level V caveat. Per nursing home report, patient attempted to walk, which she does not typically do, do to chronic weakness, instability, bedbound status. After attempting to walk, patient fell striking her head.  On since that time she's complaining of pain in the face No report of loss of consciousness, change in behavior. Seemingly, prior to that the patient was in her usual state of health.   Past Medical History  Diagnosis Date  . Stroke 2000 and 2002  . Hypertension   . Erosive gastritis 08/29/2011    Per EGD  . Hiatal hernia 08/29/2011    Per EGD  . Diverticulosis 08/30/2011    Per colonoscopy  . Peripheral neuropathy 08/30/2011  . EKG abnormalities February 2013    Lateral inverted T waves  . 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 Subdural hematoma 08/2012    Status post fall  . Dysphagia, pharyngoesophageal phase 08/16/2012  . Type II or unspecified type diabetes mellitus with neurological manifestations, not stated as uncontrolled   . Acute left PCA stroke 08/16/2012   Past Surgical History  Procedure Laterality Date  . Abdominal hysterectomy    . Tonsillectomy    . Cholecystectomy    . Appendectomy    . Esophagogastroduodenoscopy  08/28/2011    Procedure: ESOPHAGOGASTRODUODENOSCOPY (EGD);  Surgeon: Malissa HippoNajeeb U Rehman, MD;  [facility];  Service: Endoscopy;  Laterality: N/A;  . Colonoscopy  08/29/2011    Procedure: COLONOSCOPY;  Surgeon: Malissa HippoNajeeb U Rehman, MD;  [facility];  Service: Endoscopy;  Laterality: N/A;   History reviewed. No pertinent family history. History  Substance Use Topics  . Smoking status: Never Smoker   . Smokeless tobacco: Not on file  . Alcohol Use: No    OB History   Grav Para Term Preterm Abortions TAB SAB Ect Mult Living                 Review of Systems  Unable to perform ROS: Dementia      Allergies  Review of patient's allergies indicates no known allergies.  Home Medications   Prior to Admission medications   Medication Sig Start Date End Date Taking? Authorizing Provider  dronabinol (MARINOL) 2.5 MG capsule Take 2.5 mg by mouth 2 (two) times daily before a meal.   Yes Historical Provider, MD  FLUoxetine (PROZAC) 10 MG capsule Take 10 mg by mouth daily.   Yes Historical Provider, MD  furosemide (LASIX) 20 MG tablet Take 20 mg by mouth daily.   Yes Historical Provider, MD  methylphenidate (RITALIN) 5 MG tablet Take 5 mg by mouth every morning.   Yes Historical Provider, MD  mirtazapine (REMERON) 7.5 MG tablet Take 7.5 mg by mouth at bedtime.   Yes Historical Provider, MD  omeprazole (PRILOSEC OTC) 20 MG tablet Take 20 mg by mouth every morning.    Yes Historical Provider, MD  potassium chloride SA (K-DUR,KLOR-CON) 20 MEQ tablet Take 20 mEq by mouth daily.   Yes Historical Provider, MD  simvastatin (ZOCOR) 20 MG tablet Take 20 mg by mouth daily.   Yes Historical Provider, MD   BP 160/105  Pulse 82  Temp(Src) 98.3 F (36.8  C) (Oral)  Resp 16  SpO2 99% Physical Exam  Nursing note and vitals reviewed. Constitutional: She has a sickly appearance.  HENT:  Head:    Eyes: Conjunctivae are normal. Right eye exhibits no discharge. Left eye exhibits no discharge.  Neck: Neck supple. No tracheal deviation present. No thyromegaly present.  No pain with range of motion or with palpation of the spine  Cardiovascular: Normal rate and intact distal pulses.   Murmur heard. Pulmonary/Chest: Effort normal. No stridor. No respiratory distress.  Musculoskeletal:  Diffuse atrophy. No gross deformity. Patient moves all extremities spontaneously, though she complains of pain in the right hip with palpation.   Neurological: She is  alert. She is disoriented. She displays atrophy. She displays no tremor. No cranial nerve deficit or sensory deficit. She exhibits abnormal muscle tone.  Psychiatric: Her speech is delayed. She is slowed and withdrawn. Cognition and memory are impaired.    ED Course  Procedures (including critical care time)  Imaging Review Ct Head Wo Contrast  12/27/2013   CLINICAL DATA:  Patient status post fall. Hit right side of the forehead  EXAM: CT HEAD WITHOUT CONTRAST  CT MAXILLOFACIAL WITHOUT CONTRAST  TECHNIQUE: Multidetector CT imaging of the head and maxillofacial structures were performed using the standard protocol without intravenous contrast. Multiplanar CT image reconstructions of the maxillofacial structures were also generated.  COMPARISON:  Head CT 11/06/2012  FINDINGS: CT HEAD FINDINGS  Ventricles and sulci are prominent for age compatible with atrophy. Periventricular and subcortical white matter hypodensity compatible with chronic small vessel ischemic change. Ex vacuo dilatation of the lateral left ventricle secondary to overlying cortical atrophy within the left occipital lobe likely secondary to remote infarct. Large left basal ganglia lacunar infarct. No evidence for acute cortically based infarct, intracranial hemorrhage, mass effect or mass lesion. Extensive atherosclerotic change of the basilar artery. Paranasal sinuses are unremarkable. Mastoid air cells are well aerated  CT MAXILLOFACIAL FINDINGS  The maxilla and mandible are intact. The zygomatic arches are unremarkable. The orbits are unremarkable. No evidence for acute maxillofacial fracture.  IMPRESSION: 1. No evidence for acute maxillofacial fracture. 2. No acute intracranial process. 3. Remote lacune or infarcts.   Electronically Signed   By: Annia Belt M.D.   On: 12/27/2013 20:41   Ct Maxillofacial Wo Cm  12/27/2013   CLINICAL DATA:  Patient status post fall. Hit right side of the forehead  EXAM: CT HEAD WITHOUT CONTRAST  CT  MAXILLOFACIAL WITHOUT CONTRAST  TECHNIQUE: Multidetector CT imaging of the head and maxillofacial structures were performed using the standard protocol without intravenous contrast. Multiplanar CT image reconstructions of the maxillofacial structures were also generated.  COMPARISON:  Head CT 11/06/2012  FINDINGS: CT HEAD FINDINGS  Ventricles and sulci are prominent for age compatible with atrophy. Periventricular and subcortical white matter hypodensity compatible with chronic small vessel ischemic change. Ex vacuo dilatation of the lateral left ventricle secondary to overlying cortical atrophy within the left occipital lobe likely secondary to remote infarct. Large left basal ganglia lacunar infarct. No evidence for acute cortically based infarct, intracranial hemorrhage, mass effect or mass lesion. Extensive atherosclerotic change of the basilar artery. Paranasal sinuses are unremarkable. Mastoid air cells are well aerated  CT MAXILLOFACIAL FINDINGS  The maxilla and mandible are intact. The zygomatic arches are unremarkable. The orbits are unremarkable. No evidence for acute maxillofacial fracture.  IMPRESSION: 1. No evidence for acute maxillofacial fracture. 2. No acute intracranial process. 3. Remote lacune or infarcts.   Electronically Signed  By: Annia Belt M.D.   On: 12/27/2013 20:41    During the initial evaluation discussed the patient's case with her nursing home staff  LACERATION REPAIR Performed by: Gerhard Munch Authorized by: Gerhard Munch Consent: Verbal consent obtained. Risks and benefits: risks, benefits and alternatives were discussed Consent given by: patient Patient identity confirmed: provided demographic data Prepped and Draped in normal sterile fashion Wound explored  Laceration Location: R brow  Laceration Length: 4cm  No Foreign Bodies seen or palpated  Irrigation method: syringe Amount of cleaning: standard  Skin closure: tissue adhesive    Technique:  close, though the edges are somewhat macerated.  Patient tolerance: Patient tolerated the procedure well with no immediate complications.  9:31 PM On re-exam the patient continues to deny any pain in the extremities. She is moving the R arm freely, with no deformity of the elbow, nor pain with palpation.  (NH concern was for possible R elbow injury.)   MDM   Final diagnoses:  Fall, initial encounter  Laceration of face, initial encounter    This elderly female, nonambulatory, with dementia presents after an attempt at ambulation.  Patient fell, striking her head.  No evidence for distress, no radiographic No evidence of intracranial hemorrhage, nor fracture. Patient had wound repair without complication, was discharged back to her nursing facility.    Gerhard Munch, MD 12/27/13 2110  Gerhard Munch, MD 12/27/13 2132

## 2013-12-27 NOTE — ED Notes (Signed)
Cleaned brow laceration and knee skin tear.

## 2014-01-10 ENCOUNTER — Encounter (HOSPITAL_COMMUNITY): Payer: Self-pay | Admitting: Emergency Medicine

## 2014-01-10 ENCOUNTER — Inpatient Hospital Stay (HOSPITAL_COMMUNITY)
Admission: EM | Admit: 2014-01-10 | Discharge: 2014-01-13 | DRG: 280 | Disposition: A | Discharge status: Skilled Nursing Facility | Payer: PRIVATE HEALTH INSURANCE | Attending: Internal Medicine | Admitting: Internal Medicine

## 2014-01-10 ENCOUNTER — Inpatient Hospital Stay (HOSPITAL_COMMUNITY): Payer: PRIVATE HEALTH INSURANCE

## 2014-01-10 ENCOUNTER — Emergency Department (HOSPITAL_COMMUNITY): Payer: PRIVATE HEALTH INSURANCE

## 2014-01-10 DIAGNOSIS — E1149 Type 2 diabetes mellitus with other diabetic neurological complication: Secondary | ICD-10-CM | POA: Diagnosis present

## 2014-01-10 DIAGNOSIS — E46 Unspecified protein-calorie malnutrition: Secondary | ICD-10-CM | POA: Diagnosis present

## 2014-01-10 DIAGNOSIS — R1310 Dysphagia, unspecified: Secondary | ICD-10-CM | POA: Diagnosis present

## 2014-01-10 DIAGNOSIS — N390 Urinary tract infection, site not specified: Secondary | ICD-10-CM

## 2014-01-10 DIAGNOSIS — E782 Mixed hyperlipidemia: Secondary | ICD-10-CM | POA: Diagnosis present

## 2014-01-10 DIAGNOSIS — I442 Atrioventricular block, complete: Secondary | ICD-10-CM | POA: Diagnosis present

## 2014-01-10 DIAGNOSIS — Z8673 Personal history of transient ischemic attack (TIA), and cerebral infarction without residual deficits: Secondary | ICD-10-CM

## 2014-01-10 DIAGNOSIS — I672 Cerebral atherosclerosis: Secondary | ICD-10-CM | POA: Diagnosis present

## 2014-01-10 DIAGNOSIS — R404 Transient alteration of awareness: Secondary | ICD-10-CM | POA: Diagnosis present

## 2014-01-10 DIAGNOSIS — F0390 Unspecified dementia without behavioral disturbance: Secondary | ICD-10-CM | POA: Diagnosis present

## 2014-01-10 DIAGNOSIS — I2119 ST elevation (STEMI) myocardial infarction involving other coronary artery of inferior wall: Secondary | ICD-10-CM | POA: Diagnosis present

## 2014-01-10 DIAGNOSIS — I498 Other specified cardiac arrhythmias: Secondary | ICD-10-CM | POA: Diagnosis present

## 2014-01-10 DIAGNOSIS — Z86711 Personal history of pulmonary embolism: Secondary | ICD-10-CM | POA: Diagnosis not present

## 2014-01-10 DIAGNOSIS — R001 Bradycardia, unspecified: Secondary | ICD-10-CM | POA: Diagnosis present

## 2014-01-10 DIAGNOSIS — E1169 Type 2 diabetes mellitus with other specified complication: Secondary | ICD-10-CM | POA: Diagnosis not present

## 2014-01-10 DIAGNOSIS — I441 Atrioventricular block, second degree: Secondary | ICD-10-CM | POA: Diagnosis present

## 2014-01-10 DIAGNOSIS — I10 Essential (primary) hypertension: Secondary | ICD-10-CM | POA: Diagnosis present

## 2014-01-10 DIAGNOSIS — D649 Anemia, unspecified: Secondary | ICD-10-CM | POA: Diagnosis present

## 2014-01-10 DIAGNOSIS — Z79899 Other long term (current) drug therapy: Secondary | ICD-10-CM | POA: Diagnosis not present

## 2014-01-10 DIAGNOSIS — E1142 Type 2 diabetes mellitus with diabetic polyneuropathy: Secondary | ICD-10-CM | POA: Diagnosis present

## 2014-01-10 DIAGNOSIS — F0150 Vascular dementia without behavioral disturbance: Secondary | ICD-10-CM | POA: Diagnosis present

## 2014-01-10 DIAGNOSIS — Z515 Encounter for palliative care: Secondary | ICD-10-CM

## 2014-01-10 DIAGNOSIS — R627 Adult failure to thrive: Secondary | ICD-10-CM | POA: Diagnosis present

## 2014-01-10 DIAGNOSIS — Z681 Body mass index (BMI) 19 or less, adult: Secondary | ICD-10-CM | POA: Diagnosis not present

## 2014-01-10 DIAGNOSIS — R5381 Other malaise: Secondary | ICD-10-CM

## 2014-01-10 DIAGNOSIS — I213 ST elevation (STEMI) myocardial infarction of unspecified site: Secondary | ICD-10-CM | POA: Diagnosis present

## 2014-01-10 DIAGNOSIS — R570 Cardiogenic shock: Secondary | ICD-10-CM | POA: Diagnosis present

## 2014-01-10 DIAGNOSIS — Z66 Do not resuscitate: Secondary | ICD-10-CM | POA: Diagnosis present

## 2014-01-10 DIAGNOSIS — I219 Acute myocardial infarction, unspecified: Secondary | ICD-10-CM

## 2014-01-10 DIAGNOSIS — R55 Syncope and collapse: Secondary | ICD-10-CM | POA: Diagnosis present

## 2014-01-10 DIAGNOSIS — T68XXXA Hypothermia, initial encounter: Secondary | ICD-10-CM

## 2014-01-10 HISTORY — DX: Heart failure, unspecified: I50.9

## 2014-01-10 HISTORY — DX: Muscle weakness (generalized): M62.81

## 2014-01-10 HISTORY — DX: Other cerebrovascular disease: I67.89

## 2014-01-10 HISTORY — DX: Mixed hyperlipidemia: E78.2

## 2014-01-10 HISTORY — DX: Homonymous bilateral field defects, unspecified side: H53.469

## 2014-01-10 HISTORY — DX: Dysphagia, oral phase: R13.11

## 2014-01-10 HISTORY — DX: Vascular dementia, unspecified severity, without behavioral disturbance, psychotic disturbance, mood disturbance, and anxiety: F01.50

## 2014-01-10 HISTORY — DX: Altered mental status, unspecified: R41.82

## 2014-01-10 HISTORY — DX: Embolism and thrombosis of unspecified artery: I74.9

## 2014-01-10 HISTORY — DX: Other specified depressive episodes: F32.89

## 2014-01-10 HISTORY — DX: Urinary tract infection, site not specified: N39.0

## 2014-01-10 HISTORY — DX: Anemia, unspecified: D64.9

## 2014-01-10 HISTORY — DX: Major depressive disorder, single episode, unspecified: F32.9

## 2014-01-10 HISTORY — DX: Adult failure to thrive: R62.7

## 2014-01-10 LAB — BASIC METABOLIC PANEL
ANION GAP: 16 — AB (ref 5–15)
BUN: 9 mg/dL (ref 6–23)
CHLORIDE: 104 meq/L (ref 96–112)
CO2: 20 meq/L (ref 19–32)
Calcium: 8.9 mg/dL (ref 8.4–10.5)
Creatinine, Ser: 0.69 mg/dL (ref 0.50–1.10)
GFR calc Af Amer: 87 mL/min — ABNORMAL LOW (ref >90)
GFR calc non Af Amer: 75 mL/min — ABNORMAL LOW (ref >90)
Glucose, Bld: 155 mg/dL — ABNORMAL HIGH (ref 70–99)
POTASSIUM: 3.9 meq/L (ref 3.7–5.3)
SODIUM: 140 meq/L (ref 137–147)

## 2014-01-10 LAB — TROPONIN I
Troponin I: <0.3 ng/mL (ref <0.30)
Troponin I: 2.21 ng/mL (ref <0.30)
Troponin I: 9.27 ng/mL (ref <0.30)

## 2014-01-10 LAB — CBC WITH DIFFERENTIAL/PLATELET
BASOS ABS: 0 10*3/uL (ref 0.0–0.1)
Basophils Relative: 0 % (ref 0–1)
Eosinophils Absolute: 0.1 10*3/uL (ref 0.0–0.7)
Eosinophils Relative: 2 % (ref 0–5)
HEMATOCRIT: 33.3 % — AB (ref 36.0–46.0)
HEMOGLOBIN: 10.5 g/dL — AB (ref 12.0–15.0)
LYMPHS PCT: 38 % (ref 12–46)
Lymphs Abs: 2.2 10*3/uL (ref 0.7–4.0)
MCH: 28.9 pg (ref 26.0–34.0)
MCHC: 31.5 g/dL (ref 30.0–36.0)
MCV: 91.7 fL (ref 78.0–100.0)
MONO ABS: 0.4 10*3/uL (ref 0.1–1.0)
MONOS PCT: 6 % (ref 3–12)
NEUTROS ABS: 3.2 10*3/uL (ref 1.7–7.7)
Neutrophils Relative %: 54 % (ref 43–77)
Platelets: 240 10*3/uL (ref 150–400)
RBC: 3.63 MIL/uL — AB (ref 3.87–5.11)
RDW: 14 % (ref 11.5–15.5)
WBC: 5.9 10*3/uL (ref 4.0–10.5)

## 2014-01-10 LAB — URINALYSIS, ROUTINE W REFLEX MICROSCOPIC
Bilirubin Urine: NEGATIVE
Glucose, UA: NEGATIVE mg/dL
HGB URINE DIPSTICK: NEGATIVE
Ketones, ur: NEGATIVE mg/dL
Nitrite: NEGATIVE
Protein, ur: 30 mg/dL — AB
SPECIFIC GRAVITY, URINE: 1.01 (ref 1.005–1.030)
UROBILINOGEN UA: 0.2 mg/dL (ref 0.0–1.0)
pH: 7.5 (ref 5.0–8.0)

## 2014-01-10 LAB — GLUCOSE, CAPILLARY
Glucose-Capillary: 141 mg/dL — ABNORMAL HIGH (ref 70–99)
Glucose-Capillary: 131 mg/dL — ABNORMAL HIGH (ref 70–99)

## 2014-01-10 LAB — PRO B NATRIURETIC PEPTIDE: Pro B Natriuretic peptide (BNP): 1016 pg/mL — ABNORMAL HIGH (ref 0–450)

## 2014-01-10 LAB — URINE MICROSCOPIC-ADD ON

## 2014-01-10 LAB — MRSA PCR SCREENING: MRSA by PCR: POSITIVE — AB

## 2014-01-10 LAB — TSH: TSH: 4.23 u[IU]/mL (ref 0.350–4.500)

## 2014-01-10 MED ORDER — MORPHINE SULFATE 4 MG/ML IJ SOLN
INTRAMUSCULAR | Status: AC
Start: 2014-01-10 — End: 2014-01-10
  Administered 2014-01-10: 2 mg
  Filled 2014-01-10: qty 1

## 2014-01-10 MED ORDER — SODIUM CHLORIDE 0.9 % IV SOLN: 250.0000 mL | INTRAVENOUS | Status: DC | PRN

## 2014-01-10 MED ORDER — ASPIRIN 81 MG PO CHEW
81.0000 mg | CHEWABLE_TABLET | Freq: Every day | ORAL | Status: DC
  Administered 2014-01-10 – 2014-01-13 (×3): 81 mg via ORAL
  Filled 2014-01-10 (×3): qty 1

## 2014-01-10 MED ORDER — PANTOPRAZOLE SODIUM 40 MG IV SOLR
40.0000 mg | Freq: Every day | INTRAVENOUS | Status: DC
  Administered 2014-01-10 – 2014-01-11 (×2): 40 mg via INTRAVENOUS
  Filled 2014-01-10 (×3): qty 40

## 2014-01-10 MED ORDER — MUPIROCIN 2 % EX OINT
1.0000 "application " | TOPICAL_OINTMENT | Freq: Two times a day (BID) | CUTANEOUS | Status: DC
  Administered 2014-01-11 (×3): 1 via NASAL
  Filled 2014-01-10: qty 22

## 2014-01-10 MED ORDER — SODIUM CHLORIDE 0.9 % IV SOLN
Freq: Once | INTRAVENOUS | Status: AC
  Administered 2014-01-10: 13:00:00 via INTRAVENOUS

## 2014-01-10 MED ORDER — CHLORHEXIDINE GLUCONATE CLOTH 2 % EX PADS: 6.0000 | MEDICATED_PAD | Freq: Every day | CUTANEOUS | Status: DC

## 2014-01-10 MED ORDER — LORAZEPAM 2 MG/ML IJ SOLN
INTRAMUSCULAR | Status: AC
  Administered 2014-01-10: 0.5 mg
  Filled 2014-01-10: qty 1

## 2014-01-10 MED ORDER — DEXTROSE 5 % IV SOLN
1.0000 g | INTRAVENOUS | Status: DC
  Administered 2014-01-10 – 2014-01-11 (×2): 1 g via INTRAVENOUS
  Filled 2014-01-10 (×3): qty 10

## 2014-01-10 MED ORDER — ONDANSETRON HCL 4 MG/2ML IJ SOLN
INTRAMUSCULAR | Status: AC
  Filled 2014-01-10: qty 2

## 2014-01-10 MED ORDER — SIMVASTATIN 20 MG PO TABS
20.0000 mg | ORAL_TABLET | Freq: Every day | ORAL | Status: DC
  Administered 2014-01-10 – 2014-01-11 (×2): 20 mg via ORAL
  Filled 2014-01-10 (×4): qty 1

## 2014-01-10 MED ORDER — ONDANSETRON HCL 4 MG/2ML IJ SOLN: 4.0000 mg | Freq: Three times a day (TID) | INTRAMUSCULAR | Status: DC | PRN

## 2014-01-10 MED ORDER — CHLORHEXIDINE GLUCONATE CLOTH 2 % EX PADS
6.0000 | MEDICATED_PAD | Freq: Every morning | CUTANEOUS | Status: DC
  Administered 2014-01-11 – 2014-01-13 (×3): 6 via TOPICAL

## 2014-01-10 MED ORDER — SODIUM CHLORIDE 0.9 % IV SOLN: INTRAVENOUS | Status: DC

## 2014-01-10 MED ORDER — INSULIN ASPART 100 UNIT/ML ~~LOC~~ SOLN
0.0000 [IU] | SUBCUTANEOUS | Status: DC
  Administered 2014-01-10 – 2014-01-11 (×3): 1 [IU] via SUBCUTANEOUS

## 2014-01-10 NOTE — ED Notes (Signed)
No capture on pacer. Output increased to 80. Still no capture. Pulse checked, pt's HR was 72. Dr [name] notified and verbally ordered to trial without pacer. Pt is now off pacer.

## 2014-01-10 NOTE — Consult Note (Signed)
History and Physical   Admit date: 01/10/2014 Name:  [name] Medical record number: [id number] DOB/Age:  78-08-1922  80 y.o. female  Referring Physician:   [facility]  Reason for consultation: Bradycardia  HPI:  80 year old female has a very complex history with hypertension diabetes mellitus and multiple  previous strokes. She also has a history of bilateral pulmonary emboli and had an IVC filter placed but was not considered to be a good candidate for anticoagulation. She has been living in a nursing home and has had significant memory loss and multi-infarct dementia. She has minimal functional status and not really able to walk or participate in rehabilitation. She presented to the emergency room at [facility] with lethargy when she fell over and had a syncopal episode. She was taken to [facility] and found to be bradycardic and had an intraosseous line placed and had temporary pacemaker externally placed on several occasions. She was hypothermic on admission her initial labs were okay. Initial EKG did not show [place] elevations of EKG showed ST elevation on her EKG. She is not oriented unable to give any type of a history although she is resting quietly and not complaining of shortness of breath or chest pain.    Past Medical History  Diagnosis Date  . Stroke 2000 and 2002  . Hypertension   . Erosive gastritis 08/29/2011    Per EGD  . Hiatal hernia 08/29/2011    Per EGD  . Diverticulosis 08/30/2011    Per colonoscopy  . Peripheral neuropathy 08/30/2011  . 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 Subdural hematoma 08/2012    Status post fall  . Dysphagia, pharyngoesophageal phase 08/16/2012  . Type II or unspecified type diabetes mellitus with neurological manifestations, not stated as uncontrolled   . Acute left PCA stroke 08/16/2012  . CHF (congestive heart failure)   . Urinary tract infection, site not specified   . Muscle weakness (generalized)   . Mixed hyperlipidemia    . Embolism and thrombosis of unspecified artery   . Type II or unspecified type diabetes mellitus with neurological manifestations, not stated as uncontrolled   . Dysphagia, oral phase   . Vascular dementia, uncomplicated   . Altered mental status   . Anemia, unspecified   . 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 Acute, but ill-defined, cerebrovascular disease   . Depressive disorder, not elsewhere classified   . Adult failure to thrive      Past Surgical History  Procedure Laterality Date  . Abdominal hysterectomy    . Tonsillectomy    . Cholecystectomy    . Appendectomy    . Esophagogastroduodenoscopy  08/28/2011    Procedure: ESOPHAGOGASTRODUODENOSCOPY (EGD);  Surgeon: Malissa Hippo, MD;  [facility];  Service: Endoscopy;  Laterality: N/A;  . Colonoscopy  08/29/2011    Procedure: COLONOSCOPY;  Surgeon: Malissa Hippo, MD;  [facility];  Service: Endoscopy;  Laterality: N/A;  .  Allergies: has No Known Allergies.   Medications: Prior to Admission medications   Medication Sig Start Date End Date Taking? Authorizing Provider  furosemide (LASIX) 20 MG tablet Take 20 mg by mouth daily.   Yes Historical Provider, MD  methylphenidate (RITALIN) 5 MG tablet Take 5 mg by mouth every morning.   Yes Historical Provider, MD  mirtazapine (REMERON) 7.5 MG tablet Take 7.5 mg by mouth at bedtime.   Yes Historical Provider, MD  omeprazole (PRILOSEC OTC) 20 MG tablet Take 20 mg  by mouth every morning.    Yes Historical Provider, MD  potassium chloride SA (K-DUR,KLOR-CON) 20 MEQ tablet Take 20 mEq by mouth daily.   Yes Historical Provider, MD  simvastatin (ZOCOR) 20 MG tablet Take 20 mg by mouth daily.   Yes Historical Provider, MD    Family History:  No family status information on file.    Social History:   reports that she has never smoked. She does not have any smokeless tobacco history on file. She reports that she does not drink alcohol or use  illicit drugs.   History   Social History Narrative  . No narrative on file     Review of Systems: Unable to obtain  Physical Exam: BP 166/74  Pulse 86  Temp(Src) 96.9 F (36.1 C) (Core (Comment))  Resp 17  Ht 5\' 6"  (1.676 m)  Wt 51.71 kg (114 lb)  BMI 18.41 kg/m2  SpO2 100%  General appearance: Very thin elderly black female not oriented resting quietly Head: Normocephalic, without obvious abnormality, atraumatic Eyes: conjunctivae/corneas clear. PERRL, EOM's intact. Fundi not examined  Neck: no adenopathy, no carotid bruit, no JVD and supple, symmetrical, trachea midline Lungs: clear to auscultation bilaterally Heart: regular rate and rhythm, S1, S2 normal, no murmur, click, rub or gallop Abdomen: soft, non-tender; bowel sounds normal; no masses,  no organomegaly Extremities: No edema noted Pulses: 2+ and symmetric Neurologic:  Not oriented to date, moves all extremities  Labs: CBC  Recent Labs  01/10/14 1232  WBC 5.9  RBC 3.63*  HGB 10.5*  HCT 33.3*  PLT 240  MCV 91.7  MCH 28.9  MCHC 31.5  RDW 14.0  LYMPHSABS 2.2  MONOABS 0.4  EOSABS 0.1  BASOSABS 0.0   CMP   Recent Labs  01/10/14 1232  NA 140  K 3.9  CL 104  CO2 20  GLUCOSE 155*  BUN 9  CREATININE 0.69  CALCIUM 8.9  GFRNONAA 75*  GFRAA 87*   BNP (last 3 results) No results found for this basename: PROBNP,  in the last 8760 hours Cardiac Panel (last 3 results)  Recent Labs  01/10/14 1232  TROPONINI <0.30   Thyroid  Lab Results  Component Value Date   TSH 2.456 08/19/2012    EKG: Initial EKG shows complete heart block. Followup EKG showed an acute inferolateral myocardial infarction.  Radiology: Central venous congestion noted   IMPRESSIONS: 1. Complete heart block and second-degree heart block likely due 2 myocardial ischemia and an acute inferolateral infarction 2. Severe multi-infarct dementia with failure to thrive 3. Prior pulmonary embolus inability to  anticoagulate 4. Very limited functional status 5. Previous bilateral pulmonary emboli 6. Prior history of GI bleed  PLAN: The patient has very poor functional status and advanced dementia. She has had life-threatening pulmonary emboli as well as GI bleeds and hypercalcemia in the past.  She is not a candidate for aggressive intervention of her myocardial infarction. She has been made a no CODE BLUE which I agree with and should have comfort care and usual care. She is not a candidate for a pacemaker. I would not use beta blockers at the present time. I would not anticoagulate her at this point. Focus on comfort care.  Signed: Darden PalmerW. Spencer Arra Connaughton, Jr. MD [facility]  01/10/2014, 4:26 PM

## 2014-01-10 NOTE — Progress Notes (Signed)
Pt rhythm converted to 2nd degree block type 1 40s-50s. SBP 60s-70s-unresponsive. Spoke with MD Katrinka Blazing at [facility], aware of situation, family notified. Will cont to monitor.

## 2014-01-10 NOTE — ED Notes (Signed)
Pt being paced at rate of 60 and capture obtained at 35ma.   Pt uncomfortable from pacing, EDP aware and ativan and morphine ordered to be given slowly.

## 2014-01-10 NOTE — ED Notes (Signed)
[facility] arrived for transport. 

## 2014-01-10 NOTE — ED Provider Notes (Addendum)
CSN: [id number]     Arrival date & time 01/10/14  1120 History  This chart was scribed for Danielle HutchingBrian Vikram Tillett, MD by Nicholos Johnsenise Iheanachor, ED scribe. This patient was seen in room APA02/[facility] and the patient's care was started at 11:27 AM.    Chief Complaint  Patient presents with  . Hypotension  . Irregular Heart Beat   LEVEL 5 CAVEAT FOR DEMENTIA AND URGENT NEED FOR MEDICAL ATTENTION  The history is provided by the EMS personnel. The history is limited by the condition of the patient. No language interpreter was used.   HPI Comments: [name] is a 80 y.o. female who [facility] Emergency Department [facility]. Pt is awake currently but obtunded; responds to questions with some head nodding and groans. Reports no current pain. State she is cold. Per EMS pt was sitting up talking to staff and just fell over. Unconscious when EMS arrived. Staff recorded a systolic pressure of 60 before EMS arrived. EMS states they were unable to get blood pressure. Current BP is 107/50. Patient was placed on a temporary pacer for 10-12 minutes by EMS prior to ED arrival. State heart started beating back on its own. Pt was eating normally yesterday.   Past Medical History  Diagnosis Date  . Stroke 2000 and 2002  . Hypertension   . Erosive gastritis 08/29/2011    Per EGD  . Hiatal hernia 08/29/2011    Per EGD  . Diverticulosis 08/30/2011    Per colonoscopy  . Peripheral neuropathy 08/30/2011  . EKG abnormalities February 2013    Lateral inverted T waves  . 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 Subdural hematoma 08/2012    Status post fall  . Dysphagia, pharyngoesophageal phase 08/16/2012  . Type II or unspecified type diabetes mellitus with neurological manifestations, not stated as uncontrolled   . Acute left PCA stroke 08/16/2012  . CHF (congestive heart failure)   . Urinary tract infection, site not specified   . Muscle weakness (generalized)   . Congestive heart failure, unspecified    . Mixed hyperlipidemia   . Embolism and thrombosis of unspecified artery   . Type II or unspecified type diabetes mellitus with neurological manifestations, not stated as uncontrolled   . Dysphagia, oral phase   . Dysphagia, oral phase   . Dysphagia, oropharyngeal phase   . Vascular dementia, uncomplicated   . Altered mental status   . Anemia, unspecified   . Renal failure, unspecified   . 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 Acute, but ill-defined, cerebrovascular disease   . Acidosis   . Depressive disorder, not elsewhere classified   . Hypoxemia   . Adult failure to thrive    Past Surgical History  Procedure Laterality Date  . Abdominal hysterectomy    . Tonsillectomy    . Cholecystectomy    . Appendectomy    . Esophagogastroduodenoscopy  08/28/2011    Procedure: ESOPHAGOGASTRODUODENOSCOPY (EGD);  Surgeon: Malissa HippoNajeeb U Rehman, MD;  [facility];  Service: Endoscopy;  Laterality: N/A;  . Colonoscopy  08/29/2011    Procedure: COLONOSCOPY;  Surgeon: Malissa HippoNajeeb U Rehman, MD;  [facility];  Service: Endoscopy;  Laterality: N/A;   No family history on file. History  Substance Use Topics  . Smoking status: Never Smoker   . Smokeless tobacco: Not on file  . Alcohol Use: No   OB History   Grav Para Term Preterm Abortions TAB SAB Ect Mult Living  Review of Systems  Unable to perform ROS  Allergies  Review of patient's allergies indicates no known allergies.  Home Medications   Prior to Admission medications   Medication Sig Start Date End Date Taking? Authorizing Provider  furosemide (LASIX) 20 MG tablet Take 20 mg by mouth daily.   Yes Historical Provider, MD  methylphenidate (RITALIN) 5 MG tablet Take 5 mg by mouth every morning.   Yes Historical Provider, MD  mirtazapine (REMERON) 7.5 MG tablet Take 7.5 mg by mouth at bedtime.   Yes Historical Provider, MD  omeprazole (PRILOSEC OTC) 20 MG tablet Take 20 mg by mouth every  morning.    Yes Historical Provider, MD  potassium chloride SA (K-DUR,KLOR-CON) 20 MEQ tablet Take 20 mEq by mouth daily.   Yes Historical Provider, MD  simvastatin (ZOCOR) 20 MG tablet Take 20 mg by mouth daily.   Yes Historical Provider, MD   Triage vitals: BP 107/50  Pulse 49  Resp 24  Ht 5\' 6"  (1.676 m)  Wt 114 lb (51.71 kg)  BMI 18.41 kg/m2  SpO2 91%  Physical Exam  Nursing note and vitals reviewed. Constitutional: She appears cachectic.  Obtunded. Cachetic  HENT:  Head: Normocephalic and atraumatic.  Eyes: Conjunctivae and EOM are normal.  Cardiovascular: Regular rhythm and normal heart sounds.  Bradycardia present.   Bradycardic  Pulmonary/Chest: Effort normal and breath sounds normal.  Abdominal: Soft.  Musculoskeletal:  Unable  Neurological:  Unable  Skin: Skin is warm and dry.    ED Course  Procedures (including critical care time) DIAGNOSTIC STUDIES: Oxygen Saturation is 91% on room air, adequate by my interpretation.    COORDINATION OF CARE: At 11:32 AM: Treatment plan includes [facility] with [facility] for 3rd degree heart block. Possibility of transfer to [facility] for pacemaker   Labs Review Labs Reviewed  CBC WITH DIFFERENTIAL - Abnormal; Notable for the following:    RBC 3.63 (*)    Hemoglobin 10.5 (*)    HCT 33.3 (*)    All other components within normal limits  URINALYSIS, ROUTINE W REFLEX MICROSCOPIC - Abnormal; Notable for the following:    APPearance HAZY (*)    Protein, ur 30 (*)    Leukocytes, UA TRACE (*)    All other components within normal limits  URINE MICROSCOPIC-ADD ON - Abnormal; Notable for the following:    Bacteria, UA MANY (*)    All other components within normal limits  BASIC METABOLIC PANEL  TROPONIN I    Imaging Review No results found.   EKG Interpretation   Date/Time:  Sunday January 10 2014 12:00:24 EDT Ventricular Rate:  48 PR Interval:  338 QRS Duration: 83 QT Interval:  490 QTC Calculation:  438 R Axis:   17 Text Interpretation:  Sinus bradycardia Prolonged PR interval  Inferoposterior infarct, acute (RCA) Probable RV involvement, suggest  recording right precordial leads Confirmed by Jerimie Mancuso  MD, Altagracia Rone ([id number]) on  01/10/2014 12:45:11 PM     CRITICAL CARE Performed by: [name] Total critical care time: 60 Critical care time was exclusive of separately billable procedures and treating other patients. Critical care was necessary to treat or prevent imminent or life-threatening deterioration. Critical care was time spent personally by me on the following activities: development of treatment plan with patient and/or surrogate as well as nursing, discussions with consultants, evaluation of patient's response to treatment, examination of patient, obtaining history from patient or surrogate, ordering and performing treatments and interventions, ordering and review of laboratory studies, ordering and  review of radiographic studies, pulse oximetry and re-evaluation of patient's condition. MDM   Final diagnoses:  Bradycardia   complex patient with multisystem disease.  Patient became obtunded earlier today. Blood pressure not palpable.  EKG appears to be alternating between Mobitz ll and 3rd heart block. IV fluids, there are warmer, external pacemaker all seemed to help.  Discussed with Dr. Donnie Aho and Dr. Marchelle Gearing.  Transfer to [facility] ICU.  I personally performed the services described in this documentation, which was scribed in my presence. The recorded information has been reviewed and is accurate.     Danielle Hutching, MD 01/10/14 1244  Danielle Hutching, MD 01/14/14 1610  Danielle Hutching, MD 01/14/14 1939  Danielle Hutching, MD 01/14/14 9604  Danielle Hutching, MD 01/21/14 [phone]

## 2014-01-10 NOTE — ED Notes (Signed)
Warm fluids infusing,  Bear hugger in place.  Pt having involuntary Shaking episodes, edp aware, ekg repeated.  BP 70/40 HR decreasing to 38- 42.  Dr. [name] at bedside, preparing to pace pt.

## 2014-01-10 NOTE — Progress Notes (Signed)
CRITICAL VALUE ALERT  Critical value received:  Troponin 2.21  Date of notification:  t  Time of notification:  1807  Critical value read back:Yes.    Nurse who received alert:  Laverna Peace   MD notified (1st page):  MD Katrinka Blazing, PCCM  Time of first page:  1607  MD notified (2nd page):  Time of second page:  Responding MD:  MD Katrinka Blazing  Time MD responded:  [phone]

## 2014-01-10 NOTE — Progress Notes (Signed)
ANTIBIOTIC CONSULT NOTE - INITIAL  Pharmacy Consult for ceftriaxone Indication: UTI  No Known Allergies  Patient Measurements: Height: 5\' 6"  (167.6 cm) Weight: 114 lb (51.71 kg) IBW/kg (Calculated) : 59.3   Vital Signs: Temp: 97.1 F (36.2 C) (07/12 1600) Temp src: Core (Comment) (07/12 1600) BP: 152/64 mmHg (07/12 1600) Pulse Rate: 80 (07/12 1600) Intake/Output from previous day:   Intake/Output from this shift: Total I/O In: -  Out: 150 [Urine:150]  Labs:  Recent Labs  01/10/14 1232  WBC 5.9  HGB 10.5*  PLT 240  CREATININE 0.69   Estimated Creatinine Clearance: 39.7 ml/min (by C-G formula based on Cr of 0.69). No results found for this basename: VANCOTROUGH, Leodis Binet, VANCORANDOM, GENTTROUGH, GENTPEAK, GENTRANDOM, TOBRATROUGH, TOBRAPEAK, TOBRARND, AMIKACINPEAK, AMIKACINTROU, AMIKACIN,  in the last 72 hours   Microbiology: Recent Results (from the past 720 hour(s))  MRSA PCR SCREENING     Status: Abnormal   Collection Time    01/10/14  2:56 PM      Result Value Ref Range Status   MRSA by PCR POSITIVE (*) NEGATIVE Final   Comment:            The GeneXpert MRSA Assay (FDA     approved for NASAL specimens     only), is one component of a     comprehensive MRSA colonization     surveillance program. It is not     intended to diagnose MRSA     infection nor to guide or     monitor treatment for     MRSA infections.     RESULT CALLED TO, READ BACK BY AND VERIFIED WITH:     Armida Sans RN AT [id number] 01/10/14 BY [facility]    Medical History: Past Medical History  Diagnosis Date  . Stroke 2000 and 2002  . Hypertension   . Erosive gastritis 08/29/2011    Per EGD  . Hiatal hernia 08/29/2011    Per EGD  . Diverticulosis 08/30/2011    Per colonoscopy  . Peripheral neuropathy 08/30/2011  . 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 Subdural hematoma 08/2012    Status post fall  . Dysphagia, pharyngoesophageal phase 08/16/2012  . Type II or unspecified type diabetes  mellitus with neurological manifestations, not stated as uncontrolled   . Acute left PCA stroke 08/16/2012  . CHF (congestive heart failure)   . Urinary tract infection, site not specified   . Muscle weakness (generalized)   . Mixed hyperlipidemia   . Embolism and thrombosis of unspecified artery   . Type II or unspecified type diabetes mellitus with neurological manifestations, not stated as uncontrolled   . Dysphagia, oral phase   . Vascular dementia, uncomplicated   . Altered mental status   . Anemia, unspecified   . 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 Acute, but ill-defined, cerebrovascular disease   . Depressive disorder, not elsewhere classified   . Adult failure to thrive     Assessment: 74 YOF transferred from [facility] with lethargy and a syncopal episode. Hypothermic on arrival with normal WBC. SCr 0.69 with est CrCl ~19mL/min. Suspected UTI to start antibiotics.   Goal of Therapy:  eradication of infection  Plan:  1. Ceftriaxone 1g IV q24h 2. Follow clinical progression, LOT, c/s  Venise Ellingwood D. Javonni Macke, PharmD, BCPS Clinical Pharmacist Pager: [phone] 01/10/2014 4:52 PM

## 2014-01-10 NOTE — ED Notes (Signed)
Patient brought in via EMS from [facility]. Per EMS patient was sitting up and talking to staff and then fell over. Per EMS she was unconscious when they arrived. Per EMS unable to get blood pressure, HR 145. Appears to have Mobitz II on monitor, patient being paced by EMS.

## 2014-01-10 NOTE — H&P (Signed)
PULMONARY / CRITICAL CARE MEDICINE   Name: [name] MRN: [id number] DOB: 07-05-1922    ADMISSION DATE:  01/10/2014 CONSULTATION DATE:    Danielle Carbon MD :  [name] EDP PRIMARY SERVICE: PCCM, cards consult   CHIEF COMPLAINT:  Bradycardia/hypotension   BRIEF PATIENT DESCRIPTION:  80 year old female Per EMS pt was sitting up talking to staff and just fell over. Unconscious when EMS arrived. Staff recorded a systolic pressure of 60 before EMS arrived. EMS states they were unable to get blood pressure. On arrival to ER was hypotensive and bradycardic w/ Heart rhythm alternating between mobitz II and CHB. She was noted to be hypothermic. Treatment included: warmed IVFs, brief external pacing and bair hugger. Was transferred to [facility] for further cardiac evaluation. On admission to [facility] HR in 80s, most recent ECG showing inferior ST elevation. PCCM asked to admit given multiple medical issues in order to support cardiac evaluation.    SIGNIFICANT EVENTS / STUDIES:  7/12 had sudden syncopal episode at [facility]. Hypotensive/ unresponsive. In ER found to have Intermittent CHB, hypothermia and hypotension. Treated w/ IVFs, active warming and brief external pacing. Transferred to [facility] for further cardiac evaluation. Admitted to the PCCM service given mult medical issues. ECG just prior to arrival in [facility]  LINES / TUBES:   CULTURES: UC 7/12>>>  ANTIBIOTICS: Rocephin 7/12>>>  HISTORY OF PRESENT ILLNESS:   See above   PAST MEDICAL HISTORY :  Past Medical History  Diagnosis Date  . Stroke 2000 and 2002  . Hypertension   . Erosive gastritis 08/29/2011    Per EGD  . Hiatal hernia 08/29/2011    Per EGD  . Diverticulosis 08/30/2011    Per colonoscopy  . Peripheral neuropathy 08/30/2011  . EKG abnormalities February 2013    Lateral inverted T waves  . 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 Subdural hematoma 08/2012    Status post fall  . Dysphagia, pharyngoesophageal phase 08/16/2012  . Type II or  unspecified type diabetes mellitus with neurological manifestations, not stated as uncontrolled   . Acute left PCA stroke 08/16/2012  . CHF (congestive heart failure)   . Urinary tract infection, site not specified   . Muscle weakness (generalized)   . Congestive heart failure, unspecified   . Mixed hyperlipidemia   . Embolism and thrombosis of unspecified artery   . Type II or unspecified type diabetes mellitus with neurological manifestations, not stated as uncontrolled   . Dysphagia, oral phase   . Dysphagia, oral phase   . Dysphagia, oropharyngeal phase   . Vascular dementia, uncomplicated   . Altered mental status   . Anemia, unspecified   . Renal failure, unspecified   . 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 Acute, but ill-defined, cerebrovascular disease   . Acidosis   . Depressive disorder, not elsewhere classified   . Hypoxemia   . Adult failure to thrive    Past Surgical History  Procedure Laterality Date  . Abdominal hysterectomy    . Tonsillectomy    . Cholecystectomy    . Appendectomy    . Esophagogastroduodenoscopy  08/28/2011    Procedure: ESOPHAGOGASTRODUODENOSCOPY (EGD);  Surgeon: Malissa Hippo, MD;  Location: [facility];  Service: Endoscopy;  Laterality: N/A;  . Colonoscopy  08/29/2011    Procedure: COLONOSCOPY;  Surgeon: Malissa Hippo, MD;  Location: [facility] ENDO [facility];  Service: Endoscopy;  Laterality: N/A;   Prior to Admission medications   Medication Sig Start Date End Date Taking? Authorizing Provider  furosemide (LASIX) 20 MG tablet Take 20 mg by mouth daily.   Yes Historical Provider, MD  methylphenidate (RITALIN) 5 MG tablet Take 5 mg by mouth every morning.   Yes Historical Provider, MD  mirtazapine (REMERON) 7.5 MG tablet Take 7.5 mg by mouth at bedtime.   Yes Historical Provider, MD  omeprazole (PRILOSEC OTC) 20 MG tablet Take 20 mg by mouth every morning.    Yes Historical Provider, MD  potassium chloride SA (K-DUR,KLOR-CON) 20 MEQ  tablet Take 20 mEq by mouth daily.   Yes Historical Provider, MD  simvastatin (ZOCOR) 20 MG tablet Take 20 mg by mouth daily.   Yes Historical Provider, MD   No Known Allergies  FAMILY HISTORY:  No family history on file. SOCIAL HISTORY:  reports that she has never smoked. She does not have any smokeless tobacco history on file. She reports that she does not drink alcohol or use illicit drugs.  REVIEW OF SYSTEMS:  Unable  SUBJECTIVE:  No distress  VITAL SIGNS: Temp:  [96.4 F (35.8 C)-97.4 F (36.3 C)] 97.4 F (36.3 C) (07/12 1500) Pulse Rate:  [38-86] 83 (07/12 1500) Resp:  [13-24] 13 (07/12 1500) BP: (73-166)/(38-91) 166/74 mmHg (07/12 1500) SpO2:  [76 %-100 %] 100 % (07/12 1500) Weight:  [51.71 kg (114 lb)] 51.71 kg (114 lb) (07/12 1125) HEMODYNAMICS:   VENTILATOR SETTINGS:   INTAKE / OUTPUT: Intake/Output     07/11 0701 - 07/12 0700 07/12 0701 - 07/13 0700   Urine (mL/kg/hr)  150   Total Output   150   Net   -150          PHYSICAL EXAMINATION: General:  Chronically ill appearing 80 year old female. Not in acute distress.  Neuro:  Awake, very hard of hearing. Oriented X self only. Moved extremities  HEENT:  No JVD  Cardiovascular:  rrr Lungs:  Clear  Abdomen:  Soft, non-tender+ bowel sounds. No OM  Musculoskeletal:  Left IO no swelling in LEs  Skin:  intact  LABS: PULMONARY No results found for this basename: PHART, PCO2, PCO2ART, PO2, PO2ART, HCO3, TCO2, O2SAT,  in the last 168 hours  CBC  Recent Labs Lab 01/10/14 1232  HGB 10.5*  HCT 33.3*  WBC 5.9  PLT 240    COAGULATION No results found for this basename: INR,  in the last 168 hours  CARDIAC   Recent Labs Lab 01/10/14 1232  TROPONINI <0.30   No results found for this basename: PROBNP,  in the last 168 hours   CHEMISTRY  Recent Labs Lab 01/10/14 1232  NA 140  K 3.9  CL 104  CO2 20  GLUCOSE 155*  BUN 9  CREATININE 0.69  CALCIUM 8.9   Estimated Creatinine Clearance: 39.7  ml/min (by C-G formula based on Cr of 0.69).   LIVER No results found for this basename: AST, ALT, ALKPHOS, BILITOT, PROT, ALBUMIN, INR,  in the last 168 hours   INFECTIOUS No results found for this basename: LATICACIDVEN, PROCALCITON,  in the last 168 hours   ENDOCRINE CBG (last 3)  No results found for this basename: GLUCAP,  in the last 72 hours       IMAGING x48h  Dg Chest Port 1 View  01/10/2014   CLINICAL DATA:  Chest pain, external pacer  EXAM: PORTABLE CHEST - 1 VIEW  COMPARISON:  Chest radiograph 11/06/2012  FINDINGS: There is a external artifact over the central chest. There is mild central venous pulmonary congestion. No overt pulmonary edema.  No pneumothorax.  IMPRESSION: Central venous congestion.  No overt edema or pneumothorax.   Electronically Signed   By: Genevive Bi M.D.   On: 01/10/2014 12:54       ASSESSMENT / PLAN:  PULMONARY A: No acute  P:   Pulse ox Keep euvolemic  PCXR PRN   CARDIOVASCULAR A:  R/o STEMI w/ inferior ST elevation Transient CHB (at [facility]) w/ associated shock  Syncopal event  P:  Admit to ICU Tele  ECHO, cycle CEs Get TSH Not a candidate for anticoagulation given h/o life threatening bleed  Should be ok w/ baby asa  Cards consulted. She is not a candidate for intervention    RENAL A:   No acute  P:   Trend chemistry    GASTROINTESTINAL A:   Dysphagia H/o erosive gastritis  PCM  P:   NPO ppi    HEMATOLOGIC A:   AOCD  No evidence of acute bleeding  H/o PE April 2014. Has IVC filter due to spont hemorrhage into left hip abductor muscle  P:  Trend CBC   INFECTIOUS A:   Possible UTI  P:   UC sent Empiric rocephin   ENDOCRINE A:   Hypothermia  Dm type 2 w/ hyperglycemia  P:   Ck tsh ssi    NEUROLOGIC A:   H/o vascular dementia  H/o CVA H/o traumatic SDH Geriatric FTT  P:   RASS goal: 0 Hold sedating meds  Supportive care   TODAY'S  NP note note SUMMARY:  01/10/14: NP summary :  Currently hemodynamically stable. In setting of syncopal episode, CHB and now ST elevation in inferior leads concerned about prob STEMI. She is not a candidate for intervention. Has had continued decline w/ recurrent hospitalizations over the last year. Most notably has had massive PE about a year ago and was not able to have systemic anticoagulation as had lfe threatening hemorrhage after starting coumadin. Since this time she has had recurrent admissions and worsening failure to thrive. Not sure that we have anything to offer in this setting. NP spoke with the niece [name]. She has agreed to DNR status. We have assured her we will do our best to provide supportive care with in conservative limitations.    Anders Simmonds NP Pulmonary and Critical Care Medicine [facility] Pager: [phone]  01/10/2014, 3:14 PM   STAFF NOTE: I, Dr Lavinia Sharps have personally reviewed patient's available data, including medical history, events of note, physical examination and test results as part of my evaluation. I have discussed with resident/NP and other care providers such as pharmacist, RN and RRT.  In addition,  I personally evaluated patient and elicited key findings of - multiple medical problems but now with complete heart block and MI. After NP conversation she is DNI, DNR but continued medical care but not suitable for cath per DR Donnie Aho who is talking to family right now.  Rest per NP/medical resident whose note is outlined above and that I agree with  The patient is critically ill with multiple organ systems failure and requires high complexity decision making for assessment and support, frequent evaluation and titration of therapies, application of advanced monitoring technologies and extensive interpretation of multiple databases.   Critical Care Time devoted to patient care services described in this note is  35  Minutes.  Dr. Kalman Shan, M.D., [facility].C.P Pulmonary and Critical Care  Medicine Staff Physician [facility] Pulmonary and Critical Care Pager: [phone], If no answer  or between  15:00h - 7:00h: call [phone]  01/10/2014 4:42 PM

## 2014-01-11 DIAGNOSIS — E1149 Type 2 diabetes mellitus with other diabetic neurological complication: Secondary | ICD-10-CM

## 2014-01-11 DIAGNOSIS — I498 Other specified cardiac arrhythmias: Secondary | ICD-10-CM

## 2014-01-11 DIAGNOSIS — R627 Adult failure to thrive: Secondary | ICD-10-CM

## 2014-01-11 DIAGNOSIS — N390 Urinary tract infection, site not specified: Secondary | ICD-10-CM

## 2014-01-11 LAB — GLUCOSE, CAPILLARY
Glucose-Capillary: 143 mg/dL — ABNORMAL HIGH (ref 70–99)
Glucose-Capillary: 67 mg/dL — ABNORMAL LOW (ref 70–99)
Glucose-Capillary: 104 mg/dL — ABNORMAL HIGH (ref 70–99)
Glucose-Capillary: 93 mg/dL (ref 70–99)
Glucose-Capillary: 106 mg/dL — ABNORMAL HIGH (ref 70–99)

## 2014-01-11 LAB — BASIC METABOLIC PANEL
Anion gap: 17 — ABNORMAL HIGH (ref 5–15)
BUN: 10 mg/dL (ref 6–23)
CHLORIDE: 102 meq/L (ref 96–112)
CO2: 22 meq/L (ref 19–32)
CREATININE: 0.82 mg/dL (ref 0.50–1.10)
Calcium: 9.2 mg/dL (ref 8.4–10.5)
GFR calc non Af Amer: 62 mL/min — ABNORMAL LOW (ref >90)
GFR, EST AFRICAN AMERICAN: 72 mL/min — AB (ref >90)
Glucose, Bld: 108 mg/dL — ABNORMAL HIGH (ref 70–99)
POTASSIUM: 4.2 meq/L (ref 3.7–5.3)
Sodium: 141 mEq/L (ref 137–147)

## 2014-01-11 LAB — TROPONIN I: Troponin I: 15.68 ng/mL (ref <0.30)

## 2014-01-11 MED ORDER — DEXTROSE-NACL 5-0.45 % IV SOLN
INTRAVENOUS | Status: DC
  Administered 2014-01-11: 10:00:00 via INTRAVENOUS
  Administered 2014-01-11: 75 mL/h via INTRAVENOUS
  Administered 2014-01-12 – 2014-01-13 (×2): via INTRAVENOUS

## 2014-01-11 MED ORDER — DOPAMINE-DEXTROSE 3.2-5 MG/ML-% IV SOLN
INTRAVENOUS | Status: AC
  Filled 2014-01-11: qty 250

## 2014-01-11 MED ORDER — DOPAMINE-DEXTROSE 3.2-5 MG/ML-% IV SOLN
2.0000 ug/kg/min | INTRAVENOUS | Status: DC
  Administered 2014-01-11: 3 ug/kg/min via INTRAVENOUS

## 2014-01-11 MED ORDER — DEXTROSE 50 % IV SOLN
INTRAVENOUS | Status: AC
  Administered 2014-01-11: 50 mL via INTRAVENOUS
  Filled 2014-01-11: qty 50

## 2014-01-11 MED ORDER — DEXTROSE 50 % IV SOLN
1.0000 | Freq: Once | INTRAVENOUS | Status: AC
  Administered 2014-01-11: 50 mL via INTRAVENOUS

## 2014-01-11 NOTE — Progress Notes (Signed)
Hypoglycemic Event  CBG: 67   Treatment: D50 IV 25 mL  Symptoms: None  Follow-up CBG: Time:0930 CBG Result:104   Possible Reasons for Event: Inadequate meal intake    [name]  Remember to initiate Hypoglycemia Order Set & complete

## 2014-01-11 NOTE — Progress Notes (Addendum)
PULMONARY / CRITICAL CARE MEDICINE   Name: [name] MRN: [id number] DOB: Jun 27, 1923    ADMISSION DATE:  01/10/2014  REFERRING MD :  EDP PRIMARY SERVICE:  PCCM  CHIEF COMPLAINT:  Bradycardia / hypotension   BRIEF PATIENT DESCRIPTION: 80 yo NH resident with advanced dementia and failure to thrive seen in [facility] on 7/12 with syncope / acute encephalopathy. Found to be in heart block, bradycardic, hypotensive. Transferred to [facility] for possible cardiology intervention.  SIGNIFICANT EVENTS / STUDIES:  7/12  Seen by Cardiology Donnie Aho ) >>> Not a candidate for anticoagulation, cath lab or pacemaker.  Comfort / usual care recommended.  Made DNR.   LINES / TUBES:   CULTURES: 7/12  Urine >>>  ANTIBIOTICS: Rocephin 7/12 >>>  INTERVAL HISTORY:  Briefly on Dopamine. Off now.  VITAL SIGNS: Temp:  [96.4 F (35.8 C)-99.5 F (37.5 C)] 98.5 F (36.9 C) (07/13 0811) Pulse Rate:  [38-86] 79 (07/13 0700) Resp:  [11-24] 15 (07/13 0700) BP: (60-166)/(29-91) 137/75 mmHg (07/13 0700) SpO2:  [76 %-100 %] 100 % (07/13 0700) Weight:  [44 kg (97 lb)-51.71 kg (114 lb)] 44 kg (97 lb) (07/13 0500)  HEMODYNAMICS:   VENTILATOR SETTINGS:   INTAKE / OUTPUT: Intake/Output     07/12 0701 - 07/13 0700 07/13 0701 - 07/14 0700   I.V. (mL/kg) 629.9 (14.3)    IV Piggyback 50    Total Intake(mL/kg) 679.9 (15.5)    Urine (mL/kg/hr) 620    Emesis/NG output 1    Total Output 621     Net +58.9           PHYSICAL EXAMINATION: General:  Cachectic, in no distress Neuro:  Awake, confused HEENT:  No JVD  Cardiovascular:  Regular, no murmurs Lungs:  CTAB Abdomen:  Soft, bowel sounds present Musculoskeletal:  Moves all extremities Skin:  Intact  LABS: CBC  Recent Labs Lab 01/10/14 1232  WBC 5.9  HGB 10.5*  HCT 33.3*  PLT 240   Coag's No results found for this basename: APTT, INR,  in the last 168 hours BMET  Recent Labs Lab 01/10/14 1232  NA 140  K 3.9  CL 104  CO2 20  BUN 9   CREATININE 0.69  GLUCOSE 155*   Electrolytes  Recent Labs Lab 01/10/14 1232  CALCIUM 8.9   Sepsis Markers No results found for this basename: LATICACIDVEN, PROCALCITON, O2SATVEN,  in the last 168 hours  ABG No results found for this basename: PHART, PCO2ART, PO2ART,  in the last 168 hours  Liver Enzymes No results found for this basename: AST, ALT, ALKPHOS, BILITOT, ALBUMIN,  in the last 168 hours  Cardiac Enzymes  Recent Labs Lab 01/10/14 1650 01/10/14 2213 01/11/14 0417  TROPONINI 2.21* 9.27* 15.68*  PROBNP 1016.0*  --   --    Glucose  Recent Labs Lab 01/10/14 1636 01/10/14 1952 01/11/14 0003 01/11/14 0807  GLUCAP 141* 131* 143* 67*   IMAGING:  Dg Chest Port 1 View  01/10/2014   CLINICAL DATA:  Chest pain, external pacer  EXAM: PORTABLE CHEST - 1 VIEW  COMPARISON:  Chest radiograph 11/06/2012  FINDINGS: There is a external artifact over the central chest. There is mild central venous pulmonary congestion. No overt pulmonary edema. No pneumothorax.  IMPRESSION: Central venous congestion.  No overt edema or pneumothorax.   Electronically Signed   By: Genevive Bi M.D.   On: 01/10/2014 12:54   ASSESSMENT / PLAN:  STEMI Complete heart block Second degree heart block H/o PE  s/p IVC filter, unable to anticoagulate secondary to recent spontaneous hemorrhage to hip abductor muscle  Advanced dementia Failure to thrive Dysphagia Possible UTI DM II Hyperglycemia Hypoglycemia   Not a candidate for cardiac cath, pacemaker, anticoagulation ( per Cardiology )  ASA / Zocor  Ceftriaxone / follow urine cx  Change IVF to D5 1/2 NS @ 75  SSI / CBG  Protonix as preadmission   SCDs  SLP eval  DNI / DNR  Transfer to SDU  TRH to assume care 7/14, PCCM will sign off  I have personally obtained history, examined patient, evaluated and interpreted laboratory and imaging results, reviewed medical records, formulated assessment / plan and placed  orders.  Lonia FarberZUBELEVITSKIY, Daily Doe, MD Pulmonary and Critical Care Medicine [facility] Pager: [phone]  01/11/2014, 8:32 AM

## 2014-01-11 NOTE — Progress Notes (Signed)
CRITICAL VALUE ALERT  Critical value received:  Troponin 9.27  Date of notification:  01/11/14  Time of notification:  0035  Critical value read back:Yes.    Nurse who received aler Lang Snow:    MD notified (1st page):  Dr Sung Amabile  Time of first page:  0035  MD notified (2nd page):  Time of second page:  Responding MD:  Dr Sung Amabile  Time MD responded:  [phone]

## 2014-01-11 NOTE — Progress Notes (Signed)
Utilization Review Completed.[name] T7/13/2015  

## 2014-01-11 NOTE — Progress Notes (Signed)
eLink Physician-Brief Progress Note Patient Name: [name] DOB: December 11, 1922 MRN: [id number]  Date of Service  01/11/2014   HPI/Events of Note  Hypotensive with Mobitz I HB in setting of acute inf wall MI   eICU Interventions  DA infusion ordered to maintain HR > 50 and MAP > 60 mmHg   Intervention Category Major Interventions: Arrhythmia - evaluation and management  Billy Fischer 01/11/2014, 1:20 AM

## 2014-01-11 NOTE — Evaluation (Signed)
Clinical/Bedside Swallow Evaluation Patient Details  Name: [name] MRN: [id number] Date of Birth: 04/15/1923  Today's Date: 01/11/2014 Time: [id number] SLP Time Calculation (min): 9 min  Past Medical History:  Past Medical History  Diagnosis Date  . Stroke 2000 and 2002  . Hypertension   . Erosive gastritis 08/29/2011    Per EGD  . Hiatal hernia 08/29/2011    Per EGD  . Diverticulosis 08/30/2011    Per colonoscopy  . Peripheral neuropathy 08/30/2011  . 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 Subdural hematoma 08/2012    Status post fall  . Dysphagia, pharyngoesophageal phase 08/16/2012  . Type II or unspecified type diabetes mellitus with neurological manifestations, not stated as uncontrolled   . Acute left PCA stroke 08/16/2012  . CHF (congestive heart failure)   . Urinary tract infection, site not specified   . Muscle weakness (generalized)   . Mixed hyperlipidemia   . Embolism and thrombosis of unspecified artery   . Type II or unspecified type diabetes mellitus with neurological manifestations, not stated as uncontrolled   . Dysphagia, oral phase   . Vascular dementia, uncomplicated   . Altered mental status   . Anemia, unspecified   . 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 Acute, but ill-defined, cerebrovascular disease   . Depressive disorder, not elsewhere classified   . Adult failure to thrive    Past Surgical History:  Past Surgical History  Procedure Laterality Date  . Abdominal hysterectomy    . Tonsillectomy    . Cholecystectomy    . Appendectomy    . Esophagogastroduodenoscopy  08/28/2011    Procedure: ESOPHAGOGASTRODUODENOSCOPY (EGD);  Surgeon: Malissa HippoNajeeb U Rehman, MD;  Location[facility];  Service: Endoscopy;  Laterality: N/A;  . Colonoscopy  08/29/2011    Procedure: COLONOSCOPY;  Surgeon: Malissa HippoNajeeb U Rehman, MD;  Location: [facility];  Service: Endoscopy;  Laterality: N/A;   HPI:  80 year old female admitted after sudden unresponsiveness.  PMH:  hiatal hernia, left CVA, dementia, SDH, dysphagia, DM, renal failure.   Pt. found to have STEMI, complete heart block.  CXR 7/12 Central venous congestion. No overt edema or pneumothorax.  [facility] Dys 1 texture and thin liquids   Assessment / Plan / Recommendation Clinical Impression  Pt. alert, cooperative, generalized weakness.  Pt. underwent bedside swallow assessment 08/2012 with puree and thin liquids recommended.  Consumed soft texture and thin liquids via straw without indications aspiration, vocal quality clear.  Risk factors include dementia and poor endurance/reserve with FTT.  Recommend Dys 1 diet and thin liquids, straws okay and pills whole in appleauce, general swallow precautions.  No follow up needed while in hospital.     Aspiration Risk  Moderate    Diet Recommendation Dysphagia 1 (Puree);Thin liquid   Liquid Administration via: Cup;Straw Medication Administration: Whole meds with puree Supervision: Staff to assist with self feeding;Patient able to self feed;Full supervision/cueing for compensatory strategies Compensations: Slow rate;Small sips/bites;Check for pocketing Postural Changes and/or Swallow Maneuvers: Seated upright 90 degrees;Upright 30-60 min after meal    Other  Recommendations Oral Care Recommendations: Oral care BID   Follow Up Recommendations  None    Frequency and Duration        Pertinent Vitals/Pain WDL        Swallow Study         Oral/Motor/Sensory Function Overall Oral Motor/Sensory Function:  (generalized weakness)   Ice Chips Ice chips: Not tested   Thin  Liquid Thin Liquid: Within functional limits Presentation: Spoon;Straw    Nectar Thick Nectar Thick Liquid: Not tested   Honey Thick Honey Thick Liquid: Not tested   Puree Puree: Within functional limits   Solid   GO    Solid: Not tested       Royce Macadamia M.Ed [employer] [phone]  01/11/2014

## 2014-01-11 NOTE — Progress Notes (Signed)
Pt transferred to 2H22, report called to RN, all belongings sent with patient. Niece [name] called and updated on where patient was moved. Pt stable at time of transport

## 2014-01-11 NOTE — Progress Notes (Signed)
INITIAL NUTRITION ASSESSMENT  DOCUMENTATION CODES Per approved criteria  -Underweight   INTERVENTION: Diet advancement per SLP/MD Provide Glucerna Shakes BID when diet advanced RD to continue to monitor  NUTRITION DIAGNOSIS: Predicted sub optimal energy intake related advanced dementia and current NPO status as evidenced by underweight BMI.   Goal: Pt to meet >/= 90% of their estimated nutrition needs   Monitor:  Diet advancement, PO intake, weight, labs  Reason for Assessment: Low Braden  80 y.o. female  Admitting Dx: <principal problem not specified>  ASSESSMENT: 80 yo NH resident with advanced dementia and failure to thrive seen in [facility] on 7/12 with syncope / acute encephalopathy. Found to be in heart block, bradycardic, hypotensive. [facility] cardi[facility] intervention.   Pt with low Braden score; at risk of skin breakdown- stage 1 pressure ulcer on sacrum. Per H&P pt has a history of T2DM, CHF, stroke and adult FTT. Pt is currently NPO; per RN awaiting SLP evaluation due to pt's history of dysphagia. Pt is a poor historian and provided limited information. Pt denies having an appetite and denies using nutritional supplements PTA.  Weight history shows pt has lost 32% of her body weight since April 2014.   Suspect moderate malnutrition of chronic illness but, unable to confirm if pt meets criteria due to limited history.   Nutrition Focused Physical Exam:  Subcutaneous Fat:  Orbital Region: wnl Upper Arm Region: mild wasting Thoracic and Lumbar Region: NA  Muscle:  Temple Region: moderate wasting Clavicle Bone Region: moderate wasting Clavicle and Acromion Bone Region: mild wasting Scapular Bone Region: NA Dorsal Hand: moderate wasting Patellar Region: severe wasting Anterior Thigh Region: moderate wasting Posterior Calf Region: moderate wasting  Edema: none noted   Height: Ht Readings from Last 1 Encounters:  01/10/14 5\' 6"  (1.676 m)     Weight: Wt Readings from Last 1 Encounters:  01/11/14 97 lb (44 kg)    Ideal Body Weight: 130 lbs  % Ideal Body Weight: 75%  Wt Readings from Last 10 Encounters:  01/11/14 97 lb (44 kg)  11/06/12 114 lb 1.6 oz (51.755 kg)  10/07/12 143 lb 8.3 oz (65.1 kg)  08/19/12 147 lb 11.3 oz (67 kg)  08/28/11 132 lb 14.4 oz (60.283 kg)  08/28/11 132 lb 14.4 oz (60.283 kg)  08/28/11 132 lb 14.4 oz (60.283 kg)    Usual Body Weight: >114 lbs  % Usual Body Weight: 85%  BMI:  Body mass index is 15.66 kg/(m^2).  Estimated Nutritional Needs: Kcal: 1300-1500 Protein: 55-65 grams Fluid: 1.3 L/day  Skin: stage 1 pressure ulcer to mid sacrum  Diet Order: NPO  EDUCATION NEEDS: -No education needs identified at this time   Intake/Output Summary (Last 24 hours) at 01/11/14 1037 Last data filed at 01/11/14 1000  Gross per 24 hour  Intake  947.4 ml  Output    621 ml  Net  326.4 ml    Last BM: PTA   Labs:   Recent Labs Lab 01/10/14 1232 01/11/14 0417  NA 140 141  K 3.9 4.2  CL 104 102  CO2 20 22  BUN 9 10  CREATININE 0.69 0.82  CALCIUM 8.9 9.2  GLUCOSE 155* 108*    CBG (last 3)   Recent Labs  01/11/14 0003 01/11/14 0807 01/11/14 0944  GLUCAP 143* 67* 104*    Scheduled Meds: . aspirin  81 mg Oral Daily  . cefTRIAXone (ROCEPHIN)  IV  1 g Intravenous Q24H  . Chlorhexidine Gluconate Cloth  6 each Topical q morning - 10a  . mupirocin ointment  1 application Nasal BID  . pantoprazole (PROTONIX) IV  40 mg Intravenous QHS  . simvastatin  20 mg Oral q1800    Continuous Infusions: . dextrose 5 % and 0.45% NaCl 75 mL/hr at 01/11/14 0946    Past Medical History  Diagnosis Date  . Stroke 2000 and 2002  . Hypertension   . Erosive gastritis 08/29/2011    Per EGD  . Hiatal hernia 08/29/2011    Per EGD  . Diverticulosis 08/30/2011    Per colonoscopy  . Peripheral neuropathy 08/30/2011  . 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 Subdural hematoma 08/2012    Status  post fall  . Dysphagia, pharyngoesophageal phase 08/16/2012  . Type II or unspecified type diabetes mellitus with neurological manifestations, not stated as uncontrolled   . Acute left PCA stroke 08/16/2012  . CHF (congestive heart failure)   . Urinary tract infection, site not specified   . Muscle weakness (generalized)   . Mixed hyperlipidemia   . Embolism and thrombosis of unspecified artery   . Type II or unspecified type diabetes mellitus with neurological manifestations, not stated as uncontrolled   . Dysphagia, oral phase   . Vascular dementia, uncomplicated   . Altered mental status   . Anemia, unspecified   . 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 Acute, but ill-defined, cerebrovascular disease   . Depressive disorder, not elsewhere classified   . Adult failure to thrive     Past Surgical History  Procedure Laterality Date  . Abdominal hysterectomy    . Tonsillectomy    . Cholecystectomy    . Appendectomy    . Esophagogastroduodenoscopy  08/28/2011    Procedure: ESOPHAGOGASTRODUODENOSCOPY (EGD);  Surgeon: Malissa Hippo, MD;  Location: [facility];  Service: Endoscopy;  Laterality: N/A;  . Colonoscopy  08/29/2011    Procedure: COLONOSCOPY;  Surgeon: Malissa Hippo, MD;  Location: [facility];  Service: Endoscopy;  Laterality: N/A;    Ian Malkin RD, LDN Inpatient Clinical Dietitian Pager: [phone] After Hours Pager: [phone]

## 2014-01-11 NOTE — Progress Notes (Signed)
Subjective:  NO c/o today.  Had some further bradycardia last night and CCM ordered dopamine that was later d/c. No c/o chest pain or SOB although history difficult.   Objective:  Vital Signs in the last 24 hours: BP 137/75  Pulse 79  Temp(Src) 98.5 F (36.9 C) (Core (Comment))  Resp 15  Ht 5\' 6"  (1.676 m)  Wt 44 kg (97 lb)  BMI 15.66 kg/m2  SpO2 100%  Physical Exam: Thin elderly female in NAD,  Lungs:  Clear Cardiac:  Regular rhythm, normal S1 and S2, no S3 Extremities:  No edema present Neuro:  Confused not oriented  Intake/Output from previous day: 07/12 0701 - 07/13 0700 In: 679.9 [I.V.:629.9; IV Piggyback:50] Out: 621 [Urine:620; Emesis/NG output:1]  Weight Filed Weights   01/10/14 1125 01/11/14 0500  Weight: 51.71 kg (114 lb) 44 kg (97 lb)    Lab Results: Basic Metabolic Panel:  Recent Labs  88/28/00 1232 01/11/14 0417  NA 140 141  K 3.9 4.2  CL 104 102  CO2 20 22  GLUCOSE 155* 108*  BUN 9 10  CREATININE 0.69 0.82   CBC:  Recent Labs  01/10/14 1232  WBC 5.9  NEUTROABS 3.2  HGB 10.5*  HCT 33.3*  MCV 91.7  PLT 240   Cardiac Enzymes:  Recent Labs  01/10/14 1650 01/10/14 2213 01/11/14 0417  TROPONINI 2.21* 9.27* 15.68*    Telemetry: Sinus today, had additional CHB overnight  Assessment/Plan:  1. Evolving inferior MI 2. Heart block both second degree and complete due to MI 3. Hypoglycemia 4. Advanced dementia  Rec:  Move to floor.  Not candidate for cardiac intervention due to severe dementia and other comorbidities.  Consider d/c telemetry after tomorrow. Currently DNR.   Darden Palmer  MD [facility]  01/11/2014, 8:47 AM

## 2014-01-12 DIAGNOSIS — F0390 Unspecified dementia without behavioral disturbance: Secondary | ICD-10-CM | POA: Diagnosis present

## 2014-01-12 MED ORDER — GLUCERNA SHAKE PO LIQD
237.0000 mL | Freq: Two times a day (BID) | ORAL | Status: DC
  Administered 2014-01-12 – 2014-01-13 (×2): 237 mL via ORAL

## 2014-01-12 NOTE — Progress Notes (Signed)
[name]  [name] ZOX:[id number] DOB: 07/27/1922 DOA: 01/10/2014 PCP: Colette RibasGOLDING, JOHN CABOT, MD   Brief narrative: 80 yo NH resident with advanced dementia and failure to thrive seen in [facility] on 7/12 with syncope / acute encephalopathy. Found to be in heart block, bradycardic, hypotensive. [facility] for possible cardiology intervention. Due to advanced dementia she deemed not to be a candidate for aggressive cardiac interventions. She continued to have bradycardia land was ordered dopamine that was later d/c'd. TNI continues to rise and the Cardiologist suspects evolving inferior MI. She has wavered between 2nd and 3rd degree HB with variable hemodynamically stable. She is a DNR.   HPI/Subjective: Pt awake and pleasant albeit confused (per grand-daughter at bedside)-wants orange juice  Assessment/Plan: Active Problems:   Syncope and collapse due to:   A) Complete heart block   B) STEMI (ST elevation myocardial infarction) -per Cards notes not a candidate for cardiac intervention 2/2 severe dementia and other medical co morbidities -BP somewhat stable despite return to CHB -TNI has risen to 15.68 (would not ck again) and with underlying CHB suspect further decrease in cardiac perfusion and further progression in ischemia-this was d/w grand daughter at bedside- rec have all interested family come to hospital to interact with pt before she declines further and becomes unresponsive-emotional suppport given    Type II or unspecified type diabetes mellitus with neurological manifestations, not stated as uncontrolled(250.60) -cbgs stable but as comfort measure will continue dextrose IVFs for now    Adult failure to thrive/Dementia -as above    Anemia    HTN (hypertension), malignant    Hypothermia -resolved   DVT prophylaxis: Not indicated during end-of-life care Code Status: DO NOT RESUSCITATE Family Communication: Extensive  discussion with the patient's granddaughter at bedside today updating her on patient's end-of-life status including anticipation that life span limited to as little as 24 hours but potentially up to several more weeks Disposition Plan/Expected LOS: Transfer to floor   Consultants: Cardiology PCCM  Procedures: None  Cultures: None  Antibiotics: None  Objective: Blood pressure 126/70, pulse 75, temperature 97.8 F (36.6 C), temperature source Oral, resp. rate 18, height 5\' 6"  (1.676 m), weight 97 lb (44 kg), SpO2 94.00%.  Intake/Output Summary (Last 24 hours) at 01/12/14 1314 Last data filed at 01/12/14 0900  Gross per 24 hour  Intake   1580 ml  Output    161 ml  Net   1419 ml     Exam: General: No acute respiratory distress-somewhat lethargic but easy to awaken and attempts to interact with staff appropriately although she remains confused-appears quite thin Lungs: Clear to auscultation bilaterally without wheezes or crackles, RA Cardiovascular: Complete heart block noted on upper bed telemetry without murmur gallop or rub normal S1 and S2, no peripheral edema or JVD Abdomen: Nontender, nondistended, soft, bowel sounds positive, no rebound, no ascites, no appreciable mass Musculoskeletal: No significant cyanosis, clubbing of bilateral lower extremities   Scheduled Meds:  Scheduled Meds: . aspirin  81 mg Oral Daily  . Chlorhexidine Gluconate Cloth  6 each Topical q morning - 10a  . feeding supplement (GLUCERNA SHAKE)  237 mL Oral BID BM  . simvastatin  20 mg Oral q1800   Continuous Infusions: . dextrose 5 % and 0.45% NaCl 75 mL/hr at 01/12/14 1104    Data Reviewed: Basic Metabolic Panel:  Recent Labs Lab 01/10/14 1232 01/11/14 0417  NA 140 141  K 3.9 4.2  CL 104 102  CO2 20 22  GLUCOSE 155* 108*  BUN 9 10  CREATININE 0.69 0.82  CALCIUM 8.9 9.2   Liver Function Tests: No results found for this basename: AST, ALT, ALKPHOS, BILITOT, PROT, ALBUMIN,  in the  last 168 hours No results found for this basename: LIPASE, AMYLASE,  in the last 168 hours No results found for this basename: AMMONIA,  in the last 168 hours CBC:  Recent Labs Lab 01/10/14 1232  WBC 5.9  NEUTROABS 3.2  HGB 10.5*  HCT 33.3*  MCV 91.7  PLT 240   Cardiac Enzymes:  Recent Labs Lab 01/10/14 1232 01/10/14 1650 01/10/14 2213 01/11/14 0417  TROPONINI <0.30 2.21* 9.27* 15.68*   BNP (last 3 results)  Recent Labs  01/10/14 1650  PROBNP 1016.0*   CBG:  Recent Labs Lab 01/11/14 0003 01/11/14 0353 01/11/14 0807 01/11/14 0944 01/11/14 1203  GLUCAP 143* 106* 67* 104* 93    Recent Results (from the past 240 hour(s))  MRSA PCR SCREENING     Status: Abnormal   Collection Time    01/10/14  2:56 PM      Result Value Ref Range Status   MRSA by PCR POSITIVE (*) NEGATIVE Final   Comment:            The GeneXpert MRSA Assay (FDA     approved for NASAL specimens     only), is one component of a     comprehensive MRSA colonization     surveillance program. It is not     intended to diagnose MRSA     infection nor to guide or     monitor treatment for     MRSA infections.     RESULT CALLED TO, READ BACK BY AND VERIFIED WITH:     Armida Sans RN AT 5631 01/10/14 BY WOOLLENK  URINE CULTURE     Status: None   Collection Time    01/10/14  5:20 PM      Result Value Ref Range Status   Specimen Description URINE, CATHETERIZED   Final   Special Requests NONE   Final   Culture  Setup Time     Final   Value: 01/10/2014 23:25     Performed at [facility]   Colony Count     Final   Value: 70,000 COLONIES/ML     Performed at [facility]   Culture     Final   Value: GRAM POSITIVE COCCI     Performed at [facility]   Report Status PENDING   Incomplete     Studies:  Recent x-ray studies have been reviewed in detail by the Attending Physician  Time spent :      Junious Silk, ANP [facility] Office   [phone] Pager [phone]   **If unable to reach the above provider after paging please contact the Flow Manager @ [phone]  On-Call/Text Page:      [email or web address]      password [email or web address]  If 7PM-7AM, please contact night-coverage [email or web address] Password [email or web address] 01/12/2014, 1:14 PM   LOS: 2 days   I have examined the patient, reviewed the chart and modified the above Atkinson which I agree with.   Dyamond Tolosa,MD Pager # on [email or web address] 01/12/2014, 5:24 PM

## 2014-01-12 NOTE — Progress Notes (Signed)
Report called to 5W .Niece and sister notified of pt room change. Grandtr at bedside. Barbera Setters RN

## 2014-01-12 NOTE — Progress Notes (Signed)
Patient trasfered from [facility] to 5W30 via bed; alert and oriented to herself; no complaints of pain in her legs; IV  In LFA running D5 - 0.45NS at 75cc/hr; at skin assessment - abrasion on right knee and  on right side of her face (due to an old fall). Patient is accompanied by her granddaughter; Orient patient to room and unit; instructed how to use the call bell and  fall risk precautions. Will continue to monitor the patient.

## 2014-01-12 NOTE — Progress Notes (Signed)
A.Ellis,NP notified of pt in CHB rate 70's. Bp 101/83. NO new orders. Barbera Setters  RN

## 2014-01-12 NOTE — Progress Notes (Signed)
Subjective:  On floor currently.  Lying in bed quietly, will not open eyes will speak when spoken to.   Objective:  Vital Signs in the last 24 hours: BP 126/70  Pulse 75  Temp(Src) 97.8 F (36.6 C) (Oral)  Resp 18  Ht 5\' 6"  (1.676 m)  Wt 44 kg (97 lb)  BMI 15.66 kg/m2  SpO2 94%  Physical Exam: Thin elderly female in NAD,  Lungs:  Clear Cardiac:  Regular rhythm, normal S1 and S2, no S3, Neck veins may be up slightly. Extremities:  No edema present Neuro:  Confused not oriented  Intake/Output from previous day: 07/13 0701 - 07/14 0700 In: 1852.5 [P.O.:60; I.V.:1742.5; IV Piggyback:50] Out: 241 [Urine:240; Stool:1]  Weight Filed Weights   01/10/14 1125 01/11/14 0500  Weight: 51.71 kg (114 lb) 44 kg (97 lb)    Lab Results: Basic Metabolic Panel:  Recent Labs  09/81/19 1232 01/11/14 0417  NA 140 141  K 3.9 4.2  CL 104 102  CO2 20 22  GLUCOSE 155* 108*  BUN 9 10  CREATININE 0.69 0.82   CBC:  Recent Labs  01/10/14 1232  WBC 5.9  NEUTROABS 3.2  HGB 10.5*  HCT 33.3*  MCV 91.7  PLT 240   Cardiac Enzymes:  Recent Labs  01/10/14 1650 01/10/14 2213 01/11/14 0417  TROPONINI 2.21* 9.27* 15.68*    Telemetry: currently off of telemtry  Assessment/Plan:  1. Evolving inferior MI 2. Heart block both second degree and complete due to MI 3. Hypoglycemia 4. Advanced dementia  Rec:  CLinically probably over the worst of the MI.  Long term outlook poor.  Currently DNR. I would not use beta blocker or ACE at this time due to borderline BP and CHG intermittent. Family updated.   Darden Palmer  MD [facility] Card[facility]  01/12/2014, 1:41 PM

## 2014-01-13 MED ORDER — LORAZEPAM 2 MG/ML PO CONC: 1.0000 mg | ORAL | Status: AC | PRN

## 2014-01-13 MED ORDER — GLUCERNA SHAKE PO LIQD: 237.0000 mL | Freq: Two times a day (BID) | ORAL | Status: AC

## 2014-01-13 MED ORDER — ASPIRIN 81 MG PO CHEW: 81.0000 mg | CHEWABLE_TABLET | Freq: Every day | ORAL | Status: AC

## 2014-01-13 MED ORDER — MORPHINE SULFATE (CONCENTRATE) 10 MG /0.5 ML PO SOLN: 5.0000 mg | ORAL | Status: AC | PRN

## 2014-01-13 NOTE — Progress Notes (Signed)
Subjective:  Awake and opens eyes and speaks.  Not SOB, no chest pain.  Objective:  Vital Signs in the last 24 hours: BP 104/63  Pulse 72  Temp(Src) 99.7 F (37.6 C) (Oral)  Resp 20  Ht 5\' 6"  (1.676 m)  Wt 44 kg (97 lb)  BMI 15.66 kg/m2  SpO2 99%  Physical Exam: Thin elderly female in NAD,  Lungs:  Clear Cardiac:  Regular rhythm, normal S1 and S2, no S3,  Extremities:  No edema present Neuro:  Confused not oriented but speaks and   Intake/Output from previous day: 07/14 0701 - 07/15 0700 In: 711.3 [P.O.:120; I.V.:591.3] Out: 50 [Urine:50]  Weight Filed Weights   01/10/14 1125 01/11/14 0500  Weight: 51.71 kg (114 lb) 44 kg (97 lb)    Lab Results: Basic Metabolic Panel:  Recent Labs  49/82/64 1232 01/11/14 0417  NA 140 141  K 3.9 4.2  CL 104 102  CO2 20 22  GLUCOSE 155* 108*  BUN 9 10  CREATININE 0.69 0.82   CBC:  Recent Labs  01/10/14 1232  WBC 5.9  NEUTROABS 3.2  HGB 10.5*  HCT 33.3*  MCV 91.7  PLT 240   Cardiac Enzymes:  Recent Labs  01/10/14 1650 01/10/14 2213 01/11/14 0417  TROPONINI 2.21* 9.27* 15.68*    Telemetry: currently off of telemtry  Assessment/Plan:  1. Recent inf MI 2. Heart block both second degree and complete due to MI  This is usually transient.  She is not a candidate for perm pacer due to severe dementia and poor functional status 3.  Advanced dementia  Rec:  Currently DNR. I would not use beta blocker or ACE at this time due to borderline BP and CHG intermittent. Family updated. OK from cardiac viewpoint to go back to SNF.  Will sign off.   Darden Palmer  MD [facility]  01/13/2014, 8:52 AM

## 2014-01-13 NOTE — Progress Notes (Signed)
Nsg Discharge Note  Admit Date:  01/10/2014 Discharge date: 01/13/2014   [name] to be D/C'd Rehab per MD order.  AVS completed.  Copy for chart, and copy for patient signed, and dated. Patient/caregiver able to verbalize understanding.  Discharge Medication:   Medication List    STOP taking these medications       furosemide 20 MG tablet  Commonly known as:  LASIX     methylphenidate 5 MG tablet  Commonly known as:  RITALIN     potassium chloride SA 20 MEQ tablet  Commonly known as:  K-DUR,KLOR-CON      TAKE these medications       aspirin 81 MG chewable tablet  Chew 1 tablet (81 mg total) by mouth daily.     feeding supplement (GLUCERNA SHAKE) Liqd  Take 237 mLs by mouth 2 (two) times daily between meals.     LORazepam 2 MG/ML concentrated solution  Commonly known as:  ATIVAN  Take 0.5 mLs (1 mg total) by mouth every 4 (four) hours as needed for anxiety or sedation.     mirtazapine 7.5 MG tablet  Commonly known as:  REMERON  Take 7.5 mg by mouth at bedtime.     morphine CONCENTRATE 10 mg / 0.5 ml concentrated solution  Take 0.25 mLs (5 mg total) by mouth every 4 (four) hours as needed for moderate pain, severe pain or shortness of breath.     omeprazole 20 MG tablet  Commonly known as:  PRILOSEC OTC  Take 20 mg by mouth every morning.     simvastatin 20 MG tablet  Commonly known as:  ZOCOR  Take 20 mg by mouth daily.        Discharge Assessment: Filed Vitals:   01/13/14 1410  BP: 97/63  Pulse: 73  Temp: 97.8 F (36.6 C)  Resp: 18  Abrasions on eye toe and knee.  IV catheter discontinued intact. Site without signs and symptoms of complications - no redness or edema noted at insertion site, patient denies c/o pain - only slight tenderness at site.  Dressing with slight pressure applied.  D/c Instructions-Education: Discharge instructions given to patient/family with verbalized understanding. D/c education completed with patient/family including  follow up instructions, medication list, d/c activities limitations if indicated, with other d/c instructions as indicated by MD - patient able to verbalize understanding, all questions fully answered. Patient instructed to return to ED, call 911, or call MD for any changes in condition.  Patient escorted via WC, and D/C home via private auto.  Rithik Odea [name], RN 01/13/2014 2:59 PM

## 2014-01-13 NOTE — Clinical Social Work Psychosocial (Signed)
Clinical Social Work Department BRIEF PSYCHOSOCIAL ASSESSMENT 01/13/2014  Patient:  [name], [name]     Account Number:  [id number]     Admit date:  01/10/2014  Clinical Social Worker:  Lavell Luster  Date/Time:  01/13/2014 10:13 AM  Referred by:  Physician  Date Referred:  01/13/2014 Referred for  SNF Placement   Other Referral:   Interview type:  Family Other interview type:   Patient's niece interviewed by phone as no family at bedside.    PSYCHOSOCIAL DATA Living Status:  FACILITY Admitted from facility:  [facility] Level of care:  Skilled Nursing Facility Primary support name:  [name] Primary support relationship to patient:  FAMILY Degree of support available:   Support is good.    CURRENT CONCERNS Current Concerns  Post-Acute Placement   Other Concerns:    SOCIAL WORK ASSESSMENT / PLAN CSW spoke with patient's neice [name] to complete assessment. [name] states that the patient is a resident of [facility] in [place] and states that she plans for the patient to discharge back to the facility. Niece is happy with the care the patient receives at the facility. CSW explained that MD is ready for patient to discharge to facility. [name] is agreeable to this. CSW explained that ambulance transport will be set up this afternoon.   Assessment/plan status:  Psychosocial Support/Ongoing Assessment of Needs Other assessment/ plan:   Complete FL2, Fax, PASRR   Information/referral to community resources:   CSW contact information given to niece.    PATIENT'S/FAMILY'S RESPONSE TO PLAN OF CARE: Patient will be discharged back to [facility] in [place] at discharge. CSW will assist.         Roddie Mc MSW, [place], [place], [phone]

## 2014-01-13 NOTE — Care Management Note (Signed)
    Page 1 of 1   01/13/2014     1:59:27 PM CARE MANAGEMENT NOTE 01/13/2014  Patient:  [name], [name]   Account Number:  [id number]  Date Initiated:  01/12/2014  Documentation initiated by:  Junius Creamer  Subjective/Objective Assessment:   adm w bradycardia     Action/Plan:   from nsg facility   Anticipated DC Date:  01/13/2014   Anticipated DC Plan:  SKILLED NURSING FACILITY  In-house referral  Clinical Social Worker      DC Planning Services  CM consult      Choice offered to / List presented to:             Status of service:  Completed, signed off Medicare Important Message given?  YES (If response is "NO", the following Medicare IM given date fields will be blank) Date Medicare IM given:  01/13/2014 Medicare IM given by:  Letha Cape Date Additional Medicare IM given:   Additional Medicare IM given by:    Discharge Disposition:  SKILLED NURSING FACILITY  Per UR Regulation:  Reviewed for med. necessity/level of care/duration of stay  If discussed at Long Length of Stay Meetings, dates discussed:    Comments:  7/14 1000 debbie dowell rn,bsn alerted sw of adm.

## 2014-01-13 NOTE — Clinical Social Work Note (Signed)
Per Md patient ready to return to [facility]. RN, patient's niece, and facility made aware of discharge. DC packet on chart. Ambulance transport requested. CSW signing off.  Roddie Mc MSW, [place], [place], [phone]

## 2014-01-13 NOTE — Discharge Summary (Signed)
PATIENT DETAILS Name: [name] Age: 80 y.o. Sex: female Date of Birth: [birth year] MRN: [id number]. Admit Date: 01/10/2014 Admitting Physician: Kalman Shan, MD WUJ:[id number], Chancy Hurter, MD  Recommendations for Outpatient Follow-up:  1. Please have a palliative care consultation done while at skilled nursing facility 2. If patient continues to do well, consider speech therapy evaluation. 3. Patient is a DO NOT RESUSCITATE, per cardiology-not a candidate for further workup.  PRIMARY DISCHARGE DIAGNOSIS:  Active Problems:   Anemia   Type II or unspecified type diabetes mellitus with neurological manifestations, not stated as uncontrolled(250.60)   HTN (hypertension), malignant   Adult failure to thrive   Syncope and collapse   Complete heart block   STEMI (ST elevation myocardial infarction)   Hypothermia   Dementia      PAST MEDICAL HISTORY: Past Medical History  Diagnosis Date  . Stroke 2000 and 2002  . Hypertension   . Erosive gastritis 08/29/2011    Per EGD  . Hiatal hernia 08/29/2011    Per EGD  . Diverticulosis 08/30/2011    Per colonoscopy  . Peripheral neuropathy 08/30/2011  . 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 Subdural hematoma 08/2012    Status post fall  . Dysphagia, pharyngoesophageal phase 08/16/2012  . Type II or unspecified type diabetes mellitus with neurological manifestations, not stated as uncontrolled   . Acute left PCA stroke 08/16/2012  . CHF (congestive heart failure)   . Urinary tract infection, site not specified   . Muscle weakness (generalized)   . Mixed hyperlipidemia   . Embolism and thrombosis of unspecified artery   . Type II or unspecified type diabetes mellitus with neurological manifestations, not stated as uncontrolled   . Dysphagia, oral phase   . Vascular dementia, uncomplicated   . Altered mental status   . Anemia, unspecified   . 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 Acute, but ill-defined,  cerebrovascular disease   . Depressive disorder, not elsewhere classified   . Adult failure to thrive     DISCHARGE MEDICATIONS:   Medication List    STOP taking these medications       furosemide 20 MG tablet  Commonly known as:  LASIX     methylphenidate 5 MG tablet  Commonly known as:  RITALIN     potassium chloride SA 20 MEQ tablet  Commonly known as:  K-DUR,KLOR-CON      TAKE these medications       aspirin 81 MG chewable tablet  Chew 1 tablet (81 mg total) by mouth daily.     feeding supplement (GLUCERNA SHAKE) Liqd  Take 237 mLs by mouth 2 (two) times daily between meals.     LORazepam 2 MG/ML concentrated solution  Commonly known as:  ATIVAN  Take 0.5 mLs (1 mg total) by mouth every 4 (four) hours as needed for anxiety or sedation.     mirtazapine 7.5 MG tablet  Commonly known as:  REMERON  Take 7.5 mg by mouth at bedtime.     morphine CONCENTRATE 10 mg / 0.5 ml concentrated solution  Take 0.25 mLs (5 mg total) by mouth every 4 (four) hours as needed for moderate pain, severe pain or shortness of breath.     omeprazole 20 MG tablet  Commonly known as:  PRILOSEC OTC  Take 20 mg by mouth every morning.     simvastatin 20 MG tablet  Commonly known as:  ZOCOR  Take 20 mg by mouth daily.  ALLERGIES:  No Known Allergies  BRIEF HPI:  See H&P, Labs, Consult and Test reports for all details in brief, patient is a 80 year old female who was at the skilled nursing facility sustained a syncopal episode, was also found to be hypotensive and bradycardic. Apparently her initial rhythm was consistent with complete heart block and Mobitz type II. She was externally paced, transferred to [place] for further evaluation and treatment  CONSULTATIONS:   cardiology and pulmonary/intensive care  PERTINENT RADIOLOGIC STUDIES: Dg Hip Complete Right  12/27/2013   CLINICAL DATA:  Right hip pain after fall  EXAM: RIGHT HIP - COMPLETE 2+ VIEW  COMPARISON:   12/16/2006  FINDINGS: There is a ring of osteophytes around both femoral head neck junctions secondary to osteoarthritis. When accounting for these, there is no evidence of acute hip fracture. Both hips are located. No evidence of pelvic ring fracture or diastasis. Evaluation of the sacrum is limited by bowel gas and rectal stool. Diffuse arterial calcification. Osteopenia.  IMPRESSION: 1.  No visible hip or pelvis fracture. 2. Bilateral hip osteoarthritis.   Electronically Signed   By: Tiburcio Pea M.D.   On: 12/27/2013 21:20   Ct Head Wo Contrast  12/27/2013   CLINICAL DATA:  Patient status post fall. Hit right side of the forehead  EXAM: CT HEAD WITHOUT CONTRAST  CT MAXILLOFACIAL WITHOUT CONTRAST  TECHNIQUE: Multidetector CT imaging of the head and maxillofacial structures were performed using the standard protocol without intravenous contrast. Multiplanar CT image reconstructions of the maxillofacial structures were also generated.  COMPARISON:  Head CT 11/06/2012  FINDINGS: CT HEAD FINDINGS  Ventricles and sulci are prominent for age compatible with atrophy. Periventricular and subcortical white matter hypodensity compatible with chronic small vessel ischemic change. Ex vacuo dilatation of the lateral left ventricle secondary to overlying cortical atrophy within the left occipital lobe likely secondary to remote infarct. Large left basal ganglia lacunar infarct. No evidence for acute cortically based infarct, intracranial hemorrhage, mass effect or mass lesion. Extensive atherosclerotic change of the basilar artery. Paranasal sinuses are unremarkable. Mastoid air cells are well aerated  CT MAXILLOFACIAL FINDINGS  The maxilla and mandible are intact. The zygomatic arches are unremarkable. The orbits are unremarkable. No evidence for acute maxillofacial fracture.  IMPRESSION: 1. No evidence for acute maxillofacial fracture. 2. No acute intracranial process. 3. Remote lacune or infarcts.   Electronically  Signed   By: Annia Belt M.D.   On: 12/27/2013 20:41   Dg Chest Port 1 View  01/10/2014   CLINICAL DATA:  Chest pain, external pacer  EXAM: PORTABLE CHEST - 1 VIEW  COMPARISON:  Chest radiograph 11/06/2012  FINDINGS: There is a external artifact over the central chest. There is mild central venous pulmonary congestion. No overt pulmonary edema. No pneumothorax.  IMPRESSION: Central venous congestion.  No overt edema or pneumothorax.   Electronically Signed   By: Genevive Bi M.D.   On: 01/10/2014 12:54   Ct Maxillofacial Wo Cm  12/27/2013   CLINICAL DATA:  Patient status post fall. Hit right side of the forehead  EXAM: CT HEAD WITHOUT CONTRAST  CT MAXILLOFACIAL WITHOUT CONTRAST  TECHNIQUE: Multidetector CT imaging of the head and maxillofacial structures were performed using the standard protocol without intravenous contrast. Multiplanar CT image reconstructions of the maxillofacial structures were also generated.  COMPARISON:  Head CT 11/06/2012  FINDINGS: CT HEAD FINDINGS  Ventricles and sulci are prominent for age compatible with atrophy. Periventricular and subcortical white matter hypodensity compatible with chronic  small vessel ischemic change. Ex vacuo dilatation of the lateral left ventricle secondary to overlying cortical atrophy within the left occipital lobe likely secondary to remote infarct. Large left basal ganglia lacunar infarct. No evidence for acute cortically based infarct, intracranial hemorrhage, mass effect or mass lesion. Extensive atherosclerotic change of the basilar artery. Paranasal sinuses are unremarkable. Mastoid air cells are well aerated  CT MAXILLOFACIAL FINDINGS  The maxilla and mandible are intact. The zygomatic arches are unremarkable. The orbits are unremarkable. No evidence for acute maxillofacial fracture.  IMPRESSION: 1. No evidence for acute maxillofacial fracture. 2. No acute intracranial process. 3. Remote lacune or infarcts.   Electronically Signed   By: Annia Beltrew   Davis M.D.   On: 12/27/2013 20:41     PERTINENT LAB RESULTS: CBC:  Recent Labs  01/10/14 1232  WBC 5.9  HGB 10.5*  HCT 33.3*  PLT 240   CMET CMP     Component Value Date/Time   NA 141 01/11/2014 0417   K 4.2 01/11/2014 0417   CL 102 01/11/2014 0417   CO2 22 01/11/2014 0417   GLUCOSE 108* 01/11/2014 0417   BUN 10 01/11/2014 0417   CREATININE 0.82 01/11/2014 0417   CALCIUM 9.2 01/11/2014 0417   PROT 9.3* 11/06/2012 1430   ALBUMIN 4.1 11/06/2012 1430   AST 25 11/06/2012 1430   ALT 8 11/06/2012 1430   ALKPHOS 81 11/06/2012 1430   BILITOT 0.7 11/06/2012 1430   GFRNONAA 62* 01/11/2014 0417   GFRAA 72* 01/11/2014 0417    GFR Estimated Creatinine Clearance: 32.9 ml/min (by C-G formula based on Cr of 0.82). No results found for this basename: LIPASE, AMYLASE,  in the last 72 hours  Recent Labs  01/10/14 1650 01/10/14 2213 01/11/14 0417  TROPONINI 2.21* 9.27* 15.68*   No components found with this basename: POCBNP,  No results found for this basename: DDIMER,  in the last 72 hours No results found for this basename: HGBA1C,  in the last 72 hours No results found for this basename: CHOL, HDL, LDLCALC, TRIG, CHOLHDL, LDLDIRECT,  in the last 72 hours  Recent Labs  01/10/14 1650  TSH 4.230   No results found for this basename: VITAMINB12, FOLATE, FERRITIN, TIBC, IRON, RETICCTPCT,  in the last 72 hours Coags: No results found for this basename: PT, INR,  in the last 72 hours Microbiology: Recent Results (from the past 240 hour(s))  MRSA PCR SCREENING     Status: Abnormal   Collection Time    01/10/14  2:56 PM      Result Value Ref Range Status   MRSA by PCR POSITIVE (*) NEGATIVE Final   Comment:            The GeneXpert MRSA Assay (FDA     approved for NASAL specimens     only), is one component of a     comprehensive MRSA colonization     surveillance program. It is not     intended to diagnose MRSA     infection nor to guide or     monitor treatment for     MRSA infections.       RESULT CALLED TO, READ BACK BY AND VERIFIED WITH:     Armida SansMEREDITH SMITH RN AT [id number] 01/10/14 BY [facility]  URINE CULTURE     Status: None   Collection Time    01/10/14  5:20 PM      Result Value Ref Range Status   Specimen Description URINE, CATHETERIZED   Final  Special Requests NONE   Final   Culture  Setup Time     Final   Value: 01/10/2014 23:25     Performed at [facility] Count     Final   Value: 70,000 COLONIES/ML     Performed at [facility]   Culture     Final   Value: GRAM POSITIVE COCCI     Performed at [facility]   Report Status PENDING   Incomplete     BRIEF HOSPITAL COURSE:  Recent inferior MI complicated with complete heart block and second degree heart block - Patient was admitted, evaluated by cardiology. Given her overall state of health, it was felt patient was not a candidate for permanent pacemaker. Patient has very poor functional status and has advanced dementia. She was placed on aspirin. She was initially admitted by pulmonary critical care to the ICU, however was transferred to the hospitalist service. Cardiology has seen the patient on the day of discharge, their recommendations are to continue with just aspirin, given her history of complete heart block/secondary heart block not a candidate for beta blocker, given the borderline low blood pressure, she is not a candidate for ACE inhibitor. It is suggested that while at the skilled nursing facility, she received a palliative care consultation for further goals of care. Currently she is a DO NOT RESUSCITATE a month and as noted above, she is not a candidate for aggressive care.  Cardiogenic shock - On initial presentation, patient was hypotensive and bradycardic. This is in the setting of acute MI, and heart block. This has resolved.  Adult failure to thrive - Difficult situation, advanced dementia. Continue with supplements on discharge. Consider PT evaluation at skilled  nursing facility if continues to do well. Definitely will need goals of care meeting with the palliative care team while at skilled nursing facility.  Hypothermia - Results, seen on admission. Probably secondary to cardiogenic shock.  H/o PE s/p IVC filter, unable to anticoagulate secondary to recent spontaneous hemorrhage to hip abductor muscle  - Continues not to be a candidate for anticoagulation. Has IVC filter in place.  Suspected UTI - Was on empiric Rocephin, this has been discontinued. Will not be discharged on any further antibiotics. Plans are for nonaggressive care at this time.  Dysphagia - Seen by speech therapy, continue on dysphagia 1 diet. I have attempted to family, left numerous messages. For now, continue dysphagia 1 diet, will need speech therapy evaluation was at skilled nursing facility.  TODAY-DAY OF DISCHARGE:  Subjective:   [name] today remains a very pleasantly confused  Objective:   Blood pressure 104/63, pulse 72, temperature 99.7 F (37.6 C), temperature source Oral, resp. rate 20, height 5\' 6"  (1.676 m), weight 44 kg (97 lb), SpO2 99.00%.  Intake/Output Summary (Last 24 hours) at 01/13/14 1008 Last data filed at 01/12/14 1843  Gross per 24 hour  Intake 591.25 ml  Output     50 ml  Net 541.25 ml   Filed Weights   01/10/14 1125 01/11/14 0500  Weight: 51.71 kg (114 lb) 44 kg (97 lb)    Exam Awake Alert, Oriented *3, No new F.N deficits, Normal affect [place].AT,PERRAL Supple Neck,No JVD, No cervical lymphadenopathy appriciated.  Symmetrical Chest wall movement, Good air movement bilaterally, CTAB RRR,No Gallops,Rubs or new Murmurs, No Parasternal Heave +ve B.Sounds, Abd Soft, Non tender, No organomegaly appriciated, No rebound -guarding or rigidity. No Cyanosis, Clubbing or edema, No new Rash or bruise  DISCHARGE CONDITION: Stable  DISPOSITION: SNF  CODE STATUS: DO NOT RESUSCITATE  DISCHARGE INSTRUCTIONS:    Activity:  As  tolerated with Full fall precautions use walker/cane & assistance as needed  Diet recommendation: Dysphagia 1     Discharge Instructions   Diet - low sodium heart healthy    Complete by:  As directed   Dysphagia 1 (Puree);Thin liquid    Liquid Administration via: Cup;Straw Medication Administration: Whole meds with puree Supervision: Staff to assist with self feeding;Patient able to self feed;Full supervision/cueing for compensatory strategies Compensations: Slow rate;Small sips/bites;Check for pocketing Postural Changes and/or Swallow Maneuvers: Seated upright 90 degrees;Upright 30-60 min after meal     Increase activity slowly    Complete by:  As directed            Follow-up Information   Follow up with Colette Ribas, MD. Schedule an appointment as soon as possible for a visit in 1 week.   Specialty:  Family Medicine   Contact information:   [address] [place] [address] [phone]      Total Time spent on discharge equals 45 minutes.  SignedJeoffrey Massed 01/13/2014 10:08 AM  **Disclaimer: This note may have been dictated with voice recognition software. Similar sounding words can inadvertently be transcribed and this note may contain transcription errors which may not have been corrected upon publication of note.**

## 2014-01-15 LAB — URINE CULTURE

## 2014-01-30 DEATH — deceased

## 2014-11-04 IMAGING — CT CT ANGIO CHEST
2 of 7 series · 4 of 46 positions shown · IV contrast (Omnipaque 300)
Comparison: Chest x-ray 10/05/2012.  CT abdomen and pelvis
05/30/2009.

CTA CHEST

***ADDENDUM*** CREATED: 10/05/2012 [DATE]

Critical Value/emergent results were called by telephone at the
time of interpretation on 10/05/2012 at [DATE] p.m. to Dr. Beejay,
who verbally acknowledged these results.
***END ADDENDUM*** SIGNED BY: Nivirus [employer]abex, M.D.
CLINICAL DATA: Chest pain.  Lower mid to upper back pain.
CT ANGIOGRAPHY CHEST AND ABDOMEN
TECHNIQUE: Multidetector CT imaging of the chest and abdomen was
performed using the standard protocol during bolus administration
of intravenous contrast.  Multiplanar CT image reconstructions
including MIPs were obtained to evaluate the vascular anatomy.
Contrast: 100mL OMNIPAQUE IOHEXOL 350 MG/ML SOLN

[Series 5: dissection 3.0 b40f · axial · 0.73mm/px · z∈[+542,+848]mm · 3 of 206 slices shown]
[im 52/206  lung]
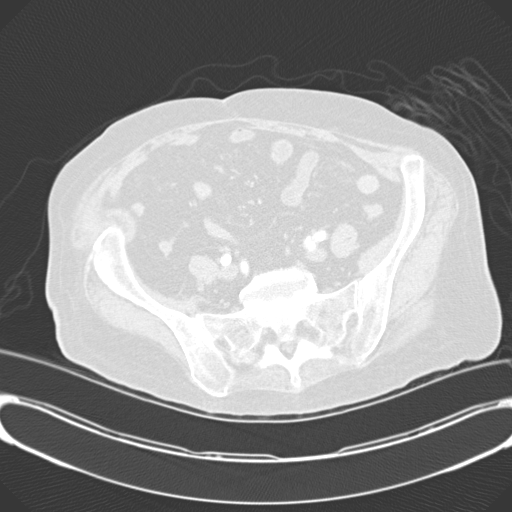
[im 103/206  soft-tissue]
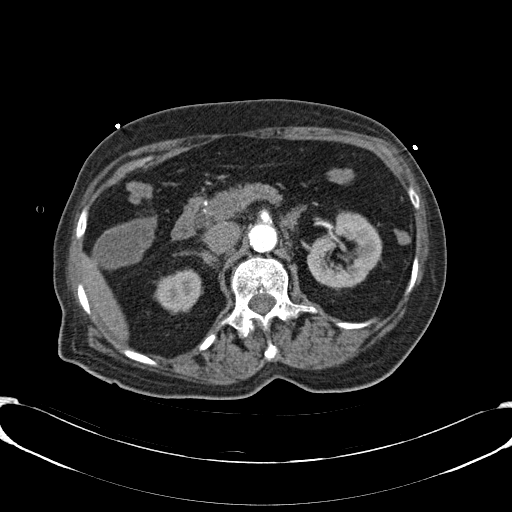
[im 154/206  lung]
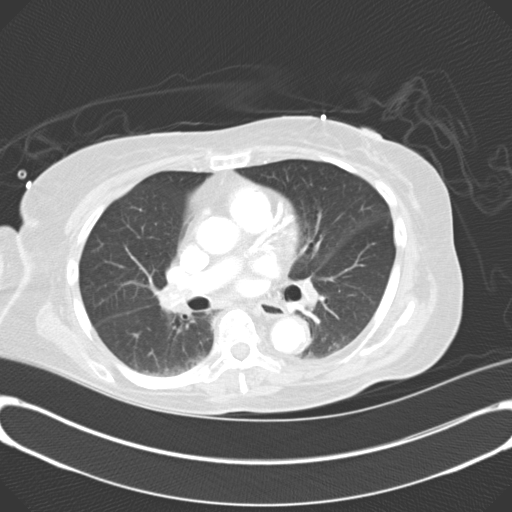

[Series 7: mpr cor post contrast · coronal · 0.79mm/px · 1 of 86 slices shown]
[im 43/86  soft-tissue]
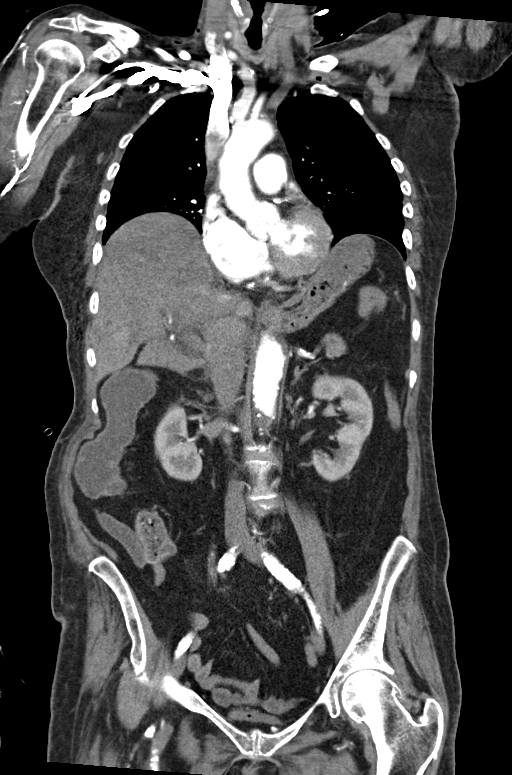

[4 of 46 positions shown; findings below may reference images not displayed]

FINDINGS: Large pulmonary emboli are evident at the bifurcations
of the right than the left main pulmonary artery extending into the
lobar arteries bilaterally.  Thrombotic burden is symmetric.

The thoracic aorta is tortuous.  Atherosclerotic calcifications are
present within the thoracic aorta.  A standard three-vessel arch
configuration is present.  There is no significant aneurysm or
dissection.  There is no significant stenosis of the origins of the
great vessels.  The heart size is normal.  No significant pleural
or pericardial effusion is evident.

Mild bronchiectasis is evident bilaterally.  Minimal dependent
atelectasis is worse on the right.  No focal nodule, mass, or
airspace disease is present.

The bone windows are unremarkable. The noncontrast study
demonstrates no displaced calcifications.

 Review of the MIP images confirms the above findings.
IMPRESSION: 1.  Prominent bilateral pulmonary emboli with extensive thrombus at
the right and left main pulmonary artery bifurcations extending
into the lobar arteries on both sides.
2.  Atherosclerotic changes in the aorta without evidence for acute
dissection or aneurysm.
3.  Standard three-vessel arch configuration.

CTA ABDOMEN
FINDINGS: Atherosclerotic calcifications and mural plaque present
within the abdominal aorta without aneurysm or dissection.  The
iliac arteries are normal size but with extensive atherosclerotic
calcifications as well.  The celiac artery and superior mesenteric
artery are patent.  The renal artery ostia are patent.  The
inferior mesenteric artery is opacified.

The liver and spleen are within normal limits.  The stomach,
duodenum, and pancreas are normal.  The common bile duct is within
normal limits following cholecystectomy.  Adrenal glands are normal
bilaterally.  The kidneys and ureters are unremarkable.  A Foley
catheter is present within the urinary bladder.  The patient is
status post hysterectomy.  The ovaries are not clearly visualized
and may be surgically absent.

The rectosigmoid colon is mostly collapsed.  Scattered diverticular
changes are present without focal inflammation to suggest acute
diverticulitis.  The ascending colon is within normal limits.  The
patient is status post cholecystectomy.  The small bowel is
unremarkable.  No significant adenopathy or free fluid is present.

Bone windows demonstrates degenerative disc disease at L4-5 and
multilevel facet degenerative change.  No focal lytic or blastic
lesions are evident

 Review of the MIP images confirms the above findings.
IMPRESSION: 1.  Atherosclerotic changes throughout the abdominal aorta branch
vessels without aneurysm.
2.  Multilevel degenerative changes in the lumbar spine.

## 2014-11-10 IMAGING — CR DG HIP (WITH OR WITHOUT PELVIS) 2-3V*L*
3 series · 3 of 3 positions shown · non-contrast
Comparison: CT 10/05/2012 and 10/11/2012

CLINICAL DATA: Left hip pain.

LEFT HIP - COMPLETE 2+ VIEW

[view not recorded (1 of 3)]
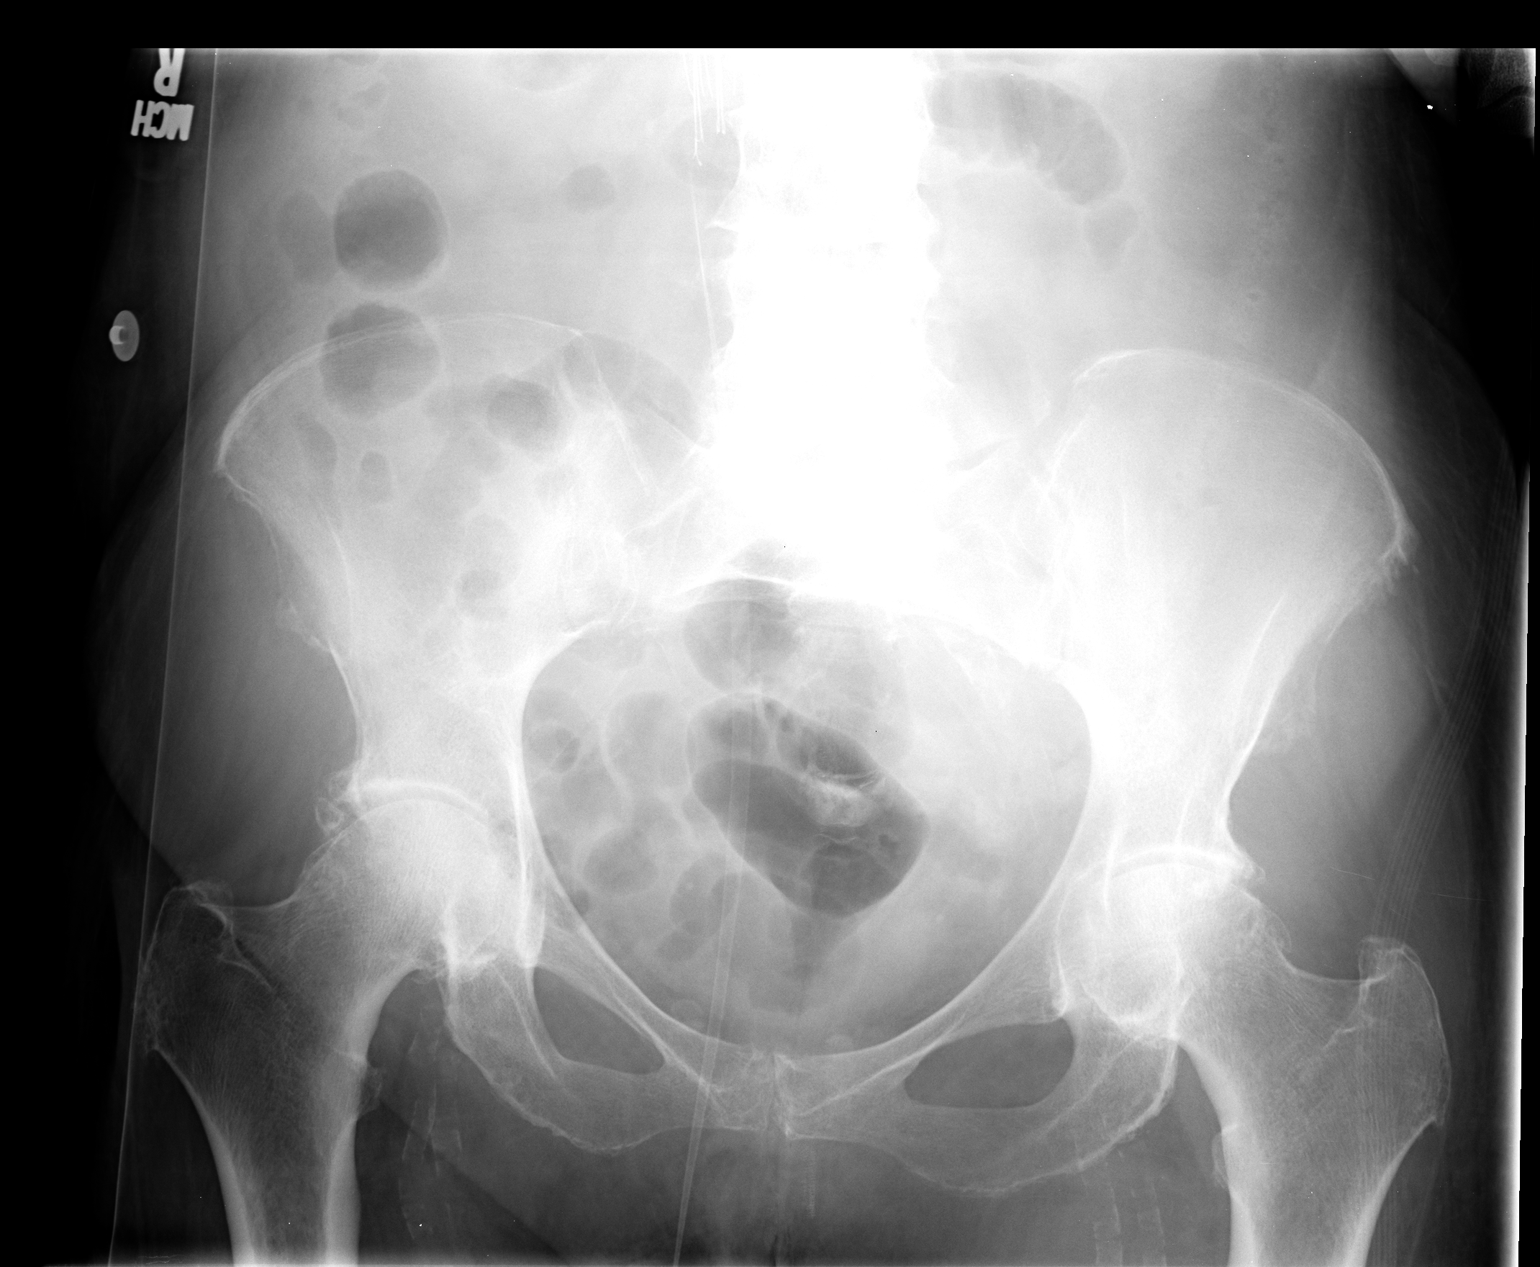

[view not recorded (2 of 3)]
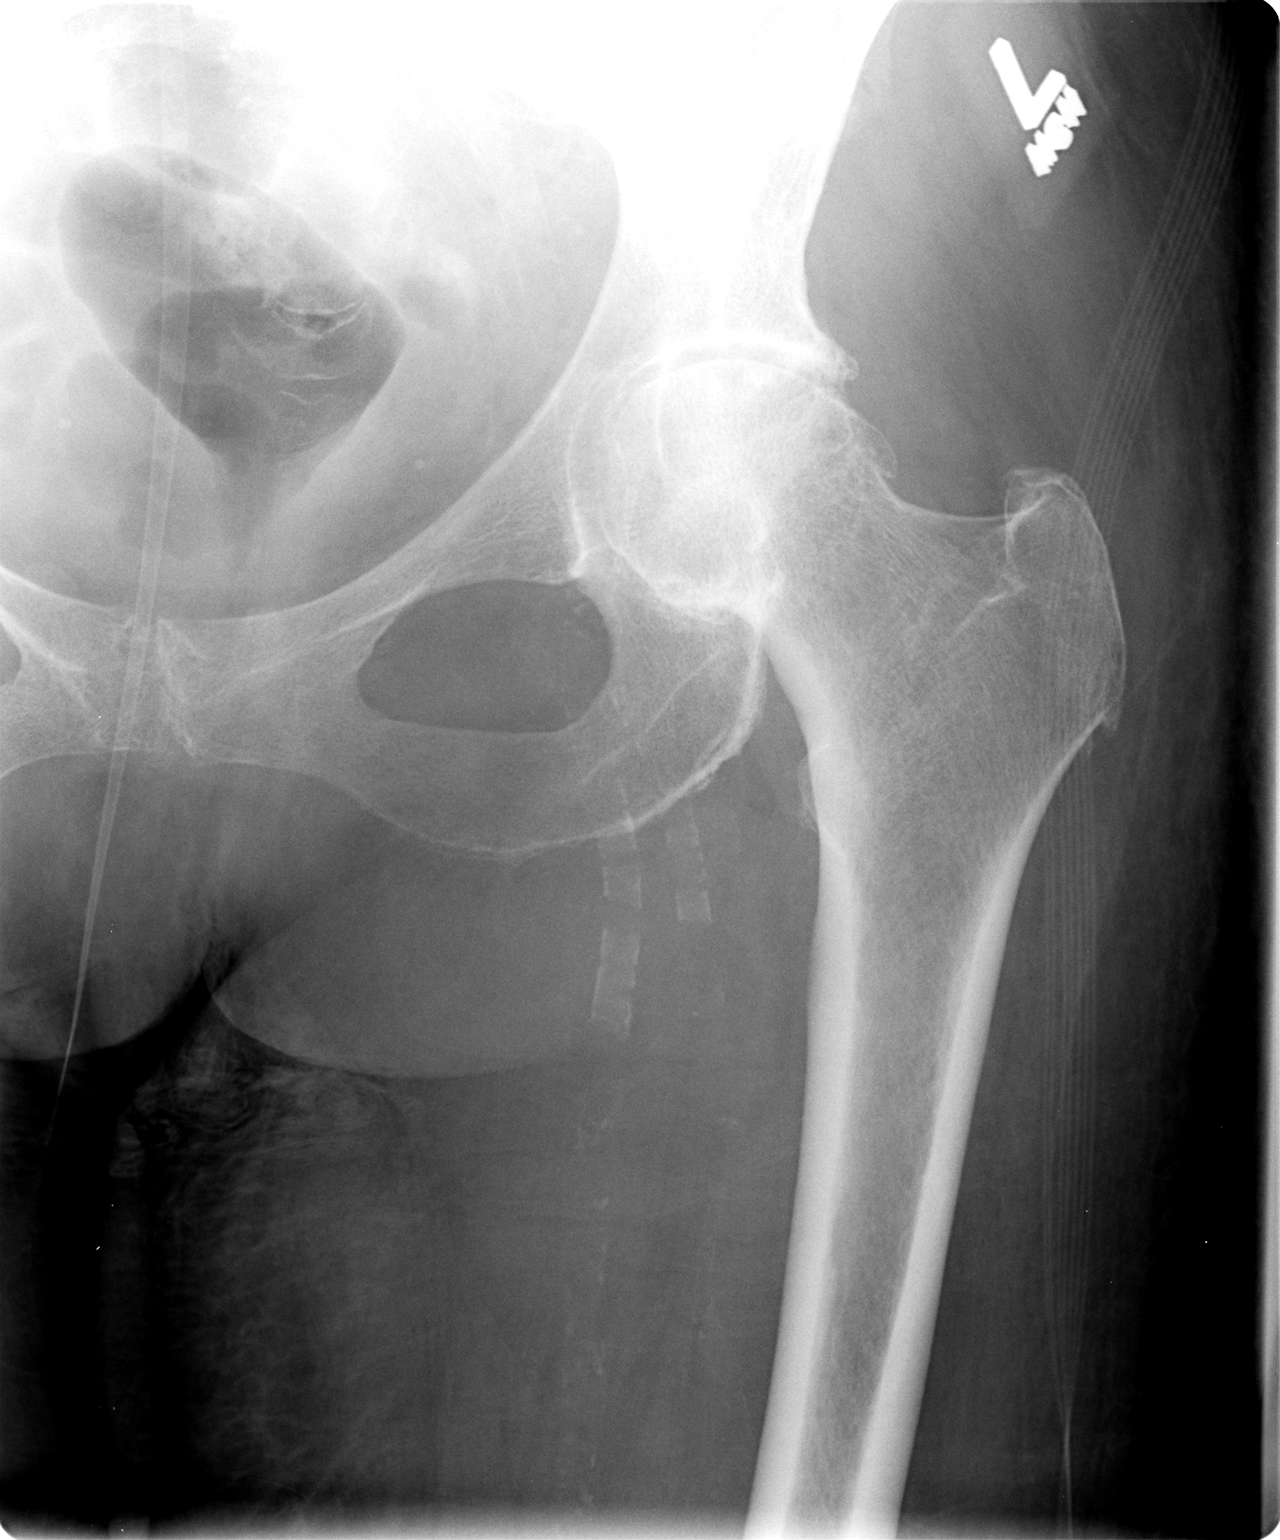

[view not recorded (3 of 3)]
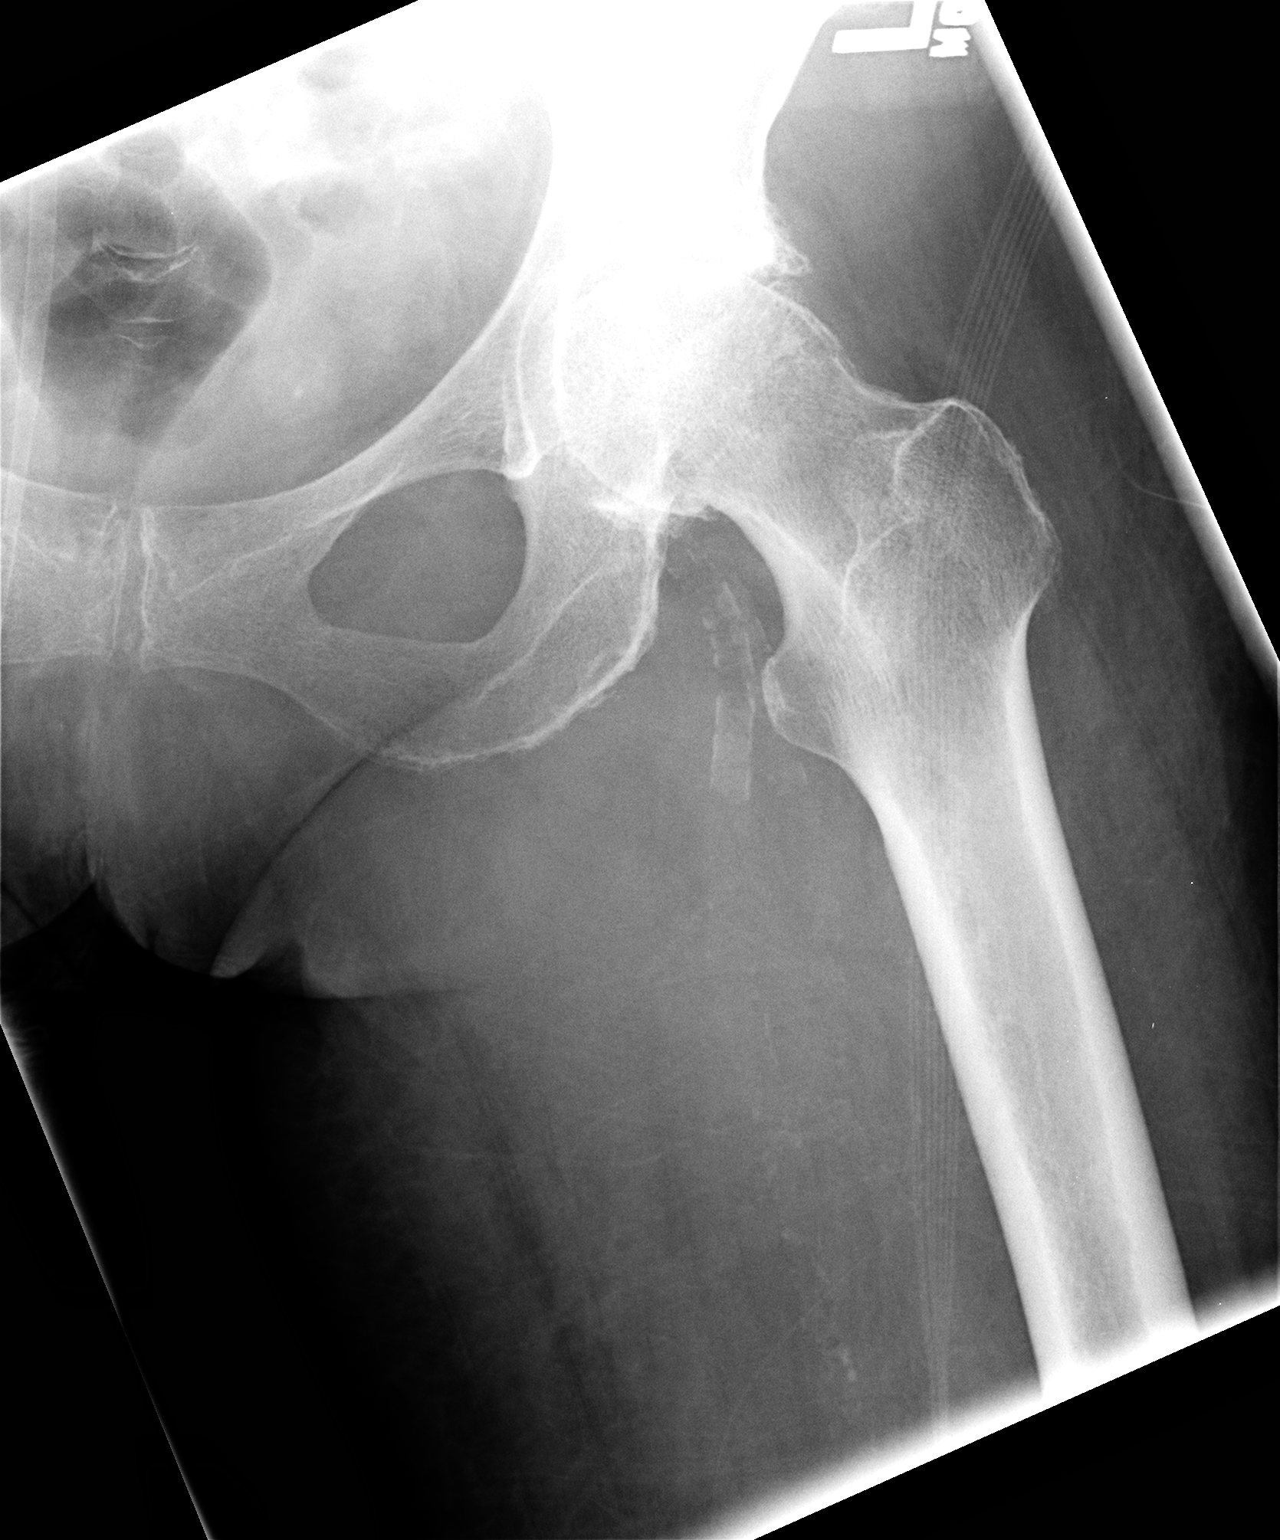

[3 of 3 positions shown; findings below may reference images not displayed]

FINDINGS: Pelvic bony ring is intact.  Degenerative changes in the
hips bilaterally.  No evidence for acute fracture or dislocation.
The patient has an IVC filter.  Joint space narrowing along the
medial and inferior left hip joint.
IMPRESSION: No acute bony abnormality in the pelvis or left hip.

Degenerative changes in both hips.
